# Patient Record
Sex: Female | Born: 1980
Health system: Southern US, Community
[De-identification: ages and names within clinical notes are randomized; demographics above are authoritative.]

## PROBLEM LIST (undated history)

## (undated) ENCOUNTER — Inpatient Hospital Stay (HOSPITAL_COMMUNITY): Payer: Self-pay

## (undated) DIAGNOSIS — O24419 Gestational diabetes mellitus in pregnancy, unspecified control: Secondary | ICD-10-CM

## (undated) DIAGNOSIS — F329 Major depressive disorder, single episode, unspecified: Secondary | ICD-10-CM

## (undated) DIAGNOSIS — K219 Gastro-esophageal reflux disease without esophagitis: Secondary | ICD-10-CM

## (undated) DIAGNOSIS — R87619 Unspecified abnormal cytological findings in specimens from cervix uteri: Secondary | ICD-10-CM

## (undated) DIAGNOSIS — I10 Essential (primary) hypertension: Secondary | ICD-10-CM

## (undated) DIAGNOSIS — F32A Depression, unspecified: Secondary | ICD-10-CM

## (undated) DIAGNOSIS — R000 Tachycardia, unspecified: Secondary | ICD-10-CM

## (undated) DIAGNOSIS — F419 Anxiety disorder, unspecified: Secondary | ICD-10-CM

## (undated) DIAGNOSIS — IMO0002 Reserved for concepts with insufficient information to code with codable children: Secondary | ICD-10-CM

## (undated) DIAGNOSIS — Z8489 Family history of other specified conditions: Secondary | ICD-10-CM

## (undated) DIAGNOSIS — G971 Other reaction to spinal and lumbar puncture: Secondary | ICD-10-CM

## (undated) HISTORY — DX: Major depressive disorder, single episode, unspecified: F32.9

## (undated) HISTORY — DX: Essential (primary) hypertension: I10

## (undated) HISTORY — PX: PERIURETHRAL ABSCESS DRAINAGE: SUR423

## (undated) HISTORY — PX: LEEP: SHX91

## (undated) HISTORY — DX: Gestational diabetes mellitus in pregnancy, unspecified control: O24.419

## (undated) HISTORY — DX: Depression, unspecified: F32.A

## (undated) HISTORY — DX: Anxiety disorder, unspecified: F41.9

## (undated) HISTORY — PX: WISDOM TOOTH EXTRACTION: SHX21

---
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
## 2002-03-04 ENCOUNTER — Other Ambulatory Visit: Admission: RE | Admit: 2002-03-04 | Discharge: 2002-03-04 | Payer: Self-pay | Admitting: Obstetrics and Gynecology

## 2003-04-18 ENCOUNTER — Emergency Department (HOSPITAL_COMMUNITY): Admission: EM | Admit: 2003-04-18 | Discharge: 2003-04-18 | Payer: Self-pay | Admitting: Emergency Medicine

## 2004-01-02 ENCOUNTER — Emergency Department (HOSPITAL_COMMUNITY): Admission: EM | Admit: 2004-01-02 | Discharge: 2004-01-02 | Payer: Self-pay | Admitting: Emergency Medicine

## 2004-02-28 ENCOUNTER — Emergency Department (HOSPITAL_COMMUNITY): Admission: EM | Admit: 2004-02-28 | Discharge: 2004-02-29 | Payer: Self-pay | Admitting: Emergency Medicine

## 2004-03-02 ENCOUNTER — Emergency Department (HOSPITAL_COMMUNITY): Admission: EM | Admit: 2004-03-02 | Discharge: 2004-03-02 | Payer: Self-pay | Admitting: Emergency Medicine

## 2004-03-10 ENCOUNTER — Emergency Department (HOSPITAL_COMMUNITY): Admission: EM | Admit: 2004-03-10 | Discharge: 2004-03-10 | Payer: Self-pay | Admitting: Emergency Medicine

## 2007-03-15 ENCOUNTER — Emergency Department: Payer: Self-pay | Admitting: Emergency Medicine

## 2007-03-17 ENCOUNTER — Emergency Department (HOSPITAL_COMMUNITY): Admission: EM | Admit: 2007-03-17 | Discharge: 2007-03-17 | Payer: Self-pay | Admitting: Emergency Medicine

## 2007-03-20 ENCOUNTER — Emergency Department (HOSPITAL_COMMUNITY): Admission: EM | Admit: 2007-03-20 | Discharge: 2007-03-20 | Payer: Self-pay | Admitting: Emergency Medicine

## 2010-09-24 NOTE — Discharge Summary (Signed)
[facility]  Patient:    Tami Foster, Tami Foster Visit Number: [id number] MRN: [id number]          Service Type: PSY Location: 10 0100 01 Attending Physician:  Veneta Penton. Admit Date:  01/12/2001 Discharge Date: 01/14/2001                             Discharge Summary  IDENTIFICATION:  Tami Foster was a 30 year old female.  INITIAL ASSESSMENT AND DIAGNOSIS:  [name] was admitted to the hospital after taking an overdose of Xanax.  She said she took about three tablets.  She was at a friends house and had to be picked up because she was acting erratic, appearing to be drunk, and slurring her speech.  She was taken to the hospital and subsequently admitted.  She said she was not suicidal at the time, but had been upset.  She and her boyfriend that she had been off and on boyfriend/girlfriend with for the last year or so, had had a conversation with earlier in the week and were planning to get together.  She said she was tired and could not go out with him.  That night, he got killed in an accident.  She said it had been very hard dealing with that.  That was about a week prior to admission.  She found herself getting irritable, angry, and snapping at people.  She had an argument at home and overreacted to that, and left home to be with her friend.  By the time of talking with her in the hospital, she said in spite of her unhappiness, she had a lot to live for and has had no interest in dying.  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  She was looking forward to college.  She got along with her parents and was close to her mother.  MENTAL STATUS EXAMINATION:  At the time of the initial evaluation, revealed and alert, oriented young woman who came to the interview willingly and was cooperative.  She was appropriately dressed and groomed.  She denied any suicidal thoughts.  She admitted to taking the extra Xanax, wanting just to escape her feelings,  she said.  There was no evidence of any psychosis.  Short and long-term memory were intact.  Judgment currently seemed adequate. Insight was minimal, intellectual functioning seemed at least average. Concentration was adequate.  OTHER PERTINENT HISTORY:  Can be obtained from the psychosocial service summary.  PHYSICAL EXAMINATION:  Within normal limits.  ADMISSION DIAGNOSES: Axis I:    1. Depressive disorder, not otherwise specified.            2. Grief reaction. Axis II:   Deferred. Axis III:  Healthy. Axis IV:   Severe. Axis V:    50/75.  FINDINGS:  All indicate her laboratory examinations were within normal limits or were noncontributory.  HOSPITAL COURSE:  Kynedi was in the hospital briefly.  She consistently denied any suicidal thoughts.  Initially, she focused mostly on just getting out of the hospital.  Her mother indicated she wanted her to at least take some responsibility for her behavior and talked about what she needed to do to change.  Her mother was understanding of what she was going through, but at the same time said this way of handling it was not necessary and wanted Sarin to do some thinking.  Rebekka did talk with her mother, did talk with staff, and at  least seemed to be taking some responsibility.  I was confident she was not suicidal as was her mother, and consequently, she was discharged to home.  POST HOSPITAL CARE PLAN:  She will follow up with her mothers EAP.  She was not given any medications in the hospital, nor prescribed any at the time of discharge.  There were no restrictions placed on her activity or her diet.  FINAL DIAGNOSES: Axis I:    Adjustment disorder with mixed emotional disturbance. Axis II:   Deferred. Axis III:  Healthy. Axis IV:   Severe. Axis V:    55. Attending Physician:  Veneta Penton DD:  01/23/01 TD:  01/23/01 Job: [id number]

## 2010-09-24 NOTE — H&P (Signed)
[facility]  Patient:    Tami Foster, LANGILL Visit Number: [id number] MRN: [id number]          Service Type: Attending:  Carolanne Grumbling, M.D. Dictated by:   Carolanne Grumbling, M.D. Adm. Date:  01/12/01                     Psychiatric Admission Assessment  IDENTIFICATION:  Tami Foster is a 30 year old female.  CHIEF COMPLAINT:  Tami Foster was admitted to the hospital after apparently taking Xanax.  She says she thinks she took about three tablets.  She was at a friends house and had to be picked up by her parents because she was appearing to be drunk and slurring her speech.  They brought her to the hospital and she was subsequently admitted.  HISTORY OF PRESENT ILLNESS:  Tami Foster says she was not suicidal but she says she has been upset.  Her boyfriend, that she has been off and on with for the last year or so, was killed in an accident.  She was supposed to go out with him that night.  They had been apart for awhile and he asked her to go out and she said it was too late but to call her the next day.  That very night, apparently, he got killed and she said it has been very hard dealing with that.  She has missed school for most of last week because of it.  She has found herself being irritable and angry and snapping at people.  She says she did have an argument and then she overreacted and left the home apparently to be with the friend.  She said she did not intend to take an overdose and she has no interest in dying.  Even though she is upset, she has plenty to live for.  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  She wants to go to college.  She says she gets along with her mother and father. She is very close to her mother.  She does not particularly like her father. She says he is lazy and does not work and she resents that every day.  She has an older sister, age 11, an older brother, age 60, and two older half-sisters on her  fathers side.  She is close to the sister particularly.  Not that close to the brother and has a reasonable relationship with her half-sisters and their children.  She denied any history of abuse, physically or sexually.  PREVIOUS PSYCHIATRIC TREATMENT:  None was reported.  MEDICAL PROBLEMS/ALLERGIES/MEDICATIONS:  She reported no medical problems.  No known allergies.  She takes birth control pills.  LEGAL/SUBSTANCE ABUSE ISSUES:  She denied any legal issues.  She has tried alcohol and marijuana but does not use either regularly.  She is a regular smoker of cigarettes.  MENTAL STATUS:  At the time of the initial evaluation revealed and alert, oriented, young woman, who came to the interview willingly and was cooperative.  Her main focus was on getting out of the hospital.  She sees no need to be here and cannot see why she even has to stay one more day.  She denied any suicidal thoughts prior to admission or since.  She did not display any evidence of any psychotic thinking or behavior.  Short and long-term memory were intact as measured by her ability to recall recent and remote events in her own life.  Her judgment currently seemed adequate.  Insight was minimal.  Intellectual functioning seemed at least average.  She admitted to feeling depressed, she said, even before the boyfriend got killed in an accident but she has been even more depressed since that time but she understands that is the way grief goes.  She was tearful throughout the session and says she does not know why she feels so bad.  ASSETS:  Tami Foster is intelligent and talkative.  ADMITTING DIAGNOSES: Axis I:    1. Depressive disorder not otherwise specified.            2. Grief. Axis II:   Deferred. Axis III:  Healthy. Axis IV:   Severe. Axis V:    50/75.  ESTIMATED LENGTH OF STAY:  Three days.  PLAN:  To stabilize to the point where she has a plan for dealing with her stress more effectively and a plan that is  acceptable to her parents. Dictated by:   Carolanne Grumbling, M.D. Attending:  Carolanne Grumbling, M.D. DD:  01/13/01 TD:  01/13/01 Job: [id number]

## 2010-11-18 ENCOUNTER — Emergency Department (HOSPITAL_COMMUNITY): Payer: Self-pay

## 2010-11-18 ENCOUNTER — Emergency Department (HOSPITAL_COMMUNITY)
Admission: EM | Admit: 2010-11-18 | Discharge: 2010-11-18 | Disposition: A | Discharge status: Home / Self Care | Payer: Self-pay | Attending: Emergency Medicine | Admitting: Emergency Medicine

## 2010-11-18 DIAGNOSIS — M25529 Pain in unspecified elbow: Secondary | ICD-10-CM | POA: Insufficient documentation

## 2010-11-18 DIAGNOSIS — L255 Unspecified contact dermatitis due to plants, except food: Secondary | ICD-10-CM | POA: Insufficient documentation

## 2010-11-18 DIAGNOSIS — S5000XA Contusion of unspecified elbow, initial encounter: Secondary | ICD-10-CM | POA: Insufficient documentation

## 2010-11-18 DIAGNOSIS — IMO0002 Reserved for concepts with insufficient information to code with codable children: Secondary | ICD-10-CM | POA: Insufficient documentation

## 2011-02-15 LAB — RAPID STREP SCREEN (MED CTR MEBANE ONLY): Streptococcus, Group A Screen (Direct): NEGATIVE

## 2012-04-14 ENCOUNTER — Inpatient Hospital Stay (HOSPITAL_COMMUNITY)
Admission: AD | Admit: 2012-04-14 | Discharge: 2012-04-15 | Disposition: A | Discharge status: Home / Self Care | Payer: Medicaid Other | Source: Ambulatory Visit | Attending: Obstetrics & Gynecology | Admitting: Obstetrics & Gynecology

## 2012-04-14 ENCOUNTER — Inpatient Hospital Stay (HOSPITAL_COMMUNITY): Payer: Medicaid Other

## 2012-04-14 ENCOUNTER — Encounter (HOSPITAL_COMMUNITY): Payer: Self-pay | Admitting: *Deleted

## 2012-04-14 DIAGNOSIS — O200 Threatened abortion: Secondary | ICD-10-CM | POA: Insufficient documentation

## 2012-04-14 LAB — HCG, QUANTITATIVE, PREGNANCY: hCG, Beta Chain, Quant, S: 510 m[IU]/mL — ABNORMAL HIGH (ref <5)

## 2012-04-14 LAB — CBC
HCT: 38.3 % (ref 36.0–46.0)
MCH: 31.5 pg (ref 26.0–34.0)
Platelets: 224 10*3/uL (ref 150–400)
RBC: 4.06 MIL/uL (ref 3.87–5.11)
WBC: 9.6 10*3/uL (ref 4.0–10.5)

## 2012-04-14 LAB — URINALYSIS, ROUTINE W REFLEX MICROSCOPIC
Bilirubin Urine: NEGATIVE
Glucose, UA: NEGATIVE mg/dL
Protein, ur: NEGATIVE mg/dL
Specific Gravity, Urine: 1.02 (ref 1.005–1.030)
Urobilinogen, UA: 0.2 mg/dL (ref 0.0–1.0)

## 2012-04-14 LAB — URINE MICROSCOPIC-ADD ON

## 2012-04-14 LAB — POCT PREGNANCY, URINE: Preg Test, Ur: POSITIVE — AB

## 2012-04-14 NOTE — MAU Note (Signed)
Today at 1300 had brown d/c. About 2000 saw alittle bright blood but now there is more blood

## 2012-04-14 NOTE — MAU Provider Note (Signed)
History     CSN: [id number]  Arrival date and time: 04/14/12 2049   First Provider Initiated Contact with Patient 04/14/12 2212      Chief Complaint  Patient presents with  . Vaginal Bleeding  . Abdominal Cramping   HPI  Tami Foster is a 31 y.o. G1P0 who presents today with vaginal bleeding. She has some brown spotting after intercourse yesterday. Today the bleeding has increased. She is having some mild lower back pain 5/10. She has not taken anything for it. She saw the RN at [facility] yesterday. She had routine prenatal labs done at that visit.   History reviewed. No pertinent past medical history.  Past Surgical History  Procedure Date  . Periurethral abscess drainage     abscess removed from tonsils. Tonsils not removed  . Leep     Family History  Problem Relation Age of Onset  . Other Neg Hx     History  Substance Use Topics  . Smoking status: Current Some Day Smoker  . Smokeless tobacco: Not on file  . Alcohol Use: No    Allergies: No Known Allergies  Prescriptions prior to admission  Medication Sig Dispense Refill  . calcium carbonate (TUMS - DOSED IN MG ELEMENTAL CALCIUM) 500 MG chewable tablet Chew 2 tablets by mouth daily as needed. For heartburn      . Prenatal Vit-Fe Fumarate-FA (PRENATAL MULTIVITAMIN) TABS Take 1 tablet by mouth daily.        Review of Systems  Constitutional: Negative for fever.  Gastrointestinal: Positive for nausea. Negative for heartburn, vomiting, abdominal pain, diarrhea and constipation.  Genitourinary: Negative for dysuria, urgency, frequency, hematuria and flank pain.  Musculoskeletal: Negative for myalgias.  Neurological: Negative for headaches.   Physical Exam   Blood pressure 119/80, pulse 101, temperature 98.1 F (36.7 C), temperature source Oral, resp. rate 20, height 5\' 2"  (1.575 m), weight 70.489 kg (155 lb 6.4 oz), last menstrual period 03/01/2012.  Physical Exam  Nursing note and vitals  reviewed. Constitutional: She is oriented to person, place, and time. She appears well-developed and well-nourished.  HENT:  Head: Normocephalic.  Cardiovascular: Normal rate.   Respiratory: Effort normal.  GI: Soft.  Neurological: She is alert and oriented to person, place, and time.  Skin: Skin is warm and dry.  Psychiatric: She has a normal mood and affect.    MAU Course  Procedures  *RADIOLOGY REPORT*  Clinical Data: Vaginal bleeding. 6 weeks and 2 days pregnant by  last menstrual period. Quantitative beta HCG 510.  OBSTETRIC <14 WK [place] AND TRANSVAGINAL OB US  Technique: Both transabdominal and transvaginal ultrasound  examinations were performed for complete evaluation of the  gestation as well as the maternal uterus, adnexal regions, and  pelvic cul-de-sac. Transvaginal technique was performed to assess  early pregnancy.  Comparison: None.  Intrauterine gestational sac: Not visualized.  Yolk sac: Not visualized.  Embryo: Not visualized.  Maternal uterus/adnexae:  Small linear, hypoechoic area within the endometrial canal. No  adnexal masses or free peritoneal fluid. Normal appearing ovaries.  IMPRESSION:  Minimal blood within the endometrial canal with no intrauterine or  ectopic pregnancy visualized. Correlation with serial quantitative  beta HCG determinations is recommended. If the beta HCG rises, a  followup ultrasound would be recommended in 2 weeks.  Original Report Authenticated By: Beckie Salts, M.D.  2300: Spoke with Dr. Tamela Oddi. OK for DC home and FU with the office next week.   Assessment and Plan   1. Threatened abortion in  first trimester    Discussed SAB precautions/bleeding precautions FU with Dr. Tamela Oddi next week.   Tawnya Crook 04/14/2012, 10:17 PM

## 2012-04-15 LAB — ABO/RH: ABO/RH(D): B POS

## 2012-04-15 NOTE — MAU Note (Signed)
Zorita Pang CNM in to discuss u/s results and d/c plan.

## 2012-04-15 NOTE — Progress Notes (Signed)
Written and verbal d/c instructions given and understanding voiced. 

## 2012-05-09 NOTE — L&D Delivery Note (Signed)
Delivery Note At 5:01 AM a viable female was delivered via Vaginal, Spontaneous Delivery (Presentation: LOA; compound presentation with posterior arm ).  APGAR: 9, 9 .   Placenta status: Intact, Spontaneous Pathology.  Cord: 3 vessels Anesthesia: Epidural  Episiotomy: None Lacerations: 1st degree, labial  Suture Repair: 2.0 vicryl rapide Est. Blood Loss (mL): 200 ml  Mom to postpartum.  Baby to NICU.  Foster,Tami Calvo A 02/28/2013, 5:38 AM

## 2012-08-03 ENCOUNTER — Telehealth: Payer: Self-pay | Admitting: *Deleted

## 2012-08-03 NOTE — Telephone Encounter (Signed)
Pt called requesting an appointment- she has h/o miscarriage.  Call to patient - left massage to call back.

## 2012-08-08 ENCOUNTER — Inpatient Hospital Stay (HOSPITAL_COMMUNITY)
Admission: AD | Admit: 2012-08-08 | Discharge: 2012-08-08 | Disposition: A | Discharge status: Home / Self Care | Payer: Medicaid Other | Source: Ambulatory Visit | Attending: Obstetrics & Gynecology | Admitting: Obstetrics & Gynecology

## 2012-08-08 ENCOUNTER — Inpatient Hospital Stay (HOSPITAL_COMMUNITY): Payer: Medicaid Other

## 2012-08-08 ENCOUNTER — Encounter (HOSPITAL_COMMUNITY): Payer: Self-pay | Admitting: *Deleted

## 2012-08-08 DIAGNOSIS — O200 Threatened abortion: Secondary | ICD-10-CM

## 2012-08-08 DIAGNOSIS — R109 Unspecified abdominal pain: Secondary | ICD-10-CM | POA: Insufficient documentation

## 2012-08-08 HISTORY — DX: Unspecified abnormal cytological findings in specimens from cervix uteri: R87.619

## 2012-08-08 HISTORY — DX: Reserved for concepts with insufficient information to code with codable children: IMO0002

## 2012-08-08 LAB — URINALYSIS, ROUTINE W REFLEX MICROSCOPIC
Hgb urine dipstick: NEGATIVE
Leukocytes, UA: NEGATIVE
pH: 6.5 (ref 5.0–8.0)

## 2012-08-08 LAB — CBC
HCT: 39.1 % (ref 36.0–46.0)
MCH: 31 pg (ref 26.0–34.0)
MCHC: 33.8 g/dL (ref 30.0–36.0)
MCV: 91.8 fL (ref 78.0–100.0)
RBC: 4.26 MIL/uL (ref 3.87–5.11)
RDW: 12.9 % (ref 11.5–15.5)

## 2012-08-08 LAB — WET PREP, GENITAL
Trich, Wet Prep: NONE SEEN
Yeast Wet Prep HPF POC: NONE SEEN

## 2012-08-08 LAB — HCG, QUANTITATIVE, PREGNANCY: hCG, Beta Chain, Quant, S: 6044 m[IU]/mL — ABNORMAL HIGH (ref <5)

## 2012-08-08 NOTE — MAU Note (Signed)
Pt reports lower abd cramping and brown discharge this pm. States also having lower back pain.

## 2012-08-08 NOTE — MAU Provider Note (Signed)
History     CSN: [id number]  Arrival date and time: 08/08/12 7829   First Provider Initiated Contact with Patient 08/08/12 2053      Chief Complaint  Patient presents with  . Abdominal Pain   HPI  NIALAH SARAVIA is a 32 y.o. G2P0010 at [redacted]w[redacted]d who presents today with some brown spotting since earlier today. She had a +UPT on 2/24, and had tested earlier in that month with a negative. So, she feels that she is not as far along as the dates listed from her LMP. She denies pain. She has had some occasional cramps that she thinks has been gas.   Past Medical History  Diagnosis Date  . Abnormal Pap smear     Past Surgical History  Procedure Laterality Date  . Periurethral abscess drainage      abscess removed from tonsils. Tonsils not removed  . Leep      Family History  Problem Relation Age of Onset  . Other Neg Hx     History  Substance Use Topics  . Smoking status: Current Some Day Smoker  . Smokeless tobacco: Not on file  . Alcohol Use: No    Allergies: No Known Allergies  Prescriptions prior to admission  Medication Sig Dispense Refill  . calcium carbonate (TUMS - DOSED IN MG ELEMENTAL CALCIUM) 500 MG chewable tablet Chew 2 tablets by mouth daily as needed. For heartburn      . Prenatal Vit-Fe Fumarate-FA (PRENATAL MULTIVITAMIN) TABS Take 1 tablet by mouth daily.        Review of Systems  Constitutional: Negative for fever and chills.  Eyes: Negative for blurred vision.  Gastrointestinal: Positive for nausea, constipation and blood in stool (from straining). Negative for vomiting, abdominal pain and diarrhea.  Genitourinary: Negative for dysuria, urgency and frequency.  Neurological: Negative for dizziness and headaches.   Physical Exam   Blood pressure 128/82, pulse 103, temperature 98.8 F (37.1 C), temperature source Oral, resp. rate 18, height 5\' 2"  (1.575 m), weight 68.947 kg (152 lb), last menstrual period 05/27/2012, SpO2 100.00%, unknown if  currently breastfeeding.  Physical Exam  Nursing note and vitals reviewed. Constitutional: She is oriented to person, place, and time. She appears well-developed and well-nourished. No distress.  Cardiovascular: Normal rate.   Respiratory: Effort normal.  GI: Soft. She exhibits no distension. There is no tenderness.  Neurological: She is alert and oriented to person, place, and time.  Skin: Skin is warm and dry.  Psychiatric: She has a normal mood and affect.    MAU Course  Procedures Results for orders placed during the hospital encounter of 08/08/12 (from the past 24 hour(s))  URINALYSIS, ROUTINE W REFLEX MICROSCOPIC     Status: None   Collection Time    08/08/12  8:01 PM      Result Value Range   Color, Urine YELLOW  YELLOW   APPearance CLEAR  CLEAR   Specific Gravity, Urine 1.020  1.005 - 1.030   pH 6.5  5.0 - 8.0   Glucose, UA NEGATIVE  NEGATIVE mg/dL   Hgb urine dipstick NEGATIVE  NEGATIVE   Bilirubin Urine NEGATIVE  NEGATIVE   Ketones, ur NEGATIVE  NEGATIVE mg/dL   Protein, ur NEGATIVE  NEGATIVE mg/dL   Urobilinogen, UA 0.2  0.0 - 1.0 mg/dL   Nitrite NEGATIVE  NEGATIVE   Leukocytes, UA NEGATIVE  NEGATIVE  POCT PREGNANCY, URINE     Status: Abnormal   Collection Time    08/08/12  8:19 PM      Result Value Range   Preg Test, Ur POSITIVE (*) NEGATIVE  CBC     Status: Abnormal   Collection Time    08/08/12  8:45 PM      Result Value Range   WBC 11.5 (*) 4.0 - 10.5 K/uL   RBC 4.26  3.87 - 5.11 MIL/uL   Hemoglobin 13.2  12.0 - 15.0 g/dL   HCT 16.1  09.6 - 04.5 %   MCV 91.8  78.0 - 100.0 fL   MCH 31.0  26.0 - 34.0 pg   MCHC 33.8  30.0 - 36.0 g/dL   RDW 40.9  81.1 - 91.4 %   Platelets 231  150 - 400 K/uL  HCG, QUANTITATIVE, PREGNANCY     Status: Abnormal   Collection Time    08/08/12  8:45 PM      Result Value Range   hCG, Beta Chain, Quant, S 6044 (*) <5 mIU/mL  WET PREP, GENITAL     Status: Abnormal   Collection Time    08/08/12  9:02 PM      Result Value  Range   Yeast Wet Prep HPF POC NONE SEEN  NONE SEEN   Trich, Wet Prep NONE SEEN  NONE SEEN   Clue Cells Wet Prep HPF POC FEW (*) NONE SEEN   WBC, Wet Prep HPF POC FEW (*) NONE SEEN    RADIOLOGY REPORT*  Clinical Data: Spotting and cramping. Estimated gestational age by  LMP is 10 weeks 3 days but unsure of dates. Quantitative beta HCG  is pending.  OBSTETRIC <14 WK [place] AND TRANSVAGINAL OB US  Technique: Both transabdominal and transvaginal ultrasound  examinations were performed for complete evaluation of the  gestation as well as the maternal uterus, adnexal regions, and  pelvic cul-de-sac. Transvaginal technique was performed to assess  early pregnancy.  Comparison: None.  Intrauterine gestational sac: A single intrauterine gestational  sac is visualized. Contours are intact.  Yolk sac: The yolk sac is visualized.  Embryo: No fetal pole is visualized.  Cardiac Activity: No fetal cardiac activity visualized.  MSD: 8.6 mm 5 w 4 d [place] EDC: 04/06/2013  Maternal uterus/adnexae:  The uterus is anteverted. No focal myometrial mass lesions. No  subchorionic hemorrhage. Small Nabothian cyst in the cervix.  The left ovary measures 3.8 x 2.1 x 1.6 cm. Normal follicular  changes are demonstrated.  The right ovary measures 2.1 x 4.2 x 3.0 cm. Normal follicular  changes are demonstrated. No abnormal adnexal masses. No free  pelvic fluid collections.  IMPRESSION:  Probable early intrauterine gestational sac with visualized yolk  sac. Estimated gestational age by mean sac diameter is 5 weeks 4  days. No fetal pole or cardiac activity yet visualized. Recommend  follow-up quantitative B-HCG levels and follow-up US in 14 days to  confirm and assess viability. This recommendation follows SRU  consensus guidelines: Diagnostic Criteria for Nonviable Pregnancy  Early in the First Trimester. Malva Limes Med 2013; 782:9562-13.  Original Report Authenticated By: Burman Nieves, M.D.  2149: Spoke with  Dr. Tamela Oddi. Ok for DC home have patient call for an appointment for 1 week to repeat the ultrasound  Assessment and Plan   1. Threatened abortion in first trimester    FU with Dr. Tina Griffiths in 1 week 1st trimester danger signs reviewed.   Tawnya Crook 08/08/2012, 8:57 PM

## 2012-08-09 LAB — GC/CHLAMYDIA PROBE AMP: CT Probe RNA: NEGATIVE

## 2012-08-10 ENCOUNTER — Encounter: Payer: Self-pay | Admitting: *Deleted

## 2012-08-13 ENCOUNTER — Ambulatory Visit (INDEPENDENT_AMBULATORY_CARE_PROVIDER_SITE_OTHER): Payer: Medicaid Other | Admitting: Obstetrics & Gynecology

## 2012-08-13 ENCOUNTER — Encounter: Payer: Self-pay | Admitting: Obstetrics & Gynecology

## 2012-08-13 VITALS — BP 126/83 | Temp 98.9°F | Wt 152.0 lb

## 2012-08-13 DIAGNOSIS — Z3401 Encounter for supervision of normal first pregnancy, first trimester: Secondary | ICD-10-CM

## 2012-08-13 DIAGNOSIS — Z3400 Encounter for supervision of normal first pregnancy, unspecified trimester: Secondary | ICD-10-CM | POA: Insufficient documentation

## 2012-08-13 DIAGNOSIS — Z3201 Encounter for pregnancy test, result positive: Secondary | ICD-10-CM

## 2012-08-13 MED ORDER — BUPROPION HCL ER (SMOKING DET) 150 MG PO TB12: 150.0000 mg | ORAL_TABLET | Freq: Two times a day (BID) | ORAL | Status: DC

## 2012-08-13 NOTE — Progress Notes (Signed)
P 105 Pt reports she has had some cramping and pink discharge at times. Pt was seen at [facility]- [place], pelvic exam, labs- Hcg 6044 (08/08/2012), GC/Ch- neg, wet prep- negative Subjective:    Tami Foster is being seen today for her first obstetrical visit.  This is not a planned pregnancy. She is at [redacted]w[redacted]d gestation. Her obstetrical history is significant for smoker. Relationship with FOB: significant other, not living together Patient does intend to breast feed. Pregnancy history fully reviewed.  Menstrual History: OB History   Grav Para Term Preterm Abortions TAB SAB Ect Mult Living   2    1  1          Menarche age:32 Patient's last menstrual period was 05/27/2012.    The following portions of the patient's history were reviewed and updated as appropriate: allergies, current medications, past family history, past medical history, past social history, past surgical history and problem list.  Review of Systems Pertinent items are noted in HPI.    Objective:    General appearance: alert Abdomen: soft, non-tender; bowel sounds normal; no masses,  no organomegaly Pelvic: cervix normal in appearance, external genitalia normal, rectovaginal septum normal, uterus normal size, shape, and consistency and vagina normal without discharge    Assessment:    Early pregnancy at [redacted]w[redacted]d  H/O recent SAB   Plan:    Initial labs drawn. Prenatal vitamins. Problem list reviewed and updated. Follow up in 1 weeks. 50% of 20 min visit spent on counseling and coordination of care.

## 2012-08-13 NOTE — Progress Notes (Signed)
U/S informal CRL 6w

## 2012-08-13 NOTE — Patient Instructions (Signed)

## 2012-08-14 LAB — OBSTETRIC PANEL
Basophils Absolute: 0 10*3/uL (ref 0.0–0.1)
Hepatitis B Surface Ag: NEGATIVE
Lymphocytes Relative: 26 % (ref 12–46)
Lymphs Abs: 2.4 10*3/uL (ref 0.7–4.0)
MCV: 89.2 fL (ref 78.0–100.0)
Neutro Abs: 6.3 10*3/uL (ref 1.7–7.7)
Neutrophils Relative %: 67 % (ref 43–77)
Platelets: 245 10*3/uL (ref 150–400)
RBC: 4.65 MIL/uL (ref 3.87–5.11)
RDW: 13.3 % (ref 11.5–15.5)
Rubella: 7.42 Index — ABNORMAL HIGH (ref <0.90)
WBC: 9.3 10*3/uL (ref 4.0–10.5)

## 2012-08-14 LAB — PAP IG, CT-NG, RFX HPV ASCU
Chlamydia Probe Amp: NEGATIVE
GC Probe Amp: NEGATIVE

## 2012-08-14 LAB — VITAMIN D 25 HYDROXY (VIT D DEFICIENCY, FRACTURES): Vit D, 25-Hydroxy: 30 ng/mL (ref 30–89)

## 2012-08-15 ENCOUNTER — Encounter: Payer: Self-pay | Admitting: Obstetrics & Gynecology

## 2012-08-15 ENCOUNTER — Encounter (HOSPITAL_COMMUNITY): Payer: Self-pay | Admitting: *Deleted

## 2012-08-15 ENCOUNTER — Inpatient Hospital Stay (HOSPITAL_COMMUNITY): Payer: Medicaid Other

## 2012-08-15 ENCOUNTER — Inpatient Hospital Stay (HOSPITAL_COMMUNITY)
Admission: AD | Admit: 2012-08-15 | Discharge: 2012-08-15 | Disposition: A | Discharge status: Home / Self Care | Payer: Medicaid Other | Source: Ambulatory Visit | Attending: Obstetrics & Gynecology | Admitting: Obstetrics & Gynecology

## 2012-08-15 DIAGNOSIS — O4590 Premature separation of placenta, unspecified, unspecified trimester: Secondary | ICD-10-CM

## 2012-08-15 DIAGNOSIS — O4591 Premature separation of placenta, unspecified, first trimester: Secondary | ICD-10-CM

## 2012-08-15 DIAGNOSIS — O209 Hemorrhage in early pregnancy, unspecified: Secondary | ICD-10-CM | POA: Insufficient documentation

## 2012-08-15 NOTE — MAU Note (Addendum)
States was seen in MAU last week with some brown spotting. States was seen in MD office 2 days ago for F/U. States all was fine at that time. Had U/S and was normal. Today around 1330 had sudden onset of vaginal bleeding. Bled through [employer].  No cramping. Small amount of blood on pad now.

## 2012-08-15 NOTE — MAU Provider Note (Signed)
History     CSN: [id number]  Arrival date and time: 08/15/12 1358   None     Chief Complaint  Patient presents with  . Vaginal Bleeding   HPI [name] is a 32 y.o. G2P0010 at [redacted]w[redacted]d who presents to MAU today with complaint of bleeding. The patient states that she had "a lot" of bleeding that started suddenly about an hour ago. The patient soaked through her clothing. She denies pain, N/V or fever. She states that she has not passed any clots. She had [place] confirming viability on 08/08/12 and was seen in the office today with no problems at the time.   OB History   Grav Para Term Preterm Abortions TAB SAB Ect Mult Living   2    1  1    0      Past Medical History  Diagnosis Date  . Abnormal Pap smear     Past Surgical History  Procedure Laterality Date  . Periurethral abscess drainage      abscess removed from tonsils. Tonsils not removed  . Leep      Family History  Problem Relation Age of Onset  . Other Neg Hx   . Bladder Cancer    . Heart disease    . Cancer Mother   . Hypertension Mother   . Emphysema Father   . Diabetes Paternal Grandmother     History  Substance Use Topics  . Smoking status: Current Some Day Smoker -- 0.50 packs/day for 14 years    Types: Cigarettes  . Smokeless tobacco: Never Used  . Alcohol Use: No    Allergies: No Known Allergies  Prescriptions prior to admission  Medication Sig Dispense Refill  . Prenatal Vit-Fe Fumarate-FA (PRENATAL MULTIVITAMIN) TABS Take 1 tablet by mouth daily.      Marland Kitchen buPROPion (ZYBAN) 150 MG 12 hr tablet Take 1 tablet (150 mg total) by mouth 2 (two) times daily.  60 tablet  1    Review of Systems  Constitutional: Negative for fever and malaise/fatigue.  Gastrointestinal: Positive for nausea. Negative for vomiting and abdominal pain.  Genitourinary: Negative for dysuria, urgency and frequency.       + vaginal bleeding  Neurological: Negative for dizziness and loss of consciousness.   Physical Exam    Blood pressure 111/70, pulse 116, temperature 97.6 F (36.4 C), temperature source Oral, resp. rate 18, height 5\' 2"  (1.575 m), weight 152 lb (68.947 kg), last menstrual period 05/27/2012, SpO2 99.00%, unknown if currently breastfeeding.  Physical Exam  Constitutional: She is oriented to person, place, and time. She appears well-developed and well-nourished. No distress.  HENT:  Head: Normocephalic and atraumatic.  Cardiovascular: Normal rate, regular rhythm and normal heart sounds.   Respiratory: Effort normal and breath sounds normal. No respiratory distress.  GI: Soft. Bowel sounds are normal. She exhibits no distension and no mass. There is no tenderness. There is no rebound and no guarding.  Genitourinary: Uterus is not enlarged and not tender. Cervix exhibits no motion tenderness, no discharge and no friability. Right adnexum displays no mass and no tenderness. Left adnexum displays no mass and no tenderness. There is bleeding (small amount of bleeding with one small clot noted at the cervical os) around the vagina. No erythema or tenderness around the vagina. No vaginal discharge found.  Neurological: She is alert and oriented to person, place, and time.  Skin: Skin is warm and dry. No erythema.  Psychiatric: She has a normal mood and affect.  MAU Course  Procedures None  MDM [place] today shows small subchorionic hemorrhage, +IUP with cardiac activity and appropriate size.   Assessment and Plan  A: Subchorionic hemorrhage  P: Discharge home Bleeding precautions discussed Patient advised to keep follow-up with Dr. Tamela Oddi as scheduled Patient may return to MAU as needed or if her condition were to change or worsen  Freddi Starr, PA-C   08/15/2012, 2:27 PM

## 2012-08-20 ENCOUNTER — Telehealth: Payer: Self-pay | Admitting: *Deleted

## 2012-08-21 ENCOUNTER — Encounter: Payer: Self-pay | Admitting: Obstetrics

## 2012-08-21 ENCOUNTER — Other Ambulatory Visit: Payer: Self-pay | Admitting: Obstetrics & Gynecology

## 2012-08-21 ENCOUNTER — Ambulatory Visit (INDEPENDENT_AMBULATORY_CARE_PROVIDER_SITE_OTHER): Payer: Medicaid Other | Admitting: Obstetrics

## 2012-08-21 ENCOUNTER — Ambulatory Visit (INDEPENDENT_AMBULATORY_CARE_PROVIDER_SITE_OTHER): Payer: Medicaid Other

## 2012-08-21 VITALS — BP 130/80 | Temp 98.6°F | Wt 153.0 lb

## 2012-08-21 DIAGNOSIS — Z3480 Encounter for supervision of other normal pregnancy, unspecified trimester: Secondary | ICD-10-CM

## 2012-08-21 DIAGNOSIS — Z3401 Encounter for supervision of normal first pregnancy, first trimester: Secondary | ICD-10-CM

## 2012-08-21 DIAGNOSIS — O200 Threatened abortion: Secondary | ICD-10-CM

## 2012-08-21 DIAGNOSIS — Z8759 Personal history of other complications of pregnancy, childbirth and the puerperium: Secondary | ICD-10-CM

## 2012-08-21 DIAGNOSIS — O3680X0 Pregnancy with inconclusive fetal viability, not applicable or unspecified: Secondary | ICD-10-CM

## 2012-08-21 LAB — US OB DETAIL + 14 WK

## 2012-08-21 LAB — POCT URINALYSIS DIPSTICK
Blood, UA: NEGATIVE
Glucose, UA: NEGATIVE
Leukocytes, UA: NEGATIVE
Nitrite, UA: NEGATIVE

## 2012-08-21 LAB — US OB COMP LESS 14 WKS

## 2012-08-21 NOTE — Progress Notes (Signed)
Pulse-100 Pt c/o brown vaginal discharge.

## 2012-08-21 NOTE — Telephone Encounter (Signed)
Called patient regarding message from [place].  Patient states that she went to [facility] and was told that she had a small subchorionic hemorrhage and wanted to know what she soudl be doing in the meantime.  Patient states that she is now only spotting brown blood.  Denies any pain or cramping.  She has an appointment on 08/21/12 to see Dr. Clearance Coots.  Advised patient to rest as much as she can and to drink extra fluids.

## 2012-08-22 ENCOUNTER — Encounter: Payer: Self-pay | Admitting: Obstetrics

## 2012-09-19 ENCOUNTER — Encounter (HOSPITAL_COMMUNITY): Payer: Self-pay | Admitting: Obstetrics & Gynecology

## 2012-09-19 ENCOUNTER — Ambulatory Visit (INDEPENDENT_AMBULATORY_CARE_PROVIDER_SITE_OTHER): Payer: Medicaid Other | Admitting: Obstetrics & Gynecology

## 2012-09-19 ENCOUNTER — Encounter: Payer: Self-pay | Admitting: Obstetrics & Gynecology

## 2012-09-19 ENCOUNTER — Other Ambulatory Visit: Payer: Self-pay | Admitting: Obstetrics & Gynecology

## 2012-09-19 VITALS — BP 123/73 | Temp 98.7°F | Wt 159.0 lb

## 2012-09-19 DIAGNOSIS — Z3400 Encounter for supervision of normal first pregnancy, unspecified trimester: Secondary | ICD-10-CM

## 2012-09-19 DIAGNOSIS — Z3682 Encounter for antenatal screening for nuchal translucency: Secondary | ICD-10-CM

## 2012-09-19 DIAGNOSIS — Z3401 Encounter for supervision of normal first pregnancy, first trimester: Secondary | ICD-10-CM

## 2012-09-19 LAB — POCT URINALYSIS DIPSTICK
Bilirubin, UA: NEGATIVE
Blood, UA: NEGATIVE
Glucose, UA: NEGATIVE
Ketones, UA: NEGATIVE
Nitrite, UA: NEGATIVE

## 2012-09-19 MED ORDER — PRENATAL MULTIVITAMIN CH: 1.0000 | ORAL_TABLET | Freq: Every day | ORAL | Status: DC

## 2012-09-19 NOTE — Patient Instructions (Signed)
CF Gene Mutation Testing This is a test used to detect cystic fibrosis (CF) genetic mutations to establish CF carrier status or to establish the diagnosis of CF in an individual. The CF gene mutation test identifies mutations in the CFTR gene on chromosome 7. Each cell in the human body (except sperm and eggs) has 46 chromosomes (23 inherited from the mother and 23 from the father). Genes on these chromosomes form the body's blueprint for producing proteins that control body functions. Cystic fibrosis is caused by a mutation in a pair of genes located on chromosomes 7. Both copies of this gene must be abnormal to cause CF. If only one copy of the gene pair is mutated, the patient will be a carrier. Carriers are not ill, they do not have any symptoms, but they can pass their abnormal CF gene copy on to their children.  When a newborn infant has meconium ileus (no stools in the first 24 to 48 hours of life) or when a person has symptoms of CF (salty sweat, persistent respiratory infections, wheezing, persistent diarrhea, foul-smelling greasy stools, malnutrition, and vitamin deficiency); if a person has a positive sweat chloride or IRT test or a close relative who has been diagnosed with CF; when a patient is undergoing genetic counseling and wants to find out if they are a CF carrier; or for prenatal diagnosis. PREPARATION FOR TEST A blood sample drawn from an infant's heel; a spot of blood that is put onto filter paper; or a blood sample is drawn from a vein in the arm. NORMAL FINDINGS No genetic mutation. Ranges for normal findings may vary among different laboratories and hospitals. You should always check with your doctor after having lab work or other tests done to discuss the meaning of your test results and whether your values are considered within normal limits. MEANING OF TEST Your caregiver will go over the test results with you and discuss the importance and meaning of your results, as well as  treatment options and the need for additional tests if necessary. OBTAINING THE TEST RESULTS It is your responsibility to obtain your test results. Ask the lab or department performing the test when and how you will get your results. Document Released: 05/19/2004 Document Revised: 07/18/2011 Document Reviewed: 04/02/2008 [employer] Patient Information 2013 [place], [place].

## 2012-09-19 NOTE — Progress Notes (Signed)
Cardiac activity on U/S. 

## 2012-09-19 NOTE — Progress Notes (Signed)
Pulse-105 

## 2012-09-20 ENCOUNTER — Encounter: Payer: Self-pay | Admitting: Obstetrics

## 2012-09-21 LAB — CYSTIC FIBROSIS DIAGNOSTIC STUDY

## 2012-09-24 ENCOUNTER — Inpatient Hospital Stay (HOSPITAL_COMMUNITY)
Admission: AD | Admit: 2012-09-24 | Discharge: 2012-09-24 | Disposition: A | Discharge status: Home / Self Care | Payer: Medicaid Other | Source: Ambulatory Visit | Attending: Obstetrics | Admitting: Obstetrics

## 2012-09-24 ENCOUNTER — Encounter (HOSPITAL_COMMUNITY): Payer: Self-pay | Admitting: *Deleted

## 2012-09-24 DIAGNOSIS — O43899 Other placental disorders, unspecified trimester: Secondary | ICD-10-CM | POA: Insufficient documentation

## 2012-09-24 DIAGNOSIS — O209 Hemorrhage in early pregnancy, unspecified: Secondary | ICD-10-CM | POA: Insufficient documentation

## 2012-09-24 DIAGNOSIS — O418X11 Other specified disorders of amniotic fluid and membranes, first trimester, fetus 1: Secondary | ICD-10-CM

## 2012-09-24 DIAGNOSIS — O368990 Maternal care for other specified fetal problems, unspecified trimester, not applicable or unspecified: Secondary | ICD-10-CM | POA: Insufficient documentation

## 2012-09-24 HISTORY — DX: Gastro-esophageal reflux disease without esophagitis: K21.9

## 2012-09-24 LAB — OB RESULTS CONSOLE GC/CHLAMYDIA
Chlamydia: NEGATIVE
Gonorrhea: NEGATIVE

## 2012-09-24 LAB — CBC
MCH: 31.4 pg (ref 26.0–34.0)
MCHC: 34.9 g/dL (ref 30.0–36.0)
Platelets: 199 10*3/uL (ref 150–400)
RBC: 4.08 MIL/uL (ref 3.87–5.11)

## 2012-09-24 LAB — WET PREP, GENITAL: Yeast Wet Prep HPF POC: NONE SEEN

## 2012-09-24 NOTE — MAU Note (Signed)
Pt states around 1730 used restroom and saw large amount of blood in toilet. Unsure if there were clots. Denies pain at present. Does have headache at present.

## 2012-09-24 NOTE — MAU Provider Note (Signed)
  History     CSN: [id number]  Arrival date and time: 09/24/12 4098   First Provider Initiated Contact with Patient 09/24/12 1932      Chief Complaint  Patient presents with  . Vaginal Bleeding   HPI  Tami Foster is a 32 y.o. G2P0010 at [redacted]w[redacted]d who presents today with vaginal bleeding. She states it started as a gush that soaked through her clothes. She states that she last had intercourse about a week ago. She denies any pain at this time. She states that occasional when she moves she feels sharp "twinges" of pain on her left side.   Past Medical History  Diagnosis Date  . Abnormal Pap smear   . GERD (gastroesophageal reflux disease)     Past Surgical History  Procedure Laterality Date  . Periurethral abscess drainage      abscess removed from tonsils. Tonsils not removed  . Leep    . Wisdom tooth extraction      Family History  Problem Relation Age of Onset  . Other Neg Hx   . Bladder Cancer    . Heart disease    . Cancer Mother   . Hypertension Mother   . Emphysema Father   . Diabetes Paternal Grandmother     History  Substance Use Topics  . Smoking status: Current Some Day Smoker -- 0.50 packs/day for 14 years    Types: Cigarettes  . Smokeless tobacco: Never Used  . Alcohol Use: No    Allergies: No Known Allergies  Prescriptions prior to admission  Medication Sig Dispense Refill  . Prenatal Vit-Fe Fumarate-FA (PRENATAL MULTIVITAMIN) TABS Take 1 tablet by mouth daily.  60 tablet  5  . buPROPion (ZYBAN) 150 MG 12 hr tablet Take 1 tablet (150 mg total) by mouth 2 (two) times daily.  60 tablet  1    Review of Systems  Constitutional: Negative for fever and chills.  Gastrointestinal: Positive for nausea, vomiting (every other day she vomits) and constipation. Negative for abdominal pain and diarrhea.  Genitourinary: Negative for dysuria, urgency and frequency.  Musculoskeletal: Negative for myalgias.  Neurological: Positive for headaches (x 2days. took  tylenol. She has a hx of headaches). Negative for dizziness.   Physical Exam   Blood pressure 135/81, pulse 100, temperature 98.9 F (37.2 C), temperature source Oral, resp. rate 16, height 5\' 2"  (1.575 m), weight 72.292 kg (159 lb 6 oz), last menstrual period 05/27/2012, SpO2 99.00%.  Physical Exam  Nursing note and vitals reviewed. Constitutional: She is oriented to person, place, and time. She appears well-developed and well-nourished. No distress.  Cardiovascular: Normal rate.   Respiratory: Effort normal.  GI: Soft. There is no tenderness.  Genitourinary:  External: no lesion Vagina: small amount of blood Cervix: pink, smooth, closed Uterus: NSSC Adnexa: NT  Neurological: She is alert and oriented to person, place, and time.  Skin: Skin is warm and dry.  Psychiatric: She has a normal mood and affect.   FHT: 164 via doppler MAU Course  Procedures   1952: Spoke with Dr. Clearance Coots, ok for dc home and FU in the office scheduled.  Assessment and Plan   1. Subchorionic hematoma, antepartum, first trimester, fetus 1    Bleeding precautions reviewed FU with Dr. Clearance Coots as scheduled   Tawnya Crook 09/24/2012, 7:35 PM

## 2012-09-28 ENCOUNTER — Other Ambulatory Visit: Payer: Self-pay

## 2012-09-28 ENCOUNTER — Encounter (HOSPITAL_COMMUNITY): Payer: Self-pay

## 2012-09-28 ENCOUNTER — Ambulatory Visit (HOSPITAL_COMMUNITY)
Admission: RE | Admit: 2012-09-28 | Discharge: 2012-09-28 | Disposition: A | Discharge status: Home / Self Care | Payer: Medicaid Other | Source: Ambulatory Visit | Attending: Obstetrics & Gynecology | Admitting: Obstetrics & Gynecology

## 2012-09-28 DIAGNOSIS — O3510X0 Maternal care for (suspected) chromosomal abnormality in fetus, unspecified, not applicable or unspecified: Secondary | ICD-10-CM | POA: Insufficient documentation

## 2012-09-28 DIAGNOSIS — Z3689 Encounter for other specified antenatal screening: Secondary | ICD-10-CM | POA: Insufficient documentation

## 2012-09-28 DIAGNOSIS — O209 Hemorrhage in early pregnancy, unspecified: Secondary | ICD-10-CM | POA: Insufficient documentation

## 2012-09-28 DIAGNOSIS — O351XX0 Maternal care for (suspected) chromosomal abnormality in fetus, not applicable or unspecified: Secondary | ICD-10-CM | POA: Insufficient documentation

## 2012-09-28 DIAGNOSIS — Z3682 Encounter for antenatal screening for nuchal translucency: Secondary | ICD-10-CM

## 2012-09-28 NOTE — Progress Notes (Signed)
[facility] ultrasound  Indication: 32 yr old G2P0010 at [redacted]w[redacted]d for first trimester screen.  Findings: 1. Single intrauterine pregnancy. 2. Fetal crown rump length is consistent with dating. 3. Normal uterus; no adnexal masses seen. 4. Evaluation of fetal anatomy is limited by early gestational age. 5. Normal nuchal translucency measuring 1.51mm. 6. The nasal bone is indeterminate.  Recommendations: 1. First trimester screen done today. Discussed limitations in detecting fetal aneuploidy. 2. Recommend maternal serum AFP at 15-[redacted] weeks gestation. 3. Recommend fetal anatomic survey at 18-[redacted] weeks gestation. 4. Patient has had several episodes of vaginal bleeding: - recommend bleeding precautions  Eulis Foster, MD

## 2012-10-04 ENCOUNTER — Encounter: Payer: Self-pay | Admitting: Obstetrics & Gynecology

## 2012-10-05 ENCOUNTER — Encounter: Payer: Self-pay | Admitting: Obstetrics

## 2012-10-06 ENCOUNTER — Encounter: Payer: Self-pay | Admitting: Obstetrics & Gynecology

## 2012-10-07 ENCOUNTER — Encounter (HOSPITAL_COMMUNITY): Payer: Self-pay | Admitting: *Deleted

## 2012-10-07 ENCOUNTER — Inpatient Hospital Stay (HOSPITAL_COMMUNITY)
Admission: AD | Admit: 2012-10-07 | Discharge: 2012-10-07 | Disposition: A | Discharge status: Home / Self Care | Payer: Medicaid Other | Source: Ambulatory Visit | Attending: Obstetrics & Gynecology | Admitting: Obstetrics & Gynecology

## 2012-10-07 DIAGNOSIS — O4692 Antepartum hemorrhage, unspecified, second trimester: Secondary | ICD-10-CM

## 2012-10-07 DIAGNOSIS — O4690 Antepartum hemorrhage, unspecified, unspecified trimester: Secondary | ICD-10-CM

## 2012-10-07 DIAGNOSIS — R109 Unspecified abdominal pain: Secondary | ICD-10-CM | POA: Insufficient documentation

## 2012-10-07 DIAGNOSIS — O209 Hemorrhage in early pregnancy, unspecified: Secondary | ICD-10-CM | POA: Insufficient documentation

## 2012-10-07 LAB — URINALYSIS, ROUTINE W REFLEX MICROSCOPIC
Leukocytes, UA: NEGATIVE
Nitrite: NEGATIVE
Specific Gravity, Urine: 1.02 (ref 1.005–1.030)
pH: 6 (ref 5.0–8.0)

## 2012-10-07 LAB — URINE MICROSCOPIC-ADD ON

## 2012-10-07 MED ORDER — ONDANSETRON 4 MG PO TBDP: 4.0000 mg | ORAL_TABLET | Freq: Four times a day (QID) | ORAL | Status: DC | PRN

## 2012-10-07 NOTE — Progress Notes (Signed)
Spec exam and bimanual done.

## 2012-10-07 NOTE — MAU Provider Note (Signed)
History     CSN: [id number]  Arrival date and time: 10/07/12 0208   First Provider Initiated Contact with Patient 10/07/12 0243      Chief Complaint  Patient presents with  . Vaginal Bleeding  . Abdominal Cramping   HPI  Pt is a G2P0010 at 13.5 wks IUP here with report of vaginal bleeding that started at 0030.  Reports bleeding as more than a period.  No report of clots.  +nausea.    Past Medical History  Diagnosis Date  . Abnormal Pap smear   . GERD (gastroesophageal reflux disease)     Past Surgical History  Procedure Laterality Date  . Periurethral abscess drainage      abscess removed from tonsils. Tonsils not removed  . Leep    . Wisdom tooth extraction      Family History  Problem Relation Age of Onset  . Other Neg Hx   . Bladder Cancer    . Heart disease    . Cancer Mother   . Hypertension Mother   . Emphysema Father   . Diabetes Paternal Grandmother     History  Substance Use Topics  . Smoking status: Current Some Day Smoker -- 0.50 packs/day for 14 years    Types: Cigarettes  . Smokeless tobacco: Never Used  . Alcohol Use: No    Allergies: No Known Allergies  Prescriptions prior to admission  Medication Sig Dispense Refill  . acetaminophen (TYLENOL) 325 MG tablet Take 650 mg by mouth every 6 (six) hours as needed for pain (For headache.).      Marland Kitchen calcium carbonate (TUMS - DOSED IN MG ELEMENTAL CALCIUM) 500 MG chewable tablet Chew 1 tablet by mouth daily as needed for heartburn.      . Prenatal Vit-Fe Fumarate-FA (PRENATAL MULTIVITAMIN) TABS Take 1 tablet by mouth daily.  60 tablet  5  . buPROPion (ZYBAN) 150 MG 12 hr tablet Take 1 tablet (150 mg total) by mouth 2 (two) times daily.  60 tablet  1    Review of Systems  Gastrointestinal: Positive for nausea and vomiting.  Genitourinary: Positive for dysuria. Negative for hematuria.       Vaginal bleeding  Musculoskeletal: Positive for back pain (midback).  All other systems reviewed and are  negative.   Physical Exam   Blood pressure 136/85, temperature 98.7 F (37.1 C), resp. rate 18, height 5\' 2"  (1.575 m), weight 73.392 kg (161 lb 12.8 oz), last menstrual period 05/27/2012, SpO2 99.00%.  Physical Exam  Constitutional: She is oriented to person, place, and time. She appears well-developed and well-nourished. No distress.  HENT:  Head: Normocephalic.  Neck: Normal range of motion. Neck supple.  Cardiovascular: Normal rate, regular rhythm and normal heart sounds.   Respiratory: Effort normal and breath sounds normal. No respiratory distress.  GI: Soft. She exhibits no mass. There is no tenderness. There is no rebound and no guarding.  Genitourinary: There is bleeding (moderate; neg clots) around the vagina.  Neurological: She is alert and oriented to person, place, and time.  Skin: Skin is warm and dry.   FHR 155  MAU Course  Procedures  Results for orders placed during the hospital encounter of 10/07/12 (from the past 24 hour(s))  URINALYSIS, ROUTINE W REFLEX MICROSCOPIC     Status: Abnormal   Collection Time    10/07/12  2:15 AM      Result Value Range   Color, Urine YELLOW  YELLOW   APPearance CLEAR  CLEAR  Specific Gravity, Urine 1.020  1.005 - 1.030   pH 6.0  5.0 - 8.0   Glucose, UA NEGATIVE  NEGATIVE mg/dL   Hgb urine dipstick LARGE (*) NEGATIVE   Bilirubin Urine NEGATIVE  NEGATIVE   Ketones, ur NEGATIVE  NEGATIVE mg/dL   Protein, ur NEGATIVE  NEGATIVE mg/dL   Urobilinogen, UA 0.2  0.0 - 1.0 mg/dL   Nitrite NEGATIVE  NEGATIVE   Leukocytes, UA NEGATIVE  NEGATIVE  URINE MICROSCOPIC-ADD ON     Status: Abnormal   Collection Time    10/07/12  2:15 AM      Result Value Range   Squamous Epithelial / LPF FEW (*) RARE   WBC, UA 0-2  <3 WBC/hpf   RBC / HPF 7-10  <3 RBC/hpf   Bacteria, UA FEW (*) RARE     Assessment and Plan  Vaginal Bleeding in Pregnancy  Plan: Bleeding precautions Keep scheduled appointment  [facility] 10/07/2012, 2:45 AM

## 2012-10-07 NOTE — MAU Note (Signed)
I've had bleeding for few wks due to subchorionic hemorrhage. I've had n/v this pregnancy but worse last few days. Some back pain.

## 2012-10-07 NOTE — Progress Notes (Signed)
Threasa Heads CNM in to discuss lab results and d/c plan. Written and verbal d/c instructions given and understanding voiced.

## 2012-10-17 ENCOUNTER — Ambulatory Visit (INDEPENDENT_AMBULATORY_CARE_PROVIDER_SITE_OTHER): Payer: Medicaid Other | Admitting: Obstetrics & Gynecology

## 2012-10-17 ENCOUNTER — Encounter: Payer: Self-pay | Admitting: Obstetrics & Gynecology

## 2012-10-17 VITALS — BP 121/81 | Temp 98.4°F | Wt 160.6 lb

## 2012-10-17 DIAGNOSIS — Z3400 Encounter for supervision of normal first pregnancy, unspecified trimester: Secondary | ICD-10-CM

## 2012-10-17 DIAGNOSIS — Z3402 Encounter for supervision of normal first pregnancy, second trimester: Secondary | ICD-10-CM

## 2012-10-17 LAB — POCT URINALYSIS DIPSTICK
Blood, UA: NEGATIVE
Protein, UA: NEGATIVE
Spec Grav, UA: 1.015
Urobilinogen, UA: NEGATIVE

## 2012-10-17 NOTE — Progress Notes (Signed)
Pulse = 112 

## 2012-10-17 NOTE — Patient Instructions (Signed)
Pregnancy - Second Trimester The second trimester of pregnancy (3 to 6 months) is a period of rapid growth for you and your baby. At the end of the sixth month, your baby is about 9 inches long and weighs 1 1/2 pounds. You will begin to feel the baby move between 18 and 20 weeks of the pregnancy. This is called quickening. Weight gain is faster. A clear fluid (colostrum) may leak out of your breasts. You may feel small contractions of the womb (uterus). This is known as false labor or Braxton-Hicks contractions. This is like a practice for labor when the baby is ready to be born. Usually, the problems with morning sickness have usually passed by the end of your first trimester. Some women develop small dark blotches (called cholasma, mask of pregnancy) on their face that usually goes away after the baby is born. Exposure to the sun makes the blotches worse. Acne may also develop in some pregnant women and pregnant women who have acne, may find that it goes away. PRENATAL EXAMS  Blood work may continue to be done during prenatal exams. These tests are done to check on your health and the probable health of your baby. Blood work is used to follow your blood levels (hemoglobin). Anemia (low hemoglobin) is common during pregnancy. Iron and vitamins are given to help prevent this. You will also be checked for diabetes between 24 and 28 weeks of the pregnancy. Some of the previous blood tests may be repeated.  The size of the uterus is measured during each visit. This is to make sure that the baby is continuing to grow properly according to the dates of the pregnancy.  Your blood pressure is checked every prenatal visit. This is to make sure you are not getting toxemia.  Your urine is checked to make sure you do not have an infection, diabetes or protein in the urine.  Your weight is checked often to make sure gains are happening at the suggested rate. This is to ensure that both you and your baby are  growing normally.  Sometimes, an ultrasound is performed to confirm the proper growth and development of the baby. This is a test which bounces harmless sound waves off the baby so your caregiver can more accurately determine due dates. Sometimes, a test is done on the amniotic fluid surrounding the baby. This test is called an amniocentesis. The amniotic fluid is obtained by sticking a needle into the belly (abdomen). This is done to check the chromosomes in instances where there is a concern about possible genetic problems with the baby. It is also sometimes done near the end of pregnancy if an early delivery is required. In this case, it is done to help make sure the baby's lungs are mature enough for the baby to live outside of the womb. CHANGES OCCURING IN THE SECOND TRIMESTER OF PREGNANCY Your body goes through many changes during pregnancy. They vary from person to person. Talk to your caregiver about changes you notice that you are concerned about.  During the second trimester, you will likely have an increase in your appetite. It is normal to have cravings for certain foods. This varies from person to person and pregnancy to pregnancy.  Your lower abdomen will begin to bulge.  You may have to urinate more often because the uterus and baby are pressing on your bladder. It is also common to get more bladder infections during pregnancy. You can help this by drinking lots of fluids   and emptying your bladder before and after intercourse.  You may begin to get stretch marks on your hips, abdomen, and breasts. These are normal changes in the body during pregnancy. There are no exercises or medicines to take that prevent this change.  You may begin to develop swollen and bulging veins (varicose veins) in your legs. Wearing support hose, elevating your feet for 15 minutes, 3 to 4 times a day and limiting salt in your diet helps lessen the problem.  Heartburn may develop as the uterus grows and  pushes up against the stomach. Antacids recommended by your caregiver helps with this problem. Also, eating smaller meals 4 to 5 times a day helps.  Constipation can be treated with a stool softener or adding bulk to your diet. Drinking lots of fluids, and eating vegetables, fruits, and whole grains are helpful.  Exercising is also helpful. If you have been very active up until your pregnancy, most of these activities can be continued during your pregnancy. If you have been less active, it is helpful to start an exercise program such as walking.  Hemorrhoids may develop at the end of the second trimester. Warm sitz baths and hemorrhoid cream recommended by your caregiver helps hemorrhoid problems.  Backaches may develop during this time of your pregnancy. Avoid heavy lifting, wear low heal shoes, and practice good posture to help with backache problems.  Some pregnant women develop tingling and numbness of their hand and fingers because of swelling and tightening of ligaments in the wrist (carpel tunnel syndrome). This goes away after the baby is born.  As your breasts enlarge, you may have to get a bigger bra. Get a comfortable, cotton, support bra. Do not get a nursing bra until the last month of the pregnancy if you will be nursing the baby.  You may get a dark line from your belly button to the pubic area called the linea nigra.  You may develop rosy cheeks because of increase blood flow to the face.  You may develop spider looking lines of the face, neck, arms, and chest. These go away after the baby is born. HOME CARE INSTRUCTIONS   It is extremely important to avoid all smoking, herbs, alcohol, and unprescribed drugs during your pregnancy. These chemicals affect the formation and growth of the baby. Avoid these chemicals throughout the pregnancy to ensure the delivery of a healthy infant.  Most of your home care instructions are the same as suggested for the first trimester of your  pregnancy. Keep your caregiver's appointments. Follow your caregiver's instructions regarding medicine use, exercise, and diet.  During pregnancy, you are providing food for you and your baby. Continue to eat regular, well-balanced meals. Choose foods such as meat, fish, milk and other low fat dairy products, vegetables, fruits, and whole-grain breads and cereals. Your caregiver will tell you of the ideal weight gain.  A physical sexual relationship may be continued up until near the end of pregnancy if there are no other problems. Problems could include early (premature) leaking of amniotic fluid from the membranes, vaginal bleeding, abdominal pain, or other medical or pregnancy problems.  Exercise regularly if there are no restrictions. Check with your caregiver if you are unsure of the safety of some of your exercises. The greatest weight gain will occur in the last 2 trimesters of pregnancy. Exercise will help you:  Control your weight.  Get you in shape for labor and delivery.  Lose weight after you have the baby.  Wear   a good support or jogging bra for breast tenderness during pregnancy. This may help if worn during sleep. Pads or tissues may be used in the bra if you are leaking colostrum.  Do not use hot tubs, steam rooms or saunas throughout the pregnancy.  Wear your seat belt at all times when driving. This protects you and your baby if you are in an accident.  Avoid raw meat, uncooked cheese, cat litter boxes, and soil used by cats. These carry germs that can cause birth defects in the baby.  The second trimester is also a good time to visit your dentist for your dental health if this has not been done yet. Getting your teeth cleaned is okay. Use a soft toothbrush. Brush gently during pregnancy.  It is easier to leak urine during pregnancy. Tightening up and strengthening the pelvic muscles will help with this problem. Practice stopping your urination while you are going to the  bathroom. These are the same muscles you need to strengthen. It is also the muscles you would use as if you were trying to stop from passing gas. You can practice tightening these muscles up 10 times a set and repeating this about 3 times per day. Once you know what muscles to tighten up, do not perform these exercises during urination. It is more likely to contribute to an infection by backing up the urine.  Ask for help if you have financial, counseling, or nutritional needs during pregnancy. Your caregiver will be able to offer counseling for these needs as well as refer you for other special needs.  Your skin may become oily. If so, wash your face with mild soap, use non-greasy moisturizer and oil or cream based makeup. MEDICINES AND DRUG USE IN PREGNANCY  Take prenatal vitamins as directed. The vitamin should contain 1 milligram of folic acid. Keep all vitamins out of reach of children. Only a couple vitamins or tablets containing iron may be fatal to a baby or young child when ingested.  Avoid use of all medicines, including herbs, over-the-counter medicines, not prescribed or suggested by your caregiver. Only take over-the-counter or prescription medicines for pain, discomfort, or fever as directed by your caregiver. Do not use aspirin.  Let your caregiver also know about herbs you may be using.  Alcohol is related to a number of birth defects. This includes fetal alcohol syndrome. All alcohol, in any form, should be avoided completely. Smoking will cause low birth rate and premature babies.  Street or illegal drugs are very harmful to the baby. They are absolutely forbidden. A baby born to an addicted mother will be addicted at birth. The baby will go through the same withdrawal an adult does. SEEK MEDICAL CARE IF:  You have any concerns or worries during your pregnancy. It is better to call with your questions if you feel they cannot wait, rather than worry about them. SEEK IMMEDIATE  MEDICAL CARE IF:   An unexplained oral temperature above 102 F (38.9 C) develops, or as your caregiver suggests.  You have leaking of fluid from the vagina (birth canal). If leaking membranes are suspected, take your temperature and tell your caregiver of this when you call.  There is vaginal spotting, bleeding, or passing clots. Tell your caregiver of the amount and how many pads are used. Light spotting in pregnancy is common, especially following intercourse.  You develop a bad smelling vaginal discharge with a change in the color from clear to white.  You continue to feel   sick to your stomach (nauseated) and have no relief from remedies suggested. You vomit blood or coffee ground-like materials.  You lose more than 2 pounds of weight or gain more than 2 pounds of weight over 1 week, or as suggested by your caregiver.  You notice swelling of your face, hands, feet, or legs.  You get exposed to German measles and have never had them.  You are exposed to fifth disease or chickenpox.  You develop belly (abdominal) pain. Round ligament discomfort is a common non-cancerous (benign) cause of abdominal pain in pregnancy. Your caregiver still must evaluate you.  You develop a bad headache that does not go away.  You develop fever, diarrhea, pain with urination, or shortness of breath.  You develop visual problems, blurry, or double vision.  You fall or are in a car accident or any kind of trauma.  There is mental or physical violence at home. Document Released: 04/19/2001 Document Revised: 01/18/2012 Document Reviewed: 10/22/2008 [employer] Patient Information 2014 [employer].  

## 2012-10-18 NOTE — Progress Notes (Signed)
Doing well 

## 2012-10-19 LAB — ALPHA FETOPROTEIN, MATERNAL
MoM for AFP: 1.06
Osb Risk: 1:10700 {titer}

## 2012-10-24 ENCOUNTER — Encounter: Payer: Self-pay | Admitting: Obstetrics & Gynecology

## 2012-11-01 ENCOUNTER — Other Ambulatory Visit: Payer: Self-pay | Admitting: *Deleted

## 2012-11-01 DIAGNOSIS — Z3402 Encounter for supervision of normal first pregnancy, second trimester: Secondary | ICD-10-CM

## 2012-11-07 ENCOUNTER — Encounter: Payer: Self-pay | Admitting: Obstetrics & Gynecology

## 2012-11-07 ENCOUNTER — Ambulatory Visit (INDEPENDENT_AMBULATORY_CARE_PROVIDER_SITE_OTHER): Payer: Medicaid Other

## 2012-11-07 ENCOUNTER — Ambulatory Visit (INDEPENDENT_AMBULATORY_CARE_PROVIDER_SITE_OTHER): Payer: Medicaid Other | Admitting: Obstetrics & Gynecology

## 2012-11-07 VITALS — BP 130/83 | Temp 99.0°F | Wt 162.0 lb

## 2012-11-07 DIAGNOSIS — R51 Headache: Secondary | ICD-10-CM

## 2012-11-07 DIAGNOSIS — Z3481 Encounter for supervision of other normal pregnancy, first trimester: Secondary | ICD-10-CM

## 2012-11-07 DIAGNOSIS — Z3480 Encounter for supervision of other normal pregnancy, unspecified trimester: Secondary | ICD-10-CM

## 2012-11-07 DIAGNOSIS — Z3400 Encounter for supervision of normal first pregnancy, unspecified trimester: Secondary | ICD-10-CM

## 2012-11-07 DIAGNOSIS — R519 Headache, unspecified: Secondary | ICD-10-CM

## 2012-11-07 DIAGNOSIS — Z3402 Encounter for supervision of normal first pregnancy, second trimester: Secondary | ICD-10-CM

## 2012-11-07 LAB — POCT URINALYSIS DIPSTICK
Bilirubin, UA: NEGATIVE
Glucose, UA: NEGATIVE
Nitrite, UA: NEGATIVE
pH, UA: >=9

## 2012-11-07 MED ORDER — BUTALBITAL-APAP-CAFFEINE 50-325-40 MG PO TABS: 1.0000 | ORAL_TABLET | Freq: Four times a day (QID) | ORAL | Status: DC | PRN

## 2012-11-07 NOTE — Progress Notes (Signed)
P 114 Patient c/o pain LLQ usually when walking or stepping down. Patient does feel a lot lower pressure.  Patient states she has constant numbness on R thigh. Patient also has daily headaches.

## 2012-11-12 ENCOUNTER — Encounter: Payer: Self-pay | Admitting: Obstetrics

## 2012-11-12 LAB — US OB DETAIL + 14 WK

## 2012-11-28 ENCOUNTER — Encounter: Payer: Self-pay | Admitting: Obstetrics & Gynecology

## 2012-11-28 ENCOUNTER — Ambulatory Visit (INDEPENDENT_AMBULATORY_CARE_PROVIDER_SITE_OTHER): Payer: Medicaid Other | Admitting: Obstetrics & Gynecology

## 2012-11-28 VITALS — BP 135/89 | Temp 98.5°F | Wt 162.0 lb

## 2012-11-28 DIAGNOSIS — F17200 Nicotine dependence, unspecified, uncomplicated: Secondary | ICD-10-CM

## 2012-11-28 DIAGNOSIS — Z720 Tobacco use: Secondary | ICD-10-CM | POA: Insufficient documentation

## 2012-11-28 DIAGNOSIS — Z3482 Encounter for supervision of other normal pregnancy, second trimester: Secondary | ICD-10-CM

## 2012-11-28 DIAGNOSIS — Z113 Encounter for screening for infections with a predominantly sexual mode of transmission: Secondary | ICD-10-CM

## 2012-11-28 DIAGNOSIS — Z3480 Encounter for supervision of other normal pregnancy, unspecified trimester: Secondary | ICD-10-CM

## 2012-11-28 LAB — POCT URINALYSIS DIPSTICK
Bilirubin, UA: NEGATIVE
Glucose, UA: NEGATIVE
Ketones, UA: NEGATIVE
Leukocytes, UA: NEGATIVE
pH, UA: 8

## 2012-11-28 LAB — POCT FERNING: Ferning, POC: NEGATIVE

## 2012-11-28 MED ORDER — ZOLPIDEM TARTRATE 10 MG PO TABS
10.0000 mg | ORAL_TABLET | Freq: Every evening | ORAL | Status: DC | PRN
Start: 2012-11-28 — End: 2013-03-01

## 2012-11-28 NOTE — Patient Instructions (Signed)
Smoking Cessation Quitting smoking is important to your health and has many advantages. However, it is not always easy to quit since nicotine is a very addictive drug. Often times, people try 3 times or more before being able to quit. This document explains the best ways for you to prepare to quit smoking. Quitting takes hard work and a lot of effort, but you can do it. ADVANTAGES OF QUITTING SMOKING  You will live longer, feel better, and live better.  Your body will feel the impact of quitting smoking almost immediately.  Within 20 minutes, blood pressure decreases. Your pulse returns to its normal level.  After 8 hours, carbon monoxide levels in the blood return to normal. Your oxygen level increases.  After 24 hours, the chance of having a heart attack starts to decrease. Your breath, hair, and body stop smelling like smoke.  After 48 hours, damaged nerve endings begin to recover. Your sense of taste and smell improve.  After 72 hours, the body is virtually free of nicotine. Your bronchial tubes relax and breathing becomes easier.  After 2 to 12 weeks, lungs can hold more air. Exercise becomes easier and circulation improves.  The risk of having a heart attack, stroke, cancer, or lung disease is greatly reduced.  After 1 year, the risk of coronary heart disease is cut in half.  After 5 years, the risk of stroke falls to the same as a nonsmoker.  After 10 years, the risk of lung cancer is cut in half and the risk of other cancers decreases significantly.  After 15 years, the risk of coronary heart disease drops, usually to the level of a nonsmoker.  If you are pregnant, quitting smoking will improve your chances of having a healthy baby.  The people you live with, especially any children, will be healthier.  You will have extra money to spend on things other than cigarettes. QUESTIONS TO THINK ABOUT BEFORE ATTEMPTING TO QUIT You may want to talk about your answers with your  caregiver.  Why do you want to quit?  If you tried to quit in the past, what helped and what did not?  What will be the most difficult situations for you after you quit? How will you plan to handle them?  Who can help you through the tough times? Your family? Friends? A caregiver?  What pleasures do you get from smoking? What ways can you still get pleasure if you quit? Here are some questions to ask your caregiver:  How can you help me to be successful at quitting?  What medicine do you think would be best for me and how should I take it?  What should I do if I need more help?  What is smoking withdrawal like? How can I get information on withdrawal? GET READY  Set a quit date.  Change your environment by getting rid of all cigarettes, ashtrays, matches, and lighters in your home, car, or work. Do not let people smoke in your home.  Review your past attempts to quit. Think about what worked and what did not. GET SUPPORT AND ENCOURAGEMENT You have a better chance of being successful if you have help. You can get support in many ways.  Tell your family, friends, and co-workers that you are going to quit and need their support. Ask them not to smoke around you.  Get individual, group, or telephone counseling and support. Programs are available at local hospitals and health centers. Call your local health department for   information about programs in your area.  Spiritual beliefs and practices may help some smokers quit.  Download a "quit meter" on your computer to keep track of quit statistics, such as how long you have gone without smoking, cigarettes not smoked, and money saved.  Get a self-help book about quitting smoking and staying off of tobacco. LEARN NEW SKILLS AND BEHAVIORS  Distract yourself from urges to smoke. Talk to someone, go for a walk, or occupy your time with a task.  Change your normal routine. Take a different route to work. Drink tea instead of coffee.  Eat breakfast in a different place.  Reduce your stress. Take a hot bath, exercise, or read a book.  Plan something enjoyable to do every day. Reward yourself for not smoking.  Explore interactive web-based programs that specialize in helping you quit. GET MEDICINE AND USE IT CORRECTLY Medicines can help you stop smoking and decrease the urge to smoke. Combining medicine with the above behavioral methods and support can greatly increase your chances of successfully quitting smoking.  Nicotine replacement therapy helps deliver nicotine to your body without the negative effects and risks of smoking. Nicotine replacement therapy includes nicotine gum, lozenges, inhalers, nasal sprays, and skin patches. Some may be available over-the-counter and others require a prescription.  Antidepressant medicine helps people abstain from smoking, but how this works is unknown. This medicine is available by prescription.  Nicotinic receptor partial agonist medicine simulates the effect of nicotine in your brain. This medicine is available by prescription. Ask your caregiver for advice about which medicines to use and how to use them based on your health history. Your caregiver will tell you what side effects to look out for if you choose to be on a medicine or therapy. Carefully read the information on the package. Do not use any other product containing nicotine while using a nicotine replacement product.  RELAPSE OR DIFFICULT SITUATIONS Most relapses occur within the first 3 months after quitting. Do not be discouraged if you start smoking again. Remember, most people try several times before finally quitting. You may have symptoms of withdrawal because your body is used to nicotine. You may crave cigarettes, be irritable, feel very hungry, cough often, get headaches, or have difficulty concentrating. The withdrawal symptoms are only temporary. They are strongest when you first quit, but they will go away within  10 14 days. To reduce the chances of relapse, try to:  Avoid drinking alcohol. Drinking lowers your chances of successfully quitting.  Reduce the amount of caffeine you consume. Once you quit smoking, the amount of caffeine in your body increases and can give you symptoms, such as a rapid heartbeat, sweating, and anxiety.  Avoid smokers because they can make you want to smoke.  Do not let weight gain distract you. Many smokers will gain weight when they quit, usually less than 10 pounds. Eat a healthy diet and stay active. You can always lose the weight gained after you quit.  Find ways to improve your mood other than smoking. FOR MORE INFORMATION  www.smokefree.gov  Document Released: 04/19/2001 Document Revised: 10/25/2011 Document Reviewed: 08/04/2011 [employer] Patient Information 2014 [employer].  

## 2012-11-28 NOTE — Progress Notes (Signed)
Pulse-125 Pt c/o right leg, right arm, and left great toe numbing. Pt c/o feeling "really wet down there" only yesterday. Pt reports bladder and vaginal pressure.  Pt c/o bilateral leg cramping at night x 1 week. Pt is emotional due to mother passed away this morning from bladder cancer.

## 2012-11-28 NOTE — Addendum Note (Signed)
Addended by: Antionette Char on: 11/28/2012 10:29 PM   Modules accepted: Orders

## 2012-11-28 NOTE — Progress Notes (Signed)
SPEC: no pooling; white discharge fern/nitrazine negative.

## 2012-12-04 ENCOUNTER — Encounter: Payer: Self-pay | Admitting: Obstetrics & Gynecology

## 2012-12-12 ENCOUNTER — Encounter: Payer: Medicaid Other | Admitting: Obstetrics & Gynecology

## 2012-12-12 ENCOUNTER — Other Ambulatory Visit: Payer: Medicaid Other

## 2012-12-13 ENCOUNTER — Ambulatory Visit (INDEPENDENT_AMBULATORY_CARE_PROVIDER_SITE_OTHER): Payer: Medicaid Other | Admitting: Obstetrics & Gynecology

## 2012-12-13 ENCOUNTER — Other Ambulatory Visit: Payer: Medicaid Other

## 2012-12-13 VITALS — BP 135/81 | Temp 98.6°F | Wt 162.0 lb

## 2012-12-13 DIAGNOSIS — Z3400 Encounter for supervision of normal first pregnancy, unspecified trimester: Secondary | ICD-10-CM

## 2012-12-13 DIAGNOSIS — Z3402 Encounter for supervision of normal first pregnancy, second trimester: Secondary | ICD-10-CM

## 2012-12-13 LAB — POCT URINALYSIS DIPSTICK
Blood, UA: NEGATIVE
Nitrite, UA: NEGATIVE
Urobilinogen, UA: NEGATIVE
pH, UA: 8

## 2012-12-13 LAB — CBC
HCT: 38.1 % (ref 36.0–46.0)
Hemoglobin: 12.5 g/dL (ref 12.0–15.0)
MCH: 30.6 pg (ref 26.0–34.0)
MCHC: 32.8 g/dL (ref 30.0–36.0)

## 2012-12-13 NOTE — Progress Notes (Signed)
Pulse-105 

## 2012-12-14 ENCOUNTER — Encounter: Payer: Self-pay | Admitting: Obstetrics & Gynecology

## 2012-12-14 LAB — RPR

## 2012-12-14 NOTE — Patient Instructions (Signed)
Pregnancy - Second Trimester The second trimester is the period between 13 to 27 weeks of your pregnancy. It is important to follow your doctor's instructions. HOME CARE   Do not smoke.  Do not drink alcohol or use drugs.  Only take medicine as told by your doctor.  Take prenatal vitamins as told. The vitamin should contain 1 milligram of folic acid.  Exercise.  Eat healthy foods. Eat regular, well-balanced meals.  You can have sex (intercourse) if there are no other problems with the pregnancy.  Do not use hot tubs, steam rooms, or saunas.  Wear a seat belt while driving.  Avoid raw meat, uncooked cheese, and litter boxes and soil used by cats.  Visit your dentist. Cleanings are okay. GET HELP RIGHT AWAY IF:   You have a temperature by mouth above 102 F (38.9 C), not controlled by medicine.  Fluid is coming from your vagina.  Blood is coming from your vagina. Light spotting is common, especially after sex (intercourse).  You have a bad smelling fluid (discharge) coming from the vagina. The fluid changes from clear to white.  You still feel sick to your stomach (nauseous).  You throw up (vomit) blood.  You lose or gain more than 2 pounds (0.9 kilograms) of weight in a week, or as suggested by your doctor.  Your face, hands, feet, or legs get puffy (swell).  You get exposed to German measles and have never had them.  You get exposed to fifth disease or chickenpox.  You have belly (abdominal) pain.  You have a bad headache that will not go away.  You have watery poop (diarrhea), pain when you pee (urinate), or have shortness of breath.  You start to have problems seeing (blurry or double vision).  You fall, are in a car accident, or have any kind of trauma.  There is mental or physical violence at home.  You have any concerns or worries during your pregnancy. MAKE SURE YOU:   Understand these instructions.  Will watch your condition.  Will get help  right away if you are not doing well or get worse. Document Released: 07/20/2009 Document Revised: 07/18/2011 Document Reviewed: 07/20/2009 [employer] Patient Information 2014 [employer].  

## 2012-12-14 NOTE — Progress Notes (Signed)
Doing well 

## 2012-12-18 ENCOUNTER — Encounter: Payer: Self-pay | Admitting: Obstetrics

## 2012-12-21 ENCOUNTER — Encounter: Payer: Self-pay | Admitting: Obstetrics & Gynecology

## 2013-01-09 ENCOUNTER — Other Ambulatory Visit: Payer: Medicaid Other

## 2013-01-09 ENCOUNTER — Encounter: Payer: Medicaid Other | Admitting: Obstetrics & Gynecology

## 2013-01-10 ENCOUNTER — Ambulatory Visit (INDEPENDENT_AMBULATORY_CARE_PROVIDER_SITE_OTHER): Payer: Medicaid Other | Admitting: Obstetrics & Gynecology

## 2013-01-10 VITALS — BP 120/78 | Temp 99.0°F | Wt 164.0 lb

## 2013-01-10 DIAGNOSIS — Z3402 Encounter for supervision of normal first pregnancy, second trimester: Secondary | ICD-10-CM

## 2013-01-10 DIAGNOSIS — Z3400 Encounter for supervision of normal first pregnancy, unspecified trimester: Secondary | ICD-10-CM

## 2013-01-10 LAB — POCT URINALYSIS DIPSTICK
Bilirubin, UA: NEGATIVE
Blood, UA: NEGATIVE
Nitrite, UA: NEGATIVE

## 2013-01-10 NOTE — Progress Notes (Signed)
P 106 Patient reports some lower pressure and tightness. Patient is having trouble sleeping. Patient reports R flank pain for 1 week.

## 2013-01-11 ENCOUNTER — Encounter: Payer: Self-pay | Admitting: Obstetrics & Gynecology

## 2013-01-14 ENCOUNTER — Encounter: Payer: Self-pay | Admitting: Obstetrics & Gynecology

## 2013-01-22 ENCOUNTER — Encounter: Payer: Self-pay | Admitting: Obstetrics

## 2013-01-22 ENCOUNTER — Encounter: Payer: Self-pay | Admitting: Obstetrics & Gynecology

## 2013-01-24 ENCOUNTER — Encounter: Payer: Self-pay | Admitting: Obstetrics & Gynecology

## 2013-01-24 ENCOUNTER — Ambulatory Visit (INDEPENDENT_AMBULATORY_CARE_PROVIDER_SITE_OTHER): Payer: Medicaid Other | Admitting: Obstetrics & Gynecology

## 2013-01-24 ENCOUNTER — Other Ambulatory Visit: Payer: Self-pay | Admitting: Obstetrics & Gynecology

## 2013-01-24 VITALS — BP 124/81 | Temp 98.2°F | Wt 164.0 lb

## 2013-01-24 DIAGNOSIS — Z3400 Encounter for supervision of normal first pregnancy, unspecified trimester: Secondary | ICD-10-CM

## 2013-01-24 DIAGNOSIS — Z3402 Encounter for supervision of normal first pregnancy, second trimester: Secondary | ICD-10-CM

## 2013-01-24 DIAGNOSIS — R1013 Epigastric pain: Secondary | ICD-10-CM

## 2013-01-24 LAB — POCT URINALYSIS DIPSTICK
Leukocytes, UA: NEGATIVE
Nitrite, UA: NEGATIVE
Protein, UA: NEGATIVE
Urobilinogen, UA: NEGATIVE
pH, UA: 7

## 2013-01-24 LAB — CBC
MCHC: 34.3 g/dL (ref 30.0–36.0)
Platelets: 201 10*3/uL (ref 150–400)
RDW: 13.8 % (ref 11.5–15.5)
WBC: 13.2 10*3/uL — ABNORMAL HIGH (ref 4.0–10.5)

## 2013-01-24 LAB — COMPREHENSIVE METABOLIC PANEL
ALT: 10 U/L (ref 0–35)
AST: 12 U/L (ref 0–37)
Alkaline Phosphatase: 94 U/L (ref 39–117)
Glucose, Bld: 114 mg/dL — ABNORMAL HIGH (ref 70–99)
Sodium: 138 mEq/L (ref 135–145)
Total Bilirubin: 0.3 mg/dL (ref 0.3–1.2)
Total Protein: 6 g/dL (ref 6.0–8.3)

## 2013-01-24 NOTE — Progress Notes (Signed)
Pulse- 110; c/o dyspepsia, URI symptoms.  Counseled re: antacids.  Testing for H pylori.

## 2013-01-25 ENCOUNTER — Encounter: Payer: Self-pay | Admitting: Obstetrics & Gynecology

## 2013-01-25 LAB — H. PYLORI ANTIBODY, IGG: H Pylori IgG: 0.5 {ISR}

## 2013-01-25 NOTE — Patient Instructions (Signed)
Indigestion  Indigestion is discomfort in the upper abdomen that is caused by underlying problems such as gastroesophageal reflux disease (GERD), ulcers, or gallbladder problems.   CAUSES   Indigestion can be caused by many things. Possible causes include:  · Stomach acid in the esophagus.  · Stomach infections, usually caused by the bacteria H. pylori.  · Being overweight.  · Hiatal hernia. This means part of the stomach pushes up through the diaphragm.  · Overeating.  · Emotional problems, such as stress, anxiety, or depression.  · Poor nutrition.  · Consuming too much alcohol, tobacco, or caffeine.  · Consuming spicy foods, fats, peppermint, chocolate, tomato products, citrus, or fruit juices.  · Medicines such as aspirin and other anti-inflammatory drugs, hormones, steroids, and thyroid medicines.  · Gastroparesis. This is a condition in which the stomach does not empty properly.  · Stomach cancer.  · Pregnancy, due to an increase in hormone levels, a relaxation of muscles in the digestive tract, and pressure on the stomach from the growing fetus.  SYMPTOMS   · Uncomfortable feeling of fullness after eating.  · Pain or burning sensation in the upper abdomen.  · Bloating.  · Belching and gas.  · Nausea and vomiting.  · Acidic taste in the mouth.  · Burning sensation in the chest (heartburn).  DIAGNOSIS   Your caregiver will review your medical history and perform a physical exam. Other tests, such as blood tests, stool tests, X-rays, and other imaging scans, may be done to check for more serious problems.  TREATMENT   Liquid antacids and other drugs may be given to block stomach acid secretion. Medicines that increase esophageal muscle tone may also be given to help reduce symptoms. If an infection is found, antibiotic medicine may be given.  HOME CARE INSTRUCTIONS  · Avoid foods and drinks that make your symptoms worse, such as:  · Caffeine or alcoholic drinks.  · Chocolate.  · Peppermint or mint  flavorings.  · Garlic and onions.  · Spicy foods.  · Citrus fruits, such as oranges, lemons, or limes.  · Tomato-based foods such as sauce, chili, salsa, and pizza.  · Fried and fatty foods.  · Avoid eating for the 3 hours prior to your bedtime.  · Eat small, frequent meals instead of large meals.  · Stop smoking if you smoke.  · Maintain a healthy weight.  · Wear loose-fitting clothing. Do not wear anything tight around your waist that causes pressure on your stomach.  · Raise the head of your bed 4 to 8 inches with wood blocks to help you sleep. Extra pillows will not help.  · Only take over-the-counter or prescription medicines as directed by your caregiver.  · Do not take aspirin, ibuprofen, or other nonsteroidal anti-inflammatory drugs (NSAIDs).  SEEK IMMEDIATE MEDICAL CARE IF:   · You are not better after 2 days.  · You have chest pressure or pain that radiates up into your neck, arms, back, jaw, or upper abdomen.  · You have difficulty swallowing.  · You keep vomiting.  · You have black or bloody stools.  · You have a fever.  · You have dizziness, fainting, difficulty breathing, or heavy sweating.  · You have severe abdominal pain.  · You lose weight without trying.  MAKE SURE YOU:  · Understand these instructions.  · Will watch your condition.  · Will get help right away if you are not doing well or get worse.  Document Released: 06/02/2004   Document Revised: 07/18/2011 Document Reviewed: 12/08/2010  [employer]® Patient Information ©2014 [employer].

## 2013-01-27 ENCOUNTER — Other Ambulatory Visit: Payer: Self-pay | Admitting: *Deleted

## 2013-01-27 DIAGNOSIS — R1013 Epigastric pain: Secondary | ICD-10-CM

## 2013-01-28 ENCOUNTER — Other Ambulatory Visit: Payer: Self-pay | Admitting: Family

## 2013-01-29 ENCOUNTER — Ambulatory Visit (HOSPITAL_COMMUNITY)
Admission: RE | Admit: 2013-01-29 | Discharge: 2013-01-29 | Disposition: A | Discharge status: Home / Self Care | Payer: Medicaid Other | Source: Ambulatory Visit | Attending: Obstetrics & Gynecology | Admitting: Obstetrics & Gynecology

## 2013-01-29 DIAGNOSIS — R112 Nausea with vomiting, unspecified: Secondary | ICD-10-CM | POA: Insufficient documentation

## 2013-01-29 DIAGNOSIS — R1013 Epigastric pain: Secondary | ICD-10-CM | POA: Insufficient documentation

## 2013-02-01 ENCOUNTER — Encounter: Payer: Self-pay | Admitting: Obstetrics & Gynecology

## 2013-02-07 ENCOUNTER — Inpatient Hospital Stay (HOSPITAL_COMMUNITY)
Admission: AD | Admit: 2013-02-07 | Discharge: 2013-02-07 | Disposition: A | Discharge status: Home / Self Care | Payer: Medicaid Other | Source: Ambulatory Visit | Attending: Obstetrics | Admitting: Obstetrics

## 2013-02-07 ENCOUNTER — Encounter (HOSPITAL_COMMUNITY): Payer: Self-pay | Admitting: *Deleted

## 2013-02-07 ENCOUNTER — Encounter: Payer: Self-pay | Admitting: Obstetrics & Gynecology

## 2013-02-07 ENCOUNTER — Inpatient Hospital Stay (HOSPITAL_COMMUNITY): Payer: Medicaid Other

## 2013-02-07 ENCOUNTER — Ambulatory Visit (INDEPENDENT_AMBULATORY_CARE_PROVIDER_SITE_OTHER): Payer: Medicaid Other | Admitting: Obstetrics & Gynecology

## 2013-02-07 VITALS — BP 120/78 | Temp 98.6°F | Wt 162.4 lb

## 2013-02-07 DIAGNOSIS — Z3403 Encounter for supervision of normal first pregnancy, third trimester: Secondary | ICD-10-CM

## 2013-02-07 DIAGNOSIS — Z3402 Encounter for supervision of normal first pregnancy, second trimester: Secondary | ICD-10-CM

## 2013-02-07 DIAGNOSIS — O4700 False labor before 37 completed weeks of gestation, unspecified trimester: Secondary | ICD-10-CM | POA: Insufficient documentation

## 2013-02-07 DIAGNOSIS — O365931 Maternal care for other known or suspected poor fetal growth, third trimester, fetus 1: Secondary | ICD-10-CM

## 2013-02-07 DIAGNOSIS — O479 False labor, unspecified: Secondary | ICD-10-CM

## 2013-02-07 DIAGNOSIS — Z720 Tobacco use: Secondary | ICD-10-CM

## 2013-02-07 DIAGNOSIS — F17200 Nicotine dependence, unspecified, uncomplicated: Secondary | ICD-10-CM

## 2013-02-07 DIAGNOSIS — Z3400 Encounter for supervision of normal first pregnancy, unspecified trimester: Secondary | ICD-10-CM

## 2013-02-07 LAB — POCT URINALYSIS DIPSTICK
Glucose, UA: NEGATIVE
Ketones, UA: NEGATIVE
Leukocytes, UA: NEGATIVE
Spec Grav, UA: 1.015
Urobilinogen, UA: NEGATIVE

## 2013-02-07 MED ORDER — BETAMETHASONE SOD PHOS & ACET 6 (3-3) MG/ML IJ SUSP
12.0000 mg | Freq: Once | INTRAMUSCULAR | Status: AC
  Administered 2013-02-07: 12 mg via INTRAMUSCULAR
  Filled 2013-02-07: qty 2

## 2013-02-07 MED ORDER — BETAMETHASONE 0.6 MG/5ML PO SOLN: 1.2000 mg | Freq: Two times a day (BID) | ORAL | Status: DC

## 2013-02-07 MED ORDER — NIFEDIPINE 10 MG PO CAPS
20.0000 mg | ORAL_CAPSULE | Freq: Three times a day (TID) | ORAL | Status: DC
  Administered 2013-02-07: 20 mg via ORAL
  Filled 2013-02-07: qty 2

## 2013-02-07 NOTE — MAU Note (Signed)
Patient sent from the office to monitor for contractions. Had a short cervix in the office.

## 2013-02-07 NOTE — Progress Notes (Signed)
Pulse - 110.  Abnormal discharge.  Pt states that she lost her mucous plug.  Informal U/S: cervical length 1.4 cm.  To [facility]-->Procardia/steroids.

## 2013-02-07 NOTE — MAU Provider Note (Signed)
History     CSN: [id number]  Arrival date and time: 02/07/13 1629   None     Chief Complaint  Patient presents with  . Labor Eval   HPI  Pt is G2P0010 [redacted]w[redacted]d pregnant seen in the office today with dilated and cervix 1 cm and contractions. Pt thinks she lost her mucous plug 2 days ago.  Pt was feeling pressure but didn't think she was having ctx. Orders were sent for pt to have BMZ, procardia and ultrasound  Past Medical History  Diagnosis Date  . Abnormal Pap smear   . GERD (gastroesophageal reflux disease)     Past Surgical History  Procedure Laterality Date  . Periurethral abscess drainage      abscess removed from tonsils. Tonsils not removed  . Leep    . Wisdom tooth extraction      Family History  Problem Relation Age of Onset  . Other Neg Hx   . Bladder Cancer    . Heart disease    . Cancer Mother   . Hypertension Mother   . Emphysema Father   . Diabetes Paternal Grandmother     History  Substance Use Topics  . Smoking status: Current Some Day Smoker -- 0.50 packs/day for 14 years    Types: Cigarettes  . Smokeless tobacco: Never Used  . Alcohol Use: No    Allergies: No Known Allergies  Prescriptions prior to admission  Medication Sig Dispense Refill  . butalbital-acetaminophen-caffeine (FIORICET) 50-325-40 MG per tablet Take 1-2 tablets by mouth every 6 (six) hours as needed for headache.  40 tablet  2  . calcium carbonate (TUMS - DOSED IN MG ELEMENTAL CALCIUM) 500 MG chewable tablet Chew 1 tablet by mouth daily as needed for heartburn.      . ondansetron (ZOFRAN ODT) 4 MG disintegrating tablet Take 1 tablet (4 mg total) by mouth every 6 (six) hours as needed for nausea.  20 tablet  0  . Prenatal Vit-Fe Fumarate-FA (PRENATAL MULTIVITAMIN) TABS Take 1 tablet by mouth daily.  60 tablet  5  . zolpidem (AMBIEN CR) 6.25 MG CR tablet Take 6.25 mg by mouth at bedtime as needed for sleep.      Marland Kitchen zolpidem (AMBIEN) 10 MG tablet Take 1 tablet (10 mg total) by  mouth at bedtime as needed for sleep.  14 tablet  0    Review of Systems  Constitutional: Negative for fever and chills.  Gastrointestinal: Negative for heartburn, nausea, vomiting, abdominal pain and diarrhea.  Genitourinary: Negative for dysuria and urgency.   Physical Exam   Blood pressure 126/83, pulse 107, resp. rate 18, height 5\' 2"  (1.575 m), weight 73.936 kg (163 lb), last menstrual period 05/27/2012.  Physical Exam  Nursing note and vitals reviewed. Constitutional: She is oriented to person, place, and time. She appears well-developed and well-nourished. No distress.  HENT:  Head: Normocephalic.  Eyes: Pupils are equal, round, and reactive to light.  Neck: Normal range of motion.  Cardiovascular: Normal rate.   Respiratory: Effort normal.  GI: Soft. She exhibits no distension. There is no tenderness. There is no rebound.  Genitourinary:  deferred  Musculoskeletal: Normal range of motion.  Neurological: She is alert and oriented to person, place, and time.  Skin: Skin is warm and dry.  Psychiatric: She has a normal mood and affect.    MAU Course  Procedures Results for orders placed in visit on 02/07/13 (from the past 48 hour(s))  POCT URINALYSIS DIPSTICK  Status: None   Collection Time    02/07/13  2:28 PM      Result Value Range   Color, UA YELLOW     Clarity, UA CLEAR     Glucose, UA NEGATIVE     Bilirubin, UA NEGATIVE     Ketones, UA NEGATIVE     Spec Grav, UA 1.015     Blood, UA NEGATIVE     pH, UA 6.0     Protein, UA TRACE     Urobilinogen, UA negative     Nitrite, UA NEGATIVE     Leukocytes, UA Negative       Procardia given and BMZ Pt not having any contractions; FHR 140 bpm  With 6-25bpm variability and 10x10 accelertions Reported to Dr. Tamela Oddi Assessment and Plan  Threatened preterm labor BMZ given in MAU- pt to return in 24 hours for 2nd dose Pelvic rest  And restricted activity Tami Foster 02/07/2013, 5:18 PM

## 2013-02-07 NOTE — Patient Instructions (Signed)

## 2013-02-08 ENCOUNTER — Encounter (HOSPITAL_COMMUNITY): Payer: Self-pay | Admitting: *Deleted

## 2013-02-08 ENCOUNTER — Encounter: Payer: Self-pay | Admitting: Obstetrics & Gynecology

## 2013-02-08 ENCOUNTER — Inpatient Hospital Stay (HOSPITAL_COMMUNITY)
Admission: AD | Admit: 2013-02-08 | Discharge: 2013-02-08 | Disposition: A | Discharge status: Home / Self Care | Payer: Medicaid Other | Source: Ambulatory Visit | Attending: Obstetrics | Admitting: Obstetrics

## 2013-02-08 DIAGNOSIS — O4702 False labor before 37 completed weeks of gestation, second trimester: Secondary | ICD-10-CM

## 2013-02-08 DIAGNOSIS — M549 Dorsalgia, unspecified: Secondary | ICD-10-CM | POA: Insufficient documentation

## 2013-02-08 DIAGNOSIS — O4700 False labor before 37 completed weeks of gestation, unspecified trimester: Secondary | ICD-10-CM | POA: Insufficient documentation

## 2013-02-08 DIAGNOSIS — O479 False labor, unspecified: Secondary | ICD-10-CM

## 2013-02-08 DIAGNOSIS — Z0374 Encounter for suspected problem with fetal growth ruled out: Secondary | ICD-10-CM | POA: Insufficient documentation

## 2013-02-08 MED ORDER — BETAMETHASONE SOD PHOS & ACET 6 (3-3) MG/ML IJ SUSP
12.0000 mg | Freq: Once | INTRAMUSCULAR | Status: AC
  Administered 2013-02-08: 12 mg via INTRAMUSCULAR
  Filled 2013-02-08: qty 2

## 2013-02-08 MED ORDER — CYCLOBENZAPRINE HCL 10 MG PO TABS: 10.0000 mg | ORAL_TABLET | Freq: Two times a day (BID) | ORAL | Status: DC | PRN

## 2013-02-08 MED ORDER — CYCLOBENZAPRINE HCL 10 MG PO TABS
10.0000 mg | ORAL_TABLET | Freq: Once | ORAL | Status: AC
  Administered 2013-02-08: 10 mg via ORAL
  Filled 2013-02-08: qty 1

## 2013-02-08 NOTE — MAU Provider Note (Signed)
History     CSN: [id number]  Arrival date and time: 02/08/13 1730   First Provider Initiated Contact with Patient 02/08/13 1830      Chief Complaint  Patient presents with  . Labor Eval   HPI [name] is a 32 y.o. G2P0010 at [redacted]w[redacted]d who presents to MAU today for her second betamethasone injection. The patient was seen yesterday with preterm contractions. Today she states that she has continued to have contractions once per hour. She has had an increase in back pain, but admits that she has not been on bedrest as recommended yesterday. She denies vaginal bleeding, but has continued to have some thin, white discharge that has not changed. She denies LOF. She reports good fetal movement.   OB History   Grav Para Term Preterm Abortions TAB SAB Ect Mult Living   2    1  1    0      Past Medical History  Diagnosis Date  . Abnormal Pap smear   . GERD (gastroesophageal reflux disease)     Past Surgical History  Procedure Laterality Date  . Periurethral abscess drainage      abscess removed from tonsils. Tonsils not removed  . Leep    . Wisdom tooth extraction      Family History  Problem Relation Age of Onset  . Other Neg Hx   . Bladder Cancer    . Heart disease    . Cancer Mother   . Hypertension Mother   . Emphysema Father   . Diabetes Paternal Grandmother     History  Substance Use Topics  . Smoking status: Current Some Day Smoker -- 0.50 packs/day for 14 years    Types: Cigarettes  . Smokeless tobacco: Never Used  . Alcohol Use: No    Allergies: No Known Allergies  Prescriptions prior to admission  Medication Sig Dispense Refill  . acetaminophen (TYLENOL) 325 MG tablet Take 650 mg by mouth every 6 (six) hours as needed for pain (For headache.).      Marland Kitchen butalbital-acetaminophen-caffeine (FIORICET) 50-325-40 MG per tablet Take 1-2 tablets by mouth every 6 (six) hours as needed for headache.  40 tablet  2  . calcium carbonate (TUMS - DOSED IN MG  ELEMENTAL CALCIUM) 500 MG chewable tablet Chew 2 tablets by mouth 3 (three) times daily as needed for heartburn.       . ondansetron (ZOFRAN-ODT) 4 MG disintegrating tablet Take 4 mg by mouth every 6 (six) hours as needed for nausea.      . Prenatal Vit-Fe Fumarate-FA (PRENATAL MULTIVITAMIN) TABS Take 1 tablet by mouth daily.  60 tablet  5  . zolpidem (AMBIEN CR) 6.25 MG CR tablet Take 6.25 mg by mouth at bedtime as needed for sleep.      Marland Kitchen zolpidem (AMBIEN) 10 MG tablet Take 1 tablet (10 mg total) by mouth at bedtime as needed for sleep.  14 tablet  0  . [DISCONTINUED] ondansetron (ZOFRAN ODT) 4 MG disintegrating tablet Take 1 tablet (4 mg total) by mouth every 6 (six) hours as needed for nausea.  20 tablet  0    Review of Systems  Gastrointestinal: Positive for nausea, vomiting and abdominal pain.   Physical Exam   Blood pressure 118/78, pulse 108, temperature 98 F (36.7 C), temperature source Oral, resp. rate 18, last menstrual period 05/27/2012.  Physical Exam  Constitutional: She is oriented to person, place, and time. She appears well-developed and well-nourished. No distress.  HENT:  Head: Normocephalic and atraumatic.  Cardiovascular: Tachycardia present.   Respiratory: Effort normal.  Neurological: She is alert and oriented to person, place, and time.  Skin: Skin is warm and dry. No erythema.  Psychiatric: She has a normal mood and affect.  Dilation: 1 Effacement (%): 50 Station: -3 Presentation: Vertex Exam by:: Morrison Old RN  Fetal Monitoring: Baseline: 140 bpm, moderate variability, + 10x10 accelerations, no decelerations Contractions: None  MAU Course  Procedures None  MDM Cervix is unchanged since yesterday Reactive NST without contractions Patient complains of "sluggish" bowels - recommended Colace over-the-counter 10 mg Flexeril given in MAU for back pain Assessment and Plan  A: Preterm contractions Back pain in pregnancy  P: Discharge home 2nd  Betamethasone IM given in MAU Labor precautions and kick counts discussed Rx for Flexeril sent to patient's pharmacy Patient advised to reduce strenuous activity and increase rest time for back pain Patient advised to keep appointment with Dr. Clearance Coots as scheduled for routine OB follow-up Patient may return to MAU as needed or if her condition were to change or worsen  Freddi Starr, PA-C  02/08/2013, 7:13 PM

## 2013-02-08 NOTE — MAU Note (Signed)
C/o ucs and back pain since this AM around 0400; Also c/o headache since 16109;

## 2013-02-09 LAB — CULTURE, OB URINE: Colony Count: 40000

## 2013-02-14 ENCOUNTER — Ambulatory Visit (INDEPENDENT_AMBULATORY_CARE_PROVIDER_SITE_OTHER): Payer: Medicaid Other | Admitting: Obstetrics & Gynecology

## 2013-02-14 VITALS — BP 126/86 | Temp 98.2°F | Wt 165.4 lb

## 2013-02-14 DIAGNOSIS — Z3480 Encounter for supervision of other normal pregnancy, unspecified trimester: Secondary | ICD-10-CM

## 2013-02-14 DIAGNOSIS — Z3483 Encounter for supervision of other normal pregnancy, third trimester: Secondary | ICD-10-CM

## 2013-02-14 LAB — POCT URINALYSIS DIPSTICK
Bilirubin, UA: NEGATIVE
Ketones, UA: NEGATIVE
Leukocytes, UA: NEGATIVE
Protein, UA: NEGATIVE
Spec Grav, UA: 1.015

## 2013-02-14 NOTE — Progress Notes (Signed)
Pulse- 112.  Average one contraction per hour.  Back pain.

## 2013-02-15 ENCOUNTER — Encounter: Payer: Self-pay | Admitting: Obstetrics & Gynecology

## 2013-02-21 ENCOUNTER — Encounter: Payer: Self-pay | Admitting: Obstetrics & Gynecology

## 2013-02-21 ENCOUNTER — Ambulatory Visit (INDEPENDENT_AMBULATORY_CARE_PROVIDER_SITE_OTHER): Payer: Medicaid Other | Admitting: Obstetrics & Gynecology

## 2013-02-21 VITALS — BP 129/86 | Temp 99.0°F | Wt 164.0 lb

## 2013-02-21 DIAGNOSIS — Z3400 Encounter for supervision of normal first pregnancy, unspecified trimester: Secondary | ICD-10-CM

## 2013-02-21 DIAGNOSIS — Z3403 Encounter for supervision of normal first pregnancy, third trimester: Secondary | ICD-10-CM

## 2013-02-21 LAB — POCT URINALYSIS DIPSTICK
Bilirubin, UA: NEGATIVE
Glucose, UA: NEGATIVE
Ketones, UA: NEGATIVE
Nitrite, UA: NEGATIVE
Spec Grav, UA: 1.02

## 2013-02-21 MED ORDER — TRAMADOL HCL 50 MG PO TABS: 50.0000 mg | ORAL_TABLET | Freq: Four times a day (QID) | ORAL | Status: DC | PRN

## 2013-02-21 NOTE — Progress Notes (Signed)
P 108 Patient reports she is having back pain. Patient also states she has increased "wetness". Yesterday the pain was pretty bad- patient had n/v episode and her pain got worse.  SPEC: no pooling; fern and nitrazine.

## 2013-02-26 ENCOUNTER — Encounter: Payer: Self-pay | Admitting: Obstetrics & Gynecology

## 2013-02-26 ENCOUNTER — Other Ambulatory Visit: Payer: Self-pay | Admitting: Obstetrics & Gynecology

## 2013-02-26 ENCOUNTER — Ambulatory Visit (INDEPENDENT_AMBULATORY_CARE_PROVIDER_SITE_OTHER): Payer: Medicaid Other

## 2013-02-26 DIAGNOSIS — O0990 Supervision of high risk pregnancy, unspecified, unspecified trimester: Secondary | ICD-10-CM

## 2013-02-26 DIAGNOSIS — O365931 Maternal care for other known or suspected poor fetal growth, third trimester, fetus 1: Secondary | ICD-10-CM

## 2013-02-26 LAB — US OB DETAIL + 14 WK

## 2013-02-27 ENCOUNTER — Inpatient Hospital Stay (HOSPITAL_COMMUNITY)
Admission: AD | Admit: 2013-02-27 | Discharge: 2013-03-01 | DRG: 775 | Disposition: A | Discharge status: Home / Self Care | Payer: Medicaid Other | Source: Ambulatory Visit | Attending: Obstetrics & Gynecology | Admitting: Obstetrics & Gynecology

## 2013-02-27 ENCOUNTER — Encounter (HOSPITAL_COMMUNITY): Payer: Self-pay | Admitting: *Deleted

## 2013-02-27 DIAGNOSIS — Z3403 Encounter for supervision of normal first pregnancy, third trimester: Secondary | ICD-10-CM

## 2013-02-27 DIAGNOSIS — O328XX0 Maternal care for other malpresentation of fetus, not applicable or unspecified: Secondary | ICD-10-CM | POA: Diagnosis present

## 2013-02-27 DIAGNOSIS — Z0374 Encounter for suspected problem with fetal growth ruled out: Secondary | ICD-10-CM

## 2013-02-27 DIAGNOSIS — O4703 False labor before 37 completed weeks of gestation, third trimester: Secondary | ICD-10-CM

## 2013-02-27 DIAGNOSIS — Z720 Tobacco use: Secondary | ICD-10-CM

## 2013-02-27 DIAGNOSIS — O99334 Smoking (tobacco) complicating childbirth: Secondary | ICD-10-CM | POA: Diagnosis present

## 2013-02-27 DIAGNOSIS — O4290 Premature rupture of membranes, unspecified as to length of time between rupture and onset of labor, unspecified weeks of gestation: Principal | ICD-10-CM | POA: Diagnosis present

## 2013-02-27 LAB — CBC
HCT: 36.7 % (ref 36.0–46.0)
MCHC: 34.1 g/dL (ref 30.0–36.0)
Platelets: 183 10*3/uL (ref 150–400)
RBC: 4.05 MIL/uL (ref 3.87–5.11)
RDW: 14.1 % (ref 11.5–15.5)
WBC: 15 10*3/uL — ABNORMAL HIGH (ref 4.0–10.5)

## 2013-02-27 MED ORDER — OXYTOCIN 40 UNITS IN LACTATED RINGERS INFUSION - SIMPLE MED: 1.0000 m[IU]/min | INTRAVENOUS | Status: DC

## 2013-02-27 MED ORDER — OXYCODONE-ACETAMINOPHEN 5-325 MG PO TABS
1.0000 | ORAL_TABLET | ORAL | Status: DC | PRN
Start: 2013-02-27 — End: 2013-02-28

## 2013-02-27 MED ORDER — OXYTOCIN 40 UNITS IN LACTATED RINGERS INFUSION - SIMPLE MED
1.0000 m[IU]/min | INTRAVENOUS | Status: DC
  Administered 2013-02-27: 2 m[IU]/min via INTRAVENOUS
  Filled 2013-02-27: qty 1000

## 2013-02-27 MED ORDER — OXYTOCIN 40 UNITS IN LACTATED RINGERS INFUSION - SIMPLE MED: 62.5000 mL/h | INTRAVENOUS | Status: DC

## 2013-02-27 MED ORDER — OXYTOCIN BOLUS FROM INFUSION: 500.0000 mL | INTRAVENOUS | Status: DC

## 2013-02-27 MED ORDER — LACTATED RINGERS IV SOLN
INTRAVENOUS | Status: DC
  Administered 2013-02-27 – 2013-02-28 (×2): via INTRAVENOUS

## 2013-02-27 MED ORDER — CITRIC ACID-SODIUM CITRATE 334-500 MG/5ML PO SOLN: 30.0000 mL | ORAL | Status: DC | PRN

## 2013-02-27 MED ORDER — LIDOCAINE HCL (PF) 1 % IJ SOLN
30.0000 mL | INTRAMUSCULAR | Status: DC | PRN
  Administered 2013-02-28: 30 mL via SUBCUTANEOUS
  Filled 2013-02-27 (×2): qty 30

## 2013-02-27 MED ORDER — TERBUTALINE SULFATE 1 MG/ML IJ SOLN: 0.2500 mg | Freq: Once | INTRAMUSCULAR | Status: AC | PRN

## 2013-02-27 MED ORDER — IBUPROFEN 600 MG PO TABS: 600.0000 mg | ORAL_TABLET | Freq: Four times a day (QID) | ORAL | Status: DC | PRN

## 2013-02-27 MED ORDER — FLEET ENEMA 7-19 GM/118ML RE ENEM: 1.0000 | ENEMA | RECTAL | Status: DC | PRN

## 2013-02-27 MED ORDER — ONDANSETRON HCL 4 MG/2ML IJ SOLN: 4.0000 mg | Freq: Four times a day (QID) | INTRAMUSCULAR | Status: DC | PRN

## 2013-02-27 MED ORDER — PENICILLIN G POTASSIUM 5000000 UNITS IJ SOLR
5.0000 10*6.[IU] | Freq: Once | INTRAVENOUS | Status: AC
  Administered 2013-02-27: 5 10*6.[IU] via INTRAVENOUS
  Filled 2013-02-27: qty 5

## 2013-02-27 MED ORDER — LACTATED RINGERS IV SOLN
500.0000 mL | INTRAVENOUS | Status: DC | PRN
  Administered 2013-02-28: 500 mL via INTRAVENOUS

## 2013-02-27 MED ORDER — PENICILLIN G POTASSIUM 5000000 UNITS IJ SOLR
2.5000 10*6.[IU] | INTRAVENOUS | Status: DC
  Administered 2013-02-27 – 2013-02-28 (×2): 2.5 10*6.[IU] via INTRAVENOUS
  Filled 2013-02-27 (×5): qty 2.5

## 2013-02-27 MED ORDER — ACETAMINOPHEN 325 MG PO TABS: 650.0000 mg | ORAL_TABLET | ORAL | Status: DC | PRN

## 2013-02-27 NOTE — MAU Note (Signed)
Pt states her water broke about 1650

## 2013-02-27 NOTE — H&P (Addendum)
Tami Foster is a 32 y.o. female presenting for rupture of membranes. Maternal Medical History:  Reason for admission: Rupture of membranes.   Fetal activity: Perceived fetal activity is normal.    Prenatal complications: Lagging fetal growth; AC @ 9%, normal U/A doppler  Prenatal Complications - Diabetes: none.    OB History   Grav Para Term Preterm Abortions TAB SAB Ect Mult Living   2    1  1    0     Past Medical History  Diagnosis Date  . Abnormal Pap smear   . GERD (gastroesophageal reflux disease)    Past Surgical History  Procedure Laterality Date  . Periurethral abscess drainage      abscess removed from tonsils. Tonsils not removed  . Leep    . Wisdom tooth extraction     Family History: family history includes Bladder Cancer in an other family member; Cancer in her mother; Diabetes in her paternal grandmother; Emphysema in her father; Heart disease in an other family member; Hypertension in her mother. There is no history of Other. Social History:  reports that she has been smoking Cigarettes.  She has a 7 pack-year smoking history. She has never used smokeless tobacco. She reports that she does not drink alcohol or use illicit drugs.     Review of Systems  Constitutional: Negative for fever.  Eyes: Negative for blurred vision.  Respiratory: Negative for shortness of breath.   Gastrointestinal: Negative for vomiting.  Skin: Negative for rash.  Neurological: Negative for headaches.    Dilation: 1.5 Effacement (%): 70 Exam by:: AL rinehart RN Blood pressure 132/90, pulse 103, temperature 98.8 F (37.1 C), temperature source Oral, resp. rate 18, height 5\' 2"  (1.575 m), weight 74.39 kg (164 lb), last menstrual period 05/27/2012. Maternal Exam:  Uterine Assessment: Contraction frequency is rare.   Abdomen: Patient reports no abdominal tenderness. Fetal presentation: vertex  Introitus: not evaluated.   Cervix: Cervix evaluated by digital exam.      Fetal Exam Fetal Monitor Review: Variability: moderate (6-25 bpm).   Pattern: accelerations present and no decelerations.    Fetal State Assessment: Category I - tracings are normal.     Physical Exam  Constitutional: She appears well-developed.  HENT:  Head: Normocephalic.  Neck: Neck supple. No thyromegaly present.  Cardiovascular: Normal rate and regular rhythm.   Respiratory: Breath sounds normal.  GI: Soft. Bowel sounds are normal.  Skin: No rash noted.    Prenatal labs: ABO, Rh: B/POS/-- (04/07 1212) Antibody: NEG (04/07 1212) Rubella: 7.42 (04/07 1212) RPR: NON REAC (08/07 1106)  HBsAg: NEGATIVE (04/07 1212)  HIV: NON REACTIVE (08/07 1106)    Assessment/Plan: IUP @ [redacted]w[redacted]d.  PPROM.  No overt signs of infection.  Category I FHT.  Admit PCN GBS prophylaxis Augmentation of labor--low dose Pitocin per protocol   JACKSON-MOORE,Latish Toutant A 02/27/2013, 8:20 PM

## 2013-02-28 ENCOUNTER — Encounter (HOSPITAL_COMMUNITY): Payer: Medicaid Other | Admitting: Anesthesiology

## 2013-02-28 ENCOUNTER — Encounter (HOSPITAL_COMMUNITY): Payer: Self-pay | Admitting: Anesthesiology

## 2013-02-28 ENCOUNTER — Inpatient Hospital Stay (HOSPITAL_COMMUNITY): Payer: Medicaid Other | Admitting: Anesthesiology

## 2013-02-28 LAB — RPR: RPR Ser Ql: NONREACTIVE

## 2013-02-28 MED ORDER — FERROUS SULFATE 325 (65 FE) MG PO TABS
325.0000 mg | ORAL_TABLET | Freq: Two times a day (BID) | ORAL | Status: DC
Start: 2013-02-28 — End: 2013-03-01
  Administered 2013-02-28 (×2): 325 mg via ORAL
  Filled 2013-02-28 (×3): qty 1

## 2013-02-28 MED ORDER — DIPHENHYDRAMINE HCL 50 MG/ML IJ SOLN: 12.5000 mg | INTRAMUSCULAR | Status: DC | PRN

## 2013-02-28 MED ORDER — DIPHENHYDRAMINE HCL 25 MG PO CAPS: 25.0000 mg | ORAL_CAPSULE | Freq: Four times a day (QID) | ORAL | Status: DC | PRN

## 2013-02-28 MED ORDER — ONDANSETRON HCL 4 MG/2ML IJ SOLN: 4.0000 mg | INTRAMUSCULAR | Status: DC | PRN

## 2013-02-28 MED ORDER — LIDOCAINE HCL (PF) 1 % IJ SOLN
INTRAMUSCULAR | Status: DC | PRN
  Administered 2013-02-28 (×2): 4 mL

## 2013-02-28 MED ORDER — OXYCODONE-ACETAMINOPHEN 5-325 MG PO TABS: 1.0000 | ORAL_TABLET | ORAL | Status: DC | PRN

## 2013-02-28 MED ORDER — LANOLIN HYDROUS EX OINT: TOPICAL_OINTMENT | CUTANEOUS | Status: DC | PRN

## 2013-02-28 MED ORDER — MEASLES, MUMPS & RUBELLA VAC ~~LOC~~ INJ
0.5000 mL | INJECTION | Freq: Once | SUBCUTANEOUS | Status: DC
  Filled 2013-02-28: qty 0.5

## 2013-02-28 MED ORDER — SENNOSIDES-DOCUSATE SODIUM 8.6-50 MG PO TABS
2.0000 | ORAL_TABLET | ORAL | Status: DC
  Administered 2013-03-01: 2 via ORAL
  Filled 2013-02-28: qty 2

## 2013-02-28 MED ORDER — WITCH HAZEL-GLYCERIN EX PADS: 1.0000 "application " | MEDICATED_PAD | CUTANEOUS | Status: DC | PRN

## 2013-02-28 MED ORDER — FENTANYL 2.5 MCG/ML BUPIVACAINE 1/10 % EPIDURAL INFUSION (WH - ANES)
14.0000 mL/h | INTRAMUSCULAR | Status: DC | PRN
  Administered 2013-02-28: 14 mL/h via EPIDURAL
  Filled 2013-02-28 (×2): qty 125

## 2013-02-28 MED ORDER — EPHEDRINE 5 MG/ML INJ
10.0000 mg | INTRAVENOUS | Status: DC | PRN
  Filled 2013-02-28: qty 4
  Filled 2013-02-28: qty 2

## 2013-02-28 MED ORDER — DIBUCAINE 1 % RE OINT: 1.0000 "application " | TOPICAL_OINTMENT | RECTAL | Status: DC | PRN

## 2013-02-28 MED ORDER — BENZOCAINE-MENTHOL 20-0.5 % EX AERO
1.0000 "application " | INHALATION_SPRAY | CUTANEOUS | Status: DC | PRN
  Administered 2013-02-28: 1 via TOPICAL
  Filled 2013-02-28: qty 56

## 2013-02-28 MED ORDER — TETANUS-DIPHTH-ACELL PERTUSSIS 5-2.5-18.5 LF-MCG/0.5 IM SUSP: 0.5000 mL | Freq: Once | INTRAMUSCULAR | Status: DC

## 2013-02-28 MED ORDER — PHENYLEPHRINE 40 MCG/ML (10ML) SYRINGE FOR IV PUSH (FOR BLOOD PRESSURE SUPPORT)
80.0000 ug | PREFILLED_SYRINGE | INTRAVENOUS | Status: DC | PRN
  Filled 2013-02-28: qty 2

## 2013-02-28 MED ORDER — EPHEDRINE 5 MG/ML INJ
10.0000 mg | INTRAVENOUS | Status: DC | PRN
  Filled 2013-02-28: qty 2

## 2013-02-28 MED ORDER — PHENYLEPHRINE 40 MCG/ML (10ML) SYRINGE FOR IV PUSH (FOR BLOOD PRESSURE SUPPORT)
80.0000 ug | PREFILLED_SYRINGE | INTRAVENOUS | Status: DC | PRN
  Filled 2013-02-28: qty 5
  Filled 2013-02-28: qty 2

## 2013-02-28 MED ORDER — MAGNESIUM HYDROXIDE 400 MG/5ML PO SUSP: 30.0000 mL | ORAL | Status: DC | PRN

## 2013-02-28 MED ORDER — ONDANSETRON HCL 4 MG PO TABS: 4.0000 mg | ORAL_TABLET | ORAL | Status: DC | PRN

## 2013-02-28 MED ORDER — IBUPROFEN 600 MG PO TABS
600.0000 mg | ORAL_TABLET | Freq: Four times a day (QID) | ORAL | Status: DC
  Administered 2013-02-28 – 2013-03-01 (×3): 600 mg via ORAL
  Filled 2013-02-28 (×3): qty 1

## 2013-02-28 MED ORDER — PRENATAL MULTIVITAMIN CH
1.0000 | ORAL_TABLET | Freq: Every day | ORAL | Status: DC
  Administered 2013-02-28: 1 via ORAL
  Filled 2013-02-28: qty 1

## 2013-02-28 MED ORDER — ZOLPIDEM TARTRATE 5 MG PO TABS: 5.0000 mg | ORAL_TABLET | Freq: Every evening | ORAL | Status: DC | PRN

## 2013-02-28 MED ORDER — FENTANYL 2.5 MCG/ML BUPIVACAINE 1/10 % EPIDURAL INFUSION (WH - ANES)
INTRAMUSCULAR | Status: DC | PRN
  Administered 2013-02-28: 14 mL/h via EPIDURAL

## 2013-02-28 MED ORDER — LACTATED RINGERS IV SOLN: 500.0000 mL | Freq: Once | INTRAVENOUS | Status: DC

## 2013-02-28 NOTE — Progress Notes (Signed)
UR completed 

## 2013-02-28 NOTE — Lactation Note (Signed)
This note was copied from the chart of Boy Venita Seng. Lactation Consultation Note   Initial consult with this mom of a 34 2/[redacted] weeks gestation baby, now 5 hours post partum. Mom wants to breast feed, so I started her pumping with a DEP. Teaching done form NICU booklet. Hand expression taught to mom - she will need help with this. Mom pumped 1-2 mls of colostrum with first pumping. Mom has Medicaid and knows to call to apply for [facility], so she can get a loaner DEP prior to discharge.   Mom's best friend was in her room while I was teaching mom about pumping. Mom's  friend  has had 2 babies in the NICU, and is familiar with pumping, so she should be a good support person for mom. Mom encouraged to do skin to skin. She knows to call for questions/concerns.   Patient Name: Boy Willma Obando RUEAV'W Date: 02/28/2013 Reason for consult: Initial assessment;NICU baby   Maternal Data Formula Feeding for Exclusion: Yes (baby in NICU) Infant to breast within first hour of birth: No Breastfeeding delayed due to:: Infant status Has patient been taught Hand Expression?: Yes Does the patient have breastfeeding experience prior to this delivery?: No  Feeding Feeding Type: Formula Length of feed: 30 min  LATCH Score/Interventions                      Lactation Tools Discussed/Used Tools: Pump Breast pump type: Double-Electric Breast Pump WIC Program: Yes (mom has medicaid - will call to apply for wic) Pump Review: Milk Storage;Other (comment);Setup, frequency, and cleaning (hand expression, NICU booklet review on how to provide EBM for a NICU baby) Initiated by:: c Quamel Fitzmaurice RN at 5 hours post partum Date initiated:: 02/28/13 (at 1100)   Consult Status Consult Status: Follow-up Date: 03/01/13 Follow-up type: In-patient    Alfred Levins 02/28/2013, 11:35 AM

## 2013-02-28 NOTE — Anesthesia Postprocedure Evaluation (Signed)
  Anesthesia Post-op Note  Anesthesia Post Note  Patient: Tami Foster  Procedure(s) Performed: * No procedures listed *  Anesthesia type: Epidural  Patient location: Mother/Baby  Post pain: Pain level controlled  Post assessment: Post-op Vital signs reviewed  Last Vitals:  Filed Vitals:   02/28/13 0800  BP: 131/82  Pulse: 92  Temp: 36.6 C  Resp: 18    Post vital signs: Reviewed  Level of consciousness:alert  Complications: No apparent anesthesia complications

## 2013-02-28 NOTE — Anesthesia Preprocedure Evaluation (Signed)
Anesthesia Evaluation  Patient identified by MRN, date of birth, ID band Patient awake    Reviewed: Allergy & Precautions, H&P , Patient's Chart, lab work & pertinent test results  Airway Mallampati: III TM Distance: >3 FB Neck ROM: full    Dental no notable dental hx. (+) Teeth Intact   Pulmonary Current Smoker,  breath sounds clear to auscultation  Pulmonary exam normal       Cardiovascular negative cardio ROS  Rhythm:regular Rate:Normal     Neuro/Psych negative neurological ROS  negative psych ROS   GI/Hepatic Neg liver ROS, GERD-  Medicated and Controlled,  Endo/Other  negative endocrine ROS  Renal/GU negative Renal ROS  negative genitourinary   Musculoskeletal   Abdominal   Peds  Hematology negative hematology ROS (+)   Anesthesia Other Findings   Reproductive/Obstetrics (+) Pregnancy                           Anesthesia Physical Anesthesia Plan  ASA: II  Anesthesia Plan: Epidural   Post-op Pain Management:    Induction:   Airway Management Planned:   Additional Equipment:   Intra-op Plan:   Post-operative Plan:   Informed Consent: I have reviewed the patients History and Physical, chart, labs and discussed the procedure including the risks, benefits and alternatives for the proposed anesthesia with the patient or authorized representative who has indicated his/her understanding and acceptance.     Plan Discussed with: Anesthesiologist  Anesthesia Plan Comments:         Anesthesia Quick Evaluation

## 2013-02-28 NOTE — Anesthesia Procedure Notes (Signed)
Epidural Patient location during procedure: OB Start time: 02/28/2013 12:54 AM  Staffing Anesthesiologist: Danylle Ouk A. Performed by: anesthesiologist   Preanesthetic Checklist Completed: patient identified, site marked, surgical consent, pre-op evaluation, timeout performed, IV checked, risks and benefits discussed and monitors and equipment checked  Epidural Patient position: sitting Prep: site prepped and draped and DuraPrep Patient monitoring: continuous pulse ox and blood pressure Approach: midline Injection technique: LOR air  Needle:  Needle type: Tuohy  Needle gauge: 17 G Needle length: 9 cm and 9 Needle insertion depth: 5 cm cm Catheter type: closed end flexible Catheter size: 19 Gauge Catheter at skin depth: 10 cm Test dose: negative and Other  Assessment Events: blood not aspirated, injection not painful, no injection resistance, negative IV test and no paresthesia  Additional Notes Patient identified. Risks and benefits discussed including failed block, incomplete  Pain control, post dural puncture headache, nerve damage, paralysis, blood pressure Changes, nausea, vomiting, reactions to medications-both toxic and allergic and post Partum back pain. All questions were answered. Patient expressed understanding and wished to proceed. Sterile technique was used throughout procedure. Epidural site was Dressed with sterile barrier dressing. No paresthesias, signs of intravascular injection Or signs of intrathecal spread were encountered.  Patient was more comfortable after the epidural was dosed. Please see RN's note for documentation of vital signs and FHR which are stable.

## 2013-03-01 MED ORDER — NORETHINDRONE 0.35 MG PO TABS: 1.0000 | ORAL_TABLET | Freq: Every day | ORAL | Status: DC

## 2013-03-01 MED ORDER — BUTALBITAL-APAP-CAFFEINE 50-325-40 MG PO TABS: 1.0000 | ORAL_TABLET | Freq: Four times a day (QID) | ORAL | Status: DC | PRN

## 2013-03-01 NOTE — Discharge Summary (Signed)
  Obstetric Discharge Summary Reason for Admission: onset of labor Prenatal Procedures: none Intrapartum Procedures: spontaneous vaginal delivery Postpartum Procedures: none Complications-Operative and Postpartum: none  Hemoglobin  Date Value Range Status  02/27/2013 12.5  12.0 - 15.0 g/dL Final     HCT  Date Value Range Status  02/27/2013 36.7  36.0 - 46.0 % Final    Physical Exam:  General: alert Lochia: appropriate Uterine: firm Incision: n/a DVT Evaluation: No evidence of DVT seen on physical exam.  Discharge Diagnoses: Active Problems:   Normal delivery   Discharge Information: Date: 03/01/2013 Activity: pelvic rest Diet: routine Medications:  Prior to Admission medications   Medication Sig Start Date End Date Taking? Authorizing Provider  calcium carbonate (TUMS - DOSED IN MG ELEMENTAL CALCIUM) 500 MG chewable tablet Chew 2 tablets by mouth 3 (three) times daily as needed for heartburn.    Yes Historical Provider, MD  Prenatal Vit-Fe Fumarate-FA (PRENATAL MULTIVITAMIN) TABS Take 1 tablet by mouth daily. 09/19/12  Yes Antionette Char, MD  butalbital-acetaminophen-caffeine (FIORICET) [phone] MG per tablet Take 1-2 tablets by mouth every 6 (six) hours as needed for headache. 03/01/13 03/01/14  Antionette Char, MD  norethindrone (ORTHO MICRONOR) 0.35 MG tablet Take 1 tablet (0.35 mg total) by mouth daily. 03/01/13   Antionette Char, MD    Condition: stable Instructions: refer to routine discharge instructions Discharge to: home   Newborn Data:  Live born female  Birth Weight: 4 lb 10.8 oz (2121 g) APGAR: 9, 9   Home with mother.  JACKSON-MOORE,Akim Watkinson A 03/01/2013, 10:23 AM

## 2013-03-06 ENCOUNTER — Ambulatory Visit: Payer: Self-pay

## 2013-03-06 NOTE — Lactation Note (Signed)
This note was copied from the chart of Tami Elvis Boot. Lactation Consultation Note FOB present in rooming-in room with baby. States that mother has just gone outside. FOB called mom on the phone to ask if she wanted to see [facility], mom stated no but would speak on the phone.  Per phone conversation, mom states she is pumping every 4 hours instead of every 3 because she feels she gets more milk that way. Mom states she gets about 100 mL when she pumps. Enc mom to continue pumping and hand expression, every 4 hours if that is yielding enough milk for baby, to increase frequency of pumping if she has any concerns about her milk supply not being enough.  Discussed latching baby directly to the breast, mom states she is not ready, states she wants to wait until baby is a little older and bigger. Offered to set up o/p appt for latch assistance, mom states she will call the lactation office when she is ready for latch assistance. Enc mom to attend the [facility]. Lactation office number given to FOB. All questions answered.   Patient Name: Tami Foster Tami Foster'U Date: 03/06/2013     Maternal Data    Feeding    LATCH Score/Interventions                      Lactation Tools Discussed/Used     Consult Status      Lenard Forth 03/06/2013, 10:43 AM

## 2013-03-07 ENCOUNTER — Encounter: Payer: Self-pay | Admitting: Obstetrics & Gynecology

## 2013-03-07 ENCOUNTER — Encounter: Payer: Medicaid Other | Admitting: Obstetrics & Gynecology

## 2013-03-10 ENCOUNTER — Encounter (HOSPITAL_COMMUNITY): Payer: Self-pay | Admitting: *Deleted

## 2013-03-10 ENCOUNTER — Other Ambulatory Visit: Payer: Self-pay | Admitting: Obstetrics & Gynecology

## 2013-03-10 ENCOUNTER — Inpatient Hospital Stay (HOSPITAL_COMMUNITY)
Admission: AD | Admit: 2013-03-10 | Discharge: 2013-03-10 | Disposition: A | Discharge status: Home / Self Care | Payer: Medicaid Other | Source: Ambulatory Visit | Attending: Obstetrics & Gynecology | Admitting: Obstetrics & Gynecology

## 2013-03-10 DIAGNOSIS — O99893 Other specified diseases and conditions complicating puerperium: Secondary | ICD-10-CM | POA: Insufficient documentation

## 2013-03-10 DIAGNOSIS — R102 Pelvic and perineal pain: Secondary | ICD-10-CM

## 2013-03-10 DIAGNOSIS — N949 Unspecified condition associated with female genital organs and menstrual cycle: Secondary | ICD-10-CM | POA: Insufficient documentation

## 2013-03-10 LAB — CBC WITH DIFFERENTIAL/PLATELET
Basophils Absolute: 0 10*3/uL (ref 0.0–0.1)
Basophils Relative: 0 % (ref 0–1)
Eosinophils Absolute: 0.1 10*3/uL (ref 0.0–0.7)
Hemoglobin: 14 g/dL (ref 12.0–15.0)
MCH: 31.5 pg (ref 26.0–34.0)
MCHC: 34.5 g/dL (ref 30.0–36.0)
MCV: 91.4 fL (ref 78.0–100.0)
Monocytes Absolute: 0.9 10*3/uL (ref 0.1–1.0)
Monocytes Relative: 7 % (ref 3–12)
Neutro Abs: 9.5 10*3/uL — ABNORMAL HIGH (ref 1.7–7.7)
Neutrophils Relative %: 70 % (ref 43–77)
Platelets: 261 10*3/uL (ref 150–400)
RDW: 13.8 % (ref 11.5–15.5)

## 2013-03-10 LAB — WET PREP, GENITAL: Trich, Wet Prep: NONE SEEN

## 2013-03-10 MED ORDER — OXYCODONE-ACETAMINOPHEN 5-325 MG PO TABS
2.0000 | ORAL_TABLET | Freq: Once | ORAL | Status: AC
  Administered 2013-03-10: 2 via ORAL
  Filled 2013-03-10: qty 2

## 2013-03-10 MED ORDER — AMOXICILLIN-POT CLAVULANATE 875-125 MG PO TABS
1.0000 | ORAL_TABLET | Freq: Once | ORAL | Status: AC
  Administered 2013-03-10: 1 via ORAL
  Filled 2013-03-10: qty 1

## 2013-03-10 MED ORDER — AMOXICILLIN-POT CLAVULANATE 875-125 MG PO TABS: 1.0000 | ORAL_TABLET | Freq: Two times a day (BID) | ORAL | Status: DC

## 2013-03-10 NOTE — MAU Note (Signed)
Pt reports she had a vaginal delivery on 10/23 and has vaginal pain since delivery but it has worsened. Vaginal discharge since yesterday with odor.

## 2013-03-10 NOTE — MAU Provider Note (Signed)
History     CSN: [id number]  Arrival date and time: 03/10/13 1954   First Provider Initiated Contact with Patient 03/10/13 2140      Chief Complaint  Patient presents with  . Vaginal Pain  . Vaginal Discharge   Vaginal Pain The patient's primary symptoms include a vaginal discharge.  Vaginal Discharge The patient's primary symptoms include a vaginal discharge.    Tami Foster is a 32 y.o. [phone] who is 2 weeks S/P NSVD. She states that she has had vaginal pain and pressure since the delivery, but on Friday it worsened. She also began to notice a foul smelling discharge. She denies any abdominal pain, fever, or dysuria. She states that her bleeding has become only spotting.   Past Medical History  Diagnosis Date  . Abnormal Pap smear   . GERD (gastroesophageal reflux disease)     Past Surgical History  Procedure Laterality Date  . Periurethral abscess drainage      abscess removed from tonsils. Tonsils not removed  . Leep    . Wisdom tooth extraction      Family History  Problem Relation Age of Onset  . Other Neg Hx   . Bladder Cancer    . Heart disease    . Cancer Mother   . Hypertension Mother   . Emphysema Father   . Diabetes Paternal Grandmother     History  Substance Use Topics  . Smoking status: Current Some Day Smoker -- 0.50 packs/day for 14 years    Types: Cigarettes  . Smokeless tobacco: Never Used  . Alcohol Use: No    Allergies: No Known Allergies  Prescriptions prior to admission  Medication Sig Dispense Refill  . acetaminophen (TYLENOL) 325 MG tablet Take 650 mg by mouth every 6 (six) hours as needed for pain.      . butalbital-acetaminophen-caffeine (FIORICET) 50-325-40 MG per tablet Take 1-2 tablets by mouth every 6 (six) hours as needed for headache.  30 tablet  0  . Prenatal Vit-Fe Fumarate-FA (PRENATAL MULTIVITAMIN) TABS Take 1 tablet by mouth daily.  60 tablet  5  . norethindrone (ORTHO MICRONOR) 0.35 MG tablet Take 1 tablet  (0.35 mg total) by mouth daily.  28 tablet  11    Review of Systems  Genitourinary: Positive for vaginal discharge and vaginal pain.   Physical Exam   Blood pressure 126/87, pulse 103, temperature 98.8 F (37.1 C), temperature source Oral, resp. rate 18, height 5\' 2"  (1.575 m), weight 71.215 kg (157 lb), last menstrual period 05/27/2012, SpO2 98.00%.  Physical Exam  Nursing note and vitals reviewed. Constitutional: She is oriented to person, place, and time. She appears well-developed and well-nourished. No distress.  Cardiovascular: Normal rate.   Respiratory: Effort normal.  GI: Soft. There is no tenderness.  Genitourinary:  Uterus: non-tender Moderate amount of tan discharge. Suture on the right labia intact: no erythema.   Neurological: She is alert and oriented to person, place, and time.  Skin: Skin is warm and dry.  Psychiatric: She has a normal mood and affect.    MAU Course  Procedures  Results for orders placed during the hospital encounter of 03/10/13 (from the past 24 hour(s))  CBC WITH DIFFERENTIAL     Status: Abnormal   Collection Time    03/10/13  8:50 PM      Result Value Range   WBC 13.6 (*) 4.0 - 10.5 K/uL   RBC 4.44  3.87 - 5.11 MIL/uL   Hemoglobin 14.0  12.0 - 15.0 g/dL   HCT 96.0  45.4 - 09.8 %   MCV 91.4  78.0 - 100.0 fL   MCH 31.5  26.0 - 34.0 pg   MCHC 34.5  30.0 - 36.0 g/dL   RDW 11.9  14.7 - 82.9 %   Platelets 261  150 - 400 K/uL   Neutrophils Relative % 70  43 - 77 %   Neutro Abs 9.5 (*) 1.7 - 7.7 K/uL   Lymphocytes Relative 22  12 - 46 %   Lymphs Abs 3.1  0.7 - 4.0 K/uL   Monocytes Relative 7  3 - 12 %   Monocytes Absolute 0.9  0.1 - 1.0 K/uL   Eosinophils Relative 1  0 - 5 %   Eosinophils Absolute 0.1  0.0 - 0.7 K/uL   Basophils Relative 0  0 - 1 %   Basophils Absolute 0.0  0.0 - 0.1 K/uL  WET PREP, GENITAL     Status: Abnormal   Collection Time    03/10/13  9:45 PM      Result Value Range   Yeast Wet Prep HPF POC NONE SEEN  NONE  SEEN   Trich, Wet Prep NONE SEEN  NONE SEEN   Clue Cells Wet Prep HPF POC NONE SEEN  NONE SEEN   WBC, Wet Prep HPF POC FEW (*) NONE SEEN    2218: D/W Dr. Clearance Coots will rx augmentin 875 BID X 1 week, FU with Dr. Tina Griffiths this week.   Assessment and Plan   1. Vaginal pain    RX: Augmentin BID  Call the office for an appointment for FU this week Return to MAU as needed    Tawnya Crook 03/10/2013, 9:47 PM

## 2013-03-11 NOTE — Telephone Encounter (Signed)
Please review

## 2013-03-14 ENCOUNTER — Other Ambulatory Visit: Payer: Self-pay

## 2013-03-14 ENCOUNTER — Encounter: Payer: Self-pay | Admitting: Obstetrics & Gynecology

## 2013-03-14 ENCOUNTER — Ambulatory Visit (INDEPENDENT_AMBULATORY_CARE_PROVIDER_SITE_OTHER): Payer: Medicaid Other | Admitting: Obstetrics & Gynecology

## 2013-03-14 DIAGNOSIS — F53 Postpartum depression: Secondary | ICD-10-CM

## 2013-03-14 NOTE — Patient Instructions (Signed)

## 2013-03-14 NOTE — Progress Notes (Signed)
Subjective:     Tami Foster is a 32 y.o. female who presents for a postpartum visit. She is 2 weeks postpartum following a spontaneous vaginal delivery. I have fully reviewed the prenatal and intrapartum course. The delivery was at 34 gestational weeks. Outcome: spontaneous vaginal delivery. Anesthesia: epidural. Postpartum course has been WNL. Baby's course has been WNL Baby is feeding by both breast and bottle - Similac Neosure. Bleeding thin lochia. Bowel function is normal. Bladder function is normal. Patient is not sexually active. Contraception method is abstinence. Postpartum depression screening: positive.  She presented to MAU with pain--?vulva.  Treated for an infection.  The following portions of the patient's history were reviewed and updated as appropriate: allergies, current medications, past family history, past medical history, past social history, past surgical history and problem list.  Review of Systems Pertinent items are noted in HPI.   Objective:    BP 137/92  Pulse 84  Temp(Src) 97.8 F (36.6 C)  Wt 154 lb (69.854 kg)  LMP 05/27/2012  Breastfeeding? Yes        Assessment:   Likely postpartum blues--limited support for care of the infant  Plan:     Contraception: oral progesterone-only contraceptive  Follow udp in: 2 weeks or as needed.

## 2013-03-28 ENCOUNTER — Encounter: Payer: Self-pay | Admitting: Advanced Practice Midwife

## 2013-03-28 ENCOUNTER — Ambulatory Visit (INDEPENDENT_AMBULATORY_CARE_PROVIDER_SITE_OTHER): Payer: Medicaid Other | Admitting: Advanced Practice Midwife

## 2013-03-28 VITALS — BP 135/87 | HR 105 | Temp 98.8°F | Ht 62.0 in | Wt 153.0 lb

## 2013-03-28 DIAGNOSIS — O99345 Other mental disorders complicating the puerperium: Secondary | ICD-10-CM

## 2013-03-28 DIAGNOSIS — F53 Postpartum depression: Secondary | ICD-10-CM

## 2013-03-28 DIAGNOSIS — F329 Major depressive disorder, single episode, unspecified: Secondary | ICD-10-CM

## 2013-03-28 DIAGNOSIS — Z0000 Encounter for general adult medical examination without abnormal findings: Secondary | ICD-10-CM

## 2013-03-28 LAB — POCT URINALYSIS DIPSTICK
Bilirubin, UA: NEGATIVE
Blood, UA: NEGATIVE
Glucose, UA: NEGATIVE
Protein, UA: NEGATIVE
Spec Grav, UA: <=1.005
Urobilinogen, UA: NEGATIVE

## 2013-03-28 MED ORDER — SERTRALINE HCL 25 MG PO TABS: 50.0000 mg | ORAL_TABLET | Freq: Every day | ORAL | Status: DC

## 2013-03-28 NOTE — Progress Notes (Signed)
Subjective:     Tami Foster is a 32 y.o. female who presents for a postpartum visit. She is 4 weeks postpartum following a spontaneous vaginal delivery. I have fully reviewed the prenatal and intrapartum course. The delivery was at 34 gestational weeks. Outcome: spontaneous vaginal delivery. Anesthesia: epidural. Postpartum course has been normal. Baby's course has been normal. Baby is feeding by bottle - Carnation Good Start DHA and ARA and enfamil gentle ease. Bleeding no bleeding, reports yellow discharge.  Bowel function is normal. Bladder function is normal. Patient is not sexually active. Contraception method is OCP (estrogen/progesterone). Postpartum depression screening: score of 5 / mild depression.  Patient's baby is fussy and must be held continuously. She reports he has not taken the other formulas that were to help his symptoms. She is needing to have him in the carseat or holding him constantly. She has not had much seperation from him since birth. He is being closely monitored by pediatricians. He is growing and thriving however appears colicky.   The following portions of the patient's history were reviewed and updated as appropriate: allergies, current medications, past family history, past medical history, past social history, past surgical history and problem list.  Review of Systems A comprehensive review of systems was negative.   Objective:    BP 135/87  Pulse 105  Temp(Src) 98.8 F (37.1 C)  Ht 5\' 2"  (1.575 m)  Wt 153 lb (69.4 kg)  BMI 27.98 kg/m2  General:  alert, cooperative, fatigued and mild distress       Assessment:     4 week postpartum exam. Pap smear not done at today's visit.  Postpartum Depression Bottle Feeding OCP for BCM  Plan:    1. Contraception: OCP (estrogen/progesterone) 2. Patient experiencing mild postpartum anxiety and depression. I recommend zoloft at this time, 50 mg PO daily. Consider increasing to 100 mg daily based on patient's  therapeutic results. Discussed risk and benefits of medication. Gave handout. Cont to reevaluate. Refer patient to counseling services PRN. 3. Follow up in: 1 month or as needed.  4. Reviewed measures to help cope w/ colicky newborn symptoms.   20 min spent with patient greater than 80% spent in counseling and coordination of care.   Maylee Bare Wilson Singer CNM

## 2013-04-18 ENCOUNTER — Ambulatory Visit: Payer: Medicaid Other | Admitting: Obstetrics & Gynecology

## 2013-05-06 ENCOUNTER — Ambulatory Visit (INDEPENDENT_AMBULATORY_CARE_PROVIDER_SITE_OTHER): Payer: Medicaid Other | Admitting: Obstetrics & Gynecology

## 2013-05-06 ENCOUNTER — Ambulatory Visit: Payer: Medicaid Other | Admitting: Obstetrics & Gynecology

## 2013-05-06 ENCOUNTER — Encounter: Payer: Self-pay | Admitting: Obstetrics & Gynecology

## 2013-05-06 DIAGNOSIS — F411 Generalized anxiety disorder: Secondary | ICD-10-CM

## 2013-05-06 DIAGNOSIS — O99345 Other mental disorders complicating the puerperium: Secondary | ICD-10-CM

## 2013-05-06 DIAGNOSIS — F419 Anxiety disorder, unspecified: Secondary | ICD-10-CM | POA: Insufficient documentation

## 2013-05-06 DIAGNOSIS — F329 Major depressive disorder, single episode, unspecified: Secondary | ICD-10-CM

## 2013-05-06 DIAGNOSIS — F53 Postpartum depression: Secondary | ICD-10-CM

## 2013-05-06 LAB — CBC
Hemoglobin: 15.1 g/dL — ABNORMAL HIGH (ref 12.0–15.0)
MCH: 31.1 pg (ref 26.0–34.0)
MCV: 91.2 fL (ref 78.0–100.0)
Platelets: 238 10*3/uL (ref 150–400)
RBC: 4.86 MIL/uL (ref 3.87–5.11)
RDW: 13.9 % (ref 11.5–15.5)
WBC: 10.7 10*3/uL — ABNORMAL HIGH (ref 4.0–10.5)

## 2013-05-06 LAB — COMPREHENSIVE METABOLIC PANEL
ALT: 23 U/L (ref 0–35)
CO2: 27 mEq/L (ref 19–32)
Calcium: 9.5 mg/dL (ref 8.4–10.5)
Chloride: 104 mEq/L (ref 96–112)
Creat: 0.62 mg/dL (ref 0.50–1.10)
Glucose, Bld: 75 mg/dL (ref 70–99)
Sodium: 138 mEq/L (ref 135–145)
Total Bilirubin: 0.5 mg/dL (ref 0.3–1.2)
Total Protein: 6.8 g/dL (ref 6.0–8.3)

## 2013-05-06 LAB — TSH: TSH: 0.32 u[IU]/mL — ABNORMAL LOW (ref 0.350–4.500)

## 2013-05-06 NOTE — Progress Notes (Signed)
Subjective:     Tami Foster is a 32 y.o. female who presents for a postpartum visit. She is 8 weeks postpartum following a spontaneous vaginal delivery. I have fully reviewed the prenatal and intrapartum course. The delivery was at 34 gestational weeks. Outcome: spontaneous vaginal delivery. Anesthesia: epidural. Postpartum course has been doing better. Pt states that things have gotten better since last visit. Baby's course has been doing well. Baby is feeding by bottle - Carnation Good Start-soothe. Bleeding no bleeding. Bowel function is normal. Bladder function is normal. Patient is not sexually active. Contraception method is OCP (estrogen/progesterone).  Pt states that she would like to possibly like to change her birth control to an IUD.   Postpartum depression screening: positive-10. Pt states that she is doing well at home. She is having lots of anxiety while driving or in crowds.  Pt states that she seems distracted a lot of times.  Pt is also having night sweats and is wondering if it's hormone related.  The following portions of the patient's history were reviewed and updated as appropriate: allergies, current medications, past family history, past medical history, past social history, past surgical history and problem list.  Review of Systems Pertinent items are noted in HPI.   Objective:    BP 138/92  Pulse 108  Temp(Src) 98.3 F (36.8 C)  Wt 157 lb (71.215 kg)  LMP 04/08/2013  Breastfeeding? No        General:  alert     Abdomen: soft, non-tender; bowel sounds normal; no masses,  no organomegaly   Vulva:  normal  Vagina: normal vagina  Cervix:  no lesions  Corpus: normal size, contour, position, consistency, mobility, non-tender  Adnexa:  normal adnexa   Assessment:     Normal postpartum exam.  Depressive symptoms with a good response to the SSRI Anxiety  Plan:    1. Contraception: plans a Mirena IUD--return for insertion 2. Recommend f/u w/therapist

## 2013-05-06 NOTE — Patient Instructions (Addendum)
Please schedule appointment for therapist Continue on current dose of Zoloft Follow up in one month to discuss medication and anxiety Levonorgestrel intrauterine device (IUD) What is this medicine? LEVONORGESTREL IUD (LEE voe nor jes trel) is a contraceptive (birth control) device. The device is placed inside the uterus by a healthcare professional. It is used to prevent pregnancy and can also be used to treat heavy bleeding that occurs during your period. Depending on the device, it can be used for 3 to 5 years. This medicine may be used for other purposes; ask your health care provider or pharmacist if you have questions. COMMON BRAND NAME(S): Gretta Cool What should I tell my health care provider before I take this medicine? They need to know if you have any of these conditions: -abnormal Pap smear -cancer of the breast, uterus, or cervix -diabetes -endometritis -genital or pelvic infection now or in the past -have more than one sexual partner or your partner has more than one partner -heart disease -history of an ectopic or tubal pregnancy -immune system problems -IUD in place -liver disease or tumor -problems with blood clots or take blood-thinners -use intravenous drugs -uterus of unusual shape -vaginal bleeding that has not been explained -an unusual or allergic reaction to levonorgestrel, other hormones, silicone, or polyethylene, medicines, foods, dyes, or preservatives -pregnant or trying to get pregnant -breast-feeding How should I use this medicine? This device is placed inside the uterus by a health care professional. Talk to your pediatrician regarding the use of this medicine in children. Special care may be needed. Overdosage: If you think you have taken too much of this medicine contact a poison control center or emergency room at once. NOTE: This medicine is only for you. Do not share this medicine with others. What if I miss a dose? This does not apply. What  may interact with this medicine? Do not take this medicine with any of the following medications: -amprenavir -bosentan -fosamprenavir This medicine may also interact with the following medications: -aprepitant -barbiturate medicines for inducing sleep or treating seizures -bexarotene -griseofulvin -medicines to treat seizures like carbamazepine, ethotoin, felbamate, oxcarbazepine, phenytoin, topiramate -modafinil -pioglitazone -rifabutin -rifampin -rifapentine -some medicines to treat HIV infection like atazanavir, indinavir, lopinavir, nelfinavir, tipranavir, ritonavir -St. John's wort -warfarin This list may not describe all possible interactions. Give your health care provider a list of all the medicines, herbs, non-prescription drugs, or dietary supplements you use. Also tell them if you smoke, drink alcohol, or use illegal drugs. Some items may interact with your medicine. What should I watch for while using this medicine? Visit your doctor or health care professional for regular check ups. See your doctor if you or your partner has sexual contact with others, becomes HIV positive, or gets a sexual transmitted disease. This product does not protect you against HIV infection (AIDS) or other sexually transmitted diseases. You can check the placement of the IUD yourself by reaching up to the top of your vagina with clean fingers to feel the threads. Do not pull on the threads. It is a good habit to check placement after each menstrual period. Call your doctor right away if you feel more of the IUD than just the threads or if you cannot feel the threads at all. The IUD may come out by itself. You may become pregnant if the device comes out. If you notice that the IUD has come out use a backup birth control method like condoms and call your health care provider. Using tampons  will not change the position of the IUD and are okay to use during your period. What side effects may I notice from  receiving this medicine? Side effects that you should report to your doctor or health care professional as soon as possible: -allergic reactions like skin rash, itching or hives, swelling of the face, lips, or tongue -fever, flu-like symptoms -genital sores -high blood pressure -no menstrual period for 6 weeks during use -pain, swelling, warmth in the leg -pelvic pain or tenderness -severe or sudden headache -signs of pregnancy -stomach cramping -sudden shortness of breath -trouble with balance, talking, or walking -unusual vaginal bleeding, discharge -yellowing of the eyes or skin Side effects that usually do not require medical attention (report to your doctor or health care professional if they continue or are bothersome): -acne -breast pain -change in sex drive or performance -changes in weight -cramping, dizziness, or faintness while the device is being inserted -headache -irregular menstrual bleeding within first 3 to 6 months of use -nausea This list may not describe all possible side effects. Call your doctor for medical advice about side effects. You may report side effects to FDA at 1-800-FDA-1088. Where should I keep my medicine? This does not apply. NOTE: This sheet is a summary. It may not cover all possible information. If you have questions about this medicine, talk to your doctor, pharmacist, or health care provider.  2014, [employer]vier/Gold Standard. (2011-05-26 13:54:04)

## 2013-05-07 ENCOUNTER — Encounter: Payer: Self-pay | Admitting: Advanced Practice Midwife

## 2013-05-07 ENCOUNTER — Ambulatory Visit (INDEPENDENT_AMBULATORY_CARE_PROVIDER_SITE_OTHER): Payer: Medicaid Other | Admitting: Advanced Practice Midwife

## 2013-05-07 VITALS — BP 129/82 | HR 90 | Temp 98.7°F | Ht 62.0 in | Wt 157.0 lb

## 2013-05-07 DIAGNOSIS — Z30430 Encounter for insertion of intrauterine contraceptive device: Secondary | ICD-10-CM

## 2013-05-07 DIAGNOSIS — E0590 Thyrotoxicosis, unspecified without thyrotoxic crisis or storm: Secondary | ICD-10-CM

## 2013-05-07 DIAGNOSIS — Z3202 Encounter for pregnancy test, result negative: Secondary | ICD-10-CM

## 2013-05-07 DIAGNOSIS — O99345 Other mental disorders complicating the puerperium: Secondary | ICD-10-CM

## 2013-05-07 DIAGNOSIS — F418 Other specified anxiety disorders: Secondary | ICD-10-CM

## 2013-05-07 DIAGNOSIS — IMO0001 Reserved for inherently not codable concepts without codable children: Secondary | ICD-10-CM

## 2013-05-07 LAB — GC/CHLAMYDIA PROBE AMP: CT Probe RNA: NEGATIVE

## 2013-05-07 NOTE — Progress Notes (Signed)
IUD Insertion Procedure Note  Pre-operative Diagnosis: Postpartum  Post-operative Diagnosis: normal  Indications: contraception  Procedure Details  Urine pregnancy test was done and result was negative.  The risks (including infection, bleeding, pain, and uterine perforation) and benefits of the procedure were explained to the patient and Written informed consent was obtained.    Cervix cleansed with Betadine. Uterus sounded to 7.5 cm. IUD inserted without difficulty. String visible and trimmed. Patient tolerated procedure well.  IUD Information: Mirena, Lot # [id number], Expiration date 08/2015.  Condition: Stable  Complications: None  Plan:  The patient was advised to call for any fever or for prolonged or severe pain or bleeding. She was advised to use OTC acetaminophen, OTC ibuprofen and heating pad as needed for mild to moderate pain.   Attending Physician Documentation: I was present for or participated in the entire procedure, including opening and closing.  Chaela Branscum Wilson Singer CNM

## 2013-05-15 ENCOUNTER — Encounter: Payer: Self-pay | Admitting: Obstetrics & Gynecology

## 2013-05-15 LAB — US OB DETAIL + 14 WK

## 2013-05-20 ENCOUNTER — Other Ambulatory Visit: Payer: Self-pay | Admitting: *Deleted

## 2013-05-20 DIAGNOSIS — B379 Candidiasis, unspecified: Secondary | ICD-10-CM

## 2013-05-20 MED ORDER — FLUCONAZOLE 150 MG PO TABS: 150.0000 mg | ORAL_TABLET | Freq: Once | ORAL | Status: DC

## 2013-05-28 ENCOUNTER — Encounter: Payer: Self-pay | Admitting: Obstetrics & Gynecology

## 2013-07-01 ENCOUNTER — Encounter: Payer: Self-pay | Admitting: Advanced Practice Midwife

## 2013-07-01 ENCOUNTER — Ambulatory Visit (INDEPENDENT_AMBULATORY_CARE_PROVIDER_SITE_OTHER): Payer: BC Managed Care – PPO | Admitting: Advanced Practice Midwife

## 2013-07-01 VITALS — BP 133/87 | HR 109 | Temp 98.9°F | Ht 62.0 in | Wt 160.0 lb

## 2013-07-01 DIAGNOSIS — R232 Flushing: Secondary | ICD-10-CM

## 2013-07-01 DIAGNOSIS — F411 Generalized anxiety disorder: Secondary | ICD-10-CM

## 2013-07-01 DIAGNOSIS — L680 Hirsutism: Secondary | ICD-10-CM

## 2013-07-01 DIAGNOSIS — N926 Irregular menstruation, unspecified: Secondary | ICD-10-CM

## 2013-07-01 DIAGNOSIS — N939 Abnormal uterine and vaginal bleeding, unspecified: Secondary | ICD-10-CM

## 2013-07-01 DIAGNOSIS — F419 Anxiety disorder, unspecified: Secondary | ICD-10-CM

## 2013-07-01 DIAGNOSIS — N951 Menopausal and female climacteric states: Secondary | ICD-10-CM

## 2013-07-01 LAB — HEMOGLOBIN A1C
Hgb A1c MFr Bld: 5.4 % (ref <5.7)
Mean Plasma Glucose: 108 mg/dL (ref <117)

## 2013-07-01 MED ORDER — LEVONORGESTREL-ETHINYL ESTRAD 0.1-20 MG-MCG PO TABS: 1.0000 | ORAL_TABLET | Freq: Every day | ORAL | Status: DC

## 2013-07-01 NOTE — Progress Notes (Signed)
Patient in office today for a follow up of Mirena Insertion. Patient states she thinks she has an hormonal imbalance. Patient states she may have anxiety/depression. Patient states she cant stay focused, that she is always scattered brained. Patient states she doesn't feel like herself that she feels like a zombie. Patient states she is always tired and desire to do anything. Patient states she urgent care last week and her heart rate was 150 and doesn't know why.

## 2013-07-02 LAB — T3: T3 TOTAL: 97.9 ng/dL (ref 80.0–204.0)

## 2013-07-02 LAB — THYROID ANTIBODIES

## 2013-07-02 LAB — PROLACTIN: Prolactin: 5 ng/mL

## 2013-07-02 LAB — TSH: TSH: 0.51 u[IU]/mL (ref 0.350–4.500)

## 2013-07-02 LAB — T4, FREE: Free T4: 1.21 ng/dL (ref 0.80–1.80)

## 2013-07-02 NOTE — Progress Notes (Signed)
Subjective:     Tami Foster is a 33 y.o. woman who presents for continuing concerns postpartum. Patient reports continued anxiety, she is short with her family members. Has not yet made an appointment for psychiatric services. Reports taking 75 of Zoloft a day and has not yet increased to 100mg . Reports mild depression, denies suicidal ideations. She reports her son is mostly sleeping through the night, she gets sleep but does not feel rested and she is not sleeping well. She has hot flashes with sweating all day and night, reports rapid heart rate. States she has excessive facial hair and acne. Continues to smoke although she would like to stop. States some days she doesn't smoke at all and some days she chain smokes. She is overall happy with the IUD and not ready to remove it today however she wonders if it is attributed to any of her concerns. She is having irregular spotting and bleeding w/ some abnormal discharge. Denies vaginal itching, burning or pain. Denies dysmenorrhea. Patient's last menstrual period was 06/12/2013. Menarche age: [age]. Periods are irregular, lasting 3 days. Dysmenorrhea:none. Cyclic symptoms include: none. Current contraception: IUD.History of infertility: yes - patient mentioned her first conception was her son although in years past she had unprotected intercourse and never got pregnant. Reports she has been told in the past she may have PCOS. Reports that she had problems w/ facial and chest hair before the pregnancy.   The following portions of the patient's history were reviewed and updated as appropriate: allergies, current medications, past family history, past medical history, past social history, past surgical history and problem list.  Review of Systems A comprehensive review of systems was negative except for: Constitutional: positive for fatigue, night sweats, sweats and unable to lose weight Respiratory: positive for negative Cardiovascular: positive for  tachycardia Neurological: positive for fatigue, anxiety, short tempered Behavioral/Psych: positive for mood swings     Objective:    BP 133/87  Pulse 109  Temp(Src) 98.9 F (37.2 C)  Ht 5\' 2"  (1.575 m)  Wt 160 lb (72.576 kg)  BMI 29.26 kg/m2  LMP 06/12/2013  Breastfeeding? No General appearance: alert, cooperative and moderate distress Abdomen: soft, non-tender; bowel sounds normal; no masses,  no organomegaly Pelvic: cervix normal in appearance, external genitalia normal, no adnexal masses or tenderness, no cervical motion tenderness, rectovaginal septum normal, uterus normal size, shape, and consistency, vagina normal without discharge and IUD strings present approx 3 cm Neurologic: Coordination: normal     Assessment:    The patient has abnormal uterine spotting secondary to IUD.   Postpartum Depression and Anxiety Hirsutism Fatigue Unable to rule out PCOS and thyroid abnormality Smoker, desires to quit, unmotivated to stop  Plan:    Diagnosis explained in detail. All questions answered. Agricultural engineerducational material distributed. Blood tests: Free T4 level, Prolactin hormone level, TSH and T3, HgA1C. Contraception: IUD. Follow up in 1 month.  Referral made again for psychiatric services. Patient interested in wellbutrin for smoking cessation however she is currently taking Zoloft. I recommend she increase zoloft to 100mg  daily. I recommend she have consult w/ psychiatric services and we will wait to decide on a change in medication until after their visit. I think wellbutrin is a good option for her pending her consultation visit. Encouraged smoking cessation. Rx for 1 month of Alesse for relief of hirsutism and abnormal uterine bleeding. Discussed risk assoc in taking OCP while smoking, patient aware and desires to try medication. If patient happy with treatment consider refill  and possibly d/c IUD if patient agrees to smoking cessation plan and remains normotensive on  medication. TSH evaluation again today due to symptoms. Consider referral to endocrine if abnormal. RTC in 1 month or PRN  40 min spent with patient greater than 80% spent in counseling and coordination of care.   Deshae Dickison Wilson Singer CNM

## 2013-07-30 ENCOUNTER — Ambulatory Visit (INDEPENDENT_AMBULATORY_CARE_PROVIDER_SITE_OTHER): Payer: BC Managed Care – PPO | Admitting: Advanced Practice Midwife

## 2013-07-30 ENCOUNTER — Encounter: Payer: Self-pay | Admitting: Advanced Practice Midwife

## 2013-07-30 VITALS — BP 129/79 | HR 97 | Temp 98.7°F | Ht 62.0 in | Wt 161.0 lb

## 2013-07-30 DIAGNOSIS — F3289 Other specified depressive episodes: Secondary | ICD-10-CM

## 2013-07-30 DIAGNOSIS — O99345 Other mental disorders complicating the puerperium: Secondary | ICD-10-CM

## 2013-07-30 DIAGNOSIS — G479 Sleep disorder, unspecified: Secondary | ICD-10-CM

## 2013-07-30 DIAGNOSIS — L659 Nonscarring hair loss, unspecified: Secondary | ICD-10-CM

## 2013-07-30 DIAGNOSIS — F53 Postpartum depression: Secondary | ICD-10-CM

## 2013-07-30 DIAGNOSIS — F411 Generalized anxiety disorder: Secondary | ICD-10-CM

## 2013-07-30 DIAGNOSIS — F419 Anxiety disorder, unspecified: Secondary | ICD-10-CM

## 2013-07-30 DIAGNOSIS — F329 Major depressive disorder, single episode, unspecified: Secondary | ICD-10-CM

## 2013-07-30 NOTE — Progress Notes (Signed)
Subjective:     Tami Foster is a 33 y.o. female who presents for a postpartum visit. She is 5  Months  postpartum following a spontaneous vaginal delivery. I have fully reviewed the prenatal and intrapartum course. The delivery was at 34 gestational weeks. Outcome: spontaneous vaginal delivery. Anesthesia: epidural. Postpartum course has been WNL. Baby's course has been WNL. Baby is feeding by bottle Rush Barer- Gerber Goodstart soothe . Bleeding no bleeding. Bowel function is normal. Bladder function is normal. Patient is sexually active. Contraception method is IUD. Postpartum depression screening: positive. ( Signs of Severe Depression)  Patient states she always has a stringy red discharge. Patient states symptoms have not changed since last visit. Patient states she likes the idea of having the IUD but that if the symptoms continue she would probably want to remove it. Patient states she is taking her Zoloft but not the way she is suppose to.   Patient reports anxiety, night and day sweats, hair loss. Reports difficulty falling and staying asleep. Her son is sleeping through the night. Reports her legs do always feel restless. Sleeping or awake.   The following portions of the patient's history were reviewed and updated as appropriate: allergies, current medications, past family history, past medical history, past social history, past surgical history and problem list.  Review of Systems Constitutional: positive for night sweats, sweats and weight gain Neurological: positive for fatigue, anxiety Behavioral/Psych: positive for anxiety, bad mood, depression, irritability, sleep disturbance and tobacco use Endocrine: negative   Objective:    BP 129/79  Pulse 97  Temp(Src) 98.7 F (37.1 C)  Ht 5\' 2"  (1.575 m)  Wt 161 lb (73.029 kg)  BMI 29.44 kg/m2  LMP 07/10/2013  Breastfeeding? No  General:  alert and cooperative       Assessment:   5 months PP Postpartum depression, not well controlled  on zoloft Bottle feeding Mirena IUD, w/ continued abnormal vaginal bleeding, scant Fatigue, poor sleep, potential RLS Night and Day Sweats, Anxiety and hair loss  Plan:    1. Contraception: IUD 2. Night and day sweats, fatigue and hair loss could all be normal in the PP time period. Reviewed + sleep hygiene. Gave patient an RLS sheet to review the symptoms. If patient would like we could try to treat RLS to improve sleep and hopefully symptoms. Encouraged smoking cessation. Patient unaware of snoring while sleeping, cont to evaluate. 3. Previous TSH panel WNL, Hgb A1C WNL, CBC WNL  4. Follow up in: PRN  5. Patient will call if she decides to have IUD removed if AUB does not stop.  6. Patient to cont care w/ Psych services. Consider change in treatment based on their recommendations.  7. Consider future treatment of RLS.  20 min spent with patient greater than 80% spent in counseling and coordination of care.   Efstathios Sawin Wilson SingerWren CNM

## 2013-08-02 ENCOUNTER — Telehealth: Payer: Self-pay | Admitting: Advanced Practice Midwife

## 2013-08-02 NOTE — Telephone Encounter (Signed)
Patient called. I have tried many times to return phone call and was unable. I spoke with her counselor. We prefer she see the nurse practitioner at their clinic on Tuesday to get their recommendation for changing antidepressant.   I will also speak in more detail w/ her about sleep, once I am able to talk on the phone with her.  Will Heinkel Wilson SingerWren CNM

## 2014-02-21 ENCOUNTER — Ambulatory Visit (INDEPENDENT_AMBULATORY_CARE_PROVIDER_SITE_OTHER): Payer: BC Managed Care – PPO | Admitting: Family Medicine

## 2014-02-21 VITALS — BP 128/74 | HR 100 | Temp 98.3°F | Resp 16 | Ht 63.5 in | Wt 166.0 lb

## 2014-02-21 DIAGNOSIS — M25571 Pain in right ankle and joints of right foot: Secondary | ICD-10-CM

## 2014-02-21 DIAGNOSIS — F32A Depression, unspecified: Secondary | ICD-10-CM

## 2014-02-21 DIAGNOSIS — M25572 Pain in left ankle and joints of left foot: Secondary | ICD-10-CM

## 2014-02-21 DIAGNOSIS — F329 Major depressive disorder, single episode, unspecified: Secondary | ICD-10-CM

## 2014-02-21 DIAGNOSIS — M546 Pain in thoracic spine: Secondary | ICD-10-CM

## 2014-02-21 MED ORDER — PAROXETINE HCL 10 MG PO TABS: 10.0000 mg | ORAL_TABLET | Freq: Every day | ORAL | Status: DC

## 2014-02-21 MED ORDER — MELOXICAM 15 MG PO TABS: 15.0000 mg | ORAL_TABLET | Freq: Every day | ORAL | Status: DC

## 2014-02-21 MED ORDER — CYCLOBENZAPRINE HCL 10 MG PO TABS: ORAL_TABLET | ORAL | Status: DC

## 2014-02-21 NOTE — Progress Notes (Signed)
Subjective:    Patient ID: Tami SartoriusNatalie Foster, female    DOB: Jan 16, 1981, 33 y.o.   MRN: [id number]  HPI This is a very pleasant 33yo female who is accompanied by her nearly 671 yo son. The patient has had a 6 month history of intermittent, mid back and bilateral ankle pain. She can not recall any injury to her back or particular strain although lifting her son exacerbates her pain. The pain is worse with sitting and standing and occasionally radiates down left buttock. She feels throbbing in legs when lying down. She has more headaches recently, usually starts behind one of her eyes. Sometimes has nausea. She is not sure if her headaches correlate with her back pain. She has had ankle/foot pain off and on for the last couple of months. Pain with walking, some swelling. Uses ice with some relief. Pain on lateral ankles after being up on feel a lot; pain across bottom in the mornings.   She has a history of depression and post partum depression. She was given zoloft which she discontinued. She reports that her depression is definitely better but she continues to feel depressed and anxious. She has difficulty getting to sleep and staying asleep, waking hourly. Her son typically sleeps through the night. She takes care of 2 additional children during the day and had 2 stepdaughters that live with her and her husband.   Has off and on constipation. Has used miralax without success. Uses epson salts and water with good results. Admits that her diet is not great. Eats fast food and drinks soda regularly. Would like to lose 20 pounds. TSH and HbA1C normal 5/15.   Review of Systems No urinary frequency/hematuria/dysuria.    Objective:   Physical Exam  Vitals reviewed. Constitutional: She is oriented to person, place, and time. She appears well-developed and well-nourished.  HENT:  Head: Normocephalic and atraumatic.  Right Ear: External ear normal.  Left Ear: External ear normal.  Nose: Nose normal.    Mouth/Throat: Oropharynx is clear and moist. No oropharyngeal exudate.  Eyes: Conjunctivae and EOM are normal. Pupils are equal, round, and reactive to light. Right eye exhibits no discharge. Left eye exhibits no discharge.  Neck: Normal range of motion. Neck supple. No thyromegaly present.  Cardiovascular: Normal rate, regular rhythm and normal heart sounds.   Pulmonary/Chest: Effort normal and breath sounds normal.  Abdominal: Soft. Bowel sounds are normal. She exhibits no distension and no mass. There is tenderness in the left upper quadrant. There is no rebound and no guarding.  Mild LUQ tenderness to deep palpation.  Musculoskeletal: Normal range of motion. She exhibits no edema.       Cervical back: Normal.       Thoracic back: She exhibits tenderness. She exhibits normal range of motion, no bony tenderness, no swelling and no edema.       Lumbar back: She exhibits tenderness. She exhibits normal range of motion, no bony tenderness, no swelling and no edema.  Some generalized paraspinal tenderness mid thoracic and lumbar regions.  Lymphadenopathy:    She has no cervical adenopathy.  Neurological: She is alert and oriented to person, place, and time.  Skin: Skin is warm and dry.  Psychiatric: She has a normal mood and affect. Her behavior is normal. Judgment and thought content normal.      Assessment & Plan:  1. Depression - PARoxetine (PAXIL) 10 MG tablet; Take 1 tablet (10 mg total) by mouth daily.  Dispense: 30 tablet; Refill: 5 -  Encouraged increased exercise, improved sleep hygiene.   2. Midline thoracic back pain -Provided written and verbal information regarding diagnosis and treatment including Back Manual -Encouraged weight loss through healthy food choices, cutting out soda consumption, increasing activity - meloxicam (MOBIC) 15 MG tablet; Take 1 tablet (15 mg total) by mouth daily.  Dispense: 30 tablet; Refill: 1 - cyclobenzaprine (FLEXERIL) 10 MG tablet; Take 1/2 to 1  tablet qhs prn  Dispense: 30 tablet; Refill: 0  3. Bilateral ankle pain -Provided instructions for stretching -This likely to improve with weight loss as well.  -Follow up in 6-8 weeks, sooner if worsening symptoms.   Emi Belfasteborah B. Lisette Mancebo, FNP-BC  [facility], [facility]  02/21/2014 4:05 PM

## 2014-02-21 NOTE — Patient Instructions (Signed)
Plantar Fasciitis  Plantar fasciitis is a common condition that causes foot pain. It is soreness (inflammation) of the band of tough fibrous tissue on the bottom of the foot that runs from the heel bone (calcaneus) to the ball of the foot. The cause of this soreness may be from excessive standing, poor fitting shoes, running on hard surfaces, being overweight, having an abnormal walk, or overuse (this is common in runners) of the painful foot or feet. It is also common in aerobic exercise dancers and ballet dancers.  SYMPTOMS   Most people with plantar fasciitis complain of:   Severe pain in the morning on the bottom of their foot especially when taking the first steps out of bed. This pain recedes after a few minutes of walking.   Severe pain is experienced also during walking following a long period of inactivity.   Pain is worse when walking barefoot or up stairs  DIAGNOSIS    Your caregiver will diagnose this condition by examining and feeling your foot.   Special tests such as X-rays of your foot, are usually not needed.  PREVENTION    Consult a sports medicine professional before beginning a new exercise program.   Walking programs offer a good workout. With walking there is a lower chance of overuse injuries common to runners. There is less impact and less jarring of the joints.   Begin all new exercise programs slowly. If problems or pain develop, decrease the amount of time or distance until you are at a comfortable level.   Wear good shoes and replace them regularly.   Stretch your foot and the heel cords at the back of the ankle (Achilles tendon) both before and after exercise.   Run or exercise on even surfaces that are not hard. For example, asphalt is better than pavement.   Do not run barefoot on hard surfaces.   If using a treadmill, vary the incline.   Do not continue to workout if you have foot or joint problems. Seek professional help if they do not improve.  HOME CARE INSTRUCTIONS     Avoid activities that cause you pain until you recover.   Use ice or cold packs on the problem or painful areas after working out.   Only take over-the-counter or prescription medicines for pain, discomfort, or fever as directed by your caregiver.   Soft shoe inserts or athletic shoes with air or gel sole cushions may be helpful.   If problems continue or become more severe, consult a sports medicine caregiver or your own health care provider. Cortisone is a potent anti-inflammatory medication that may be injected into the painful area. You can discuss this treatment with your caregiver.  MAKE SURE YOU:    Understand these instructions.   Will watch your condition.   Will get help right away if you are not doing well or get worse.  Document Released: 01/18/2001 Document Revised: 07/18/2011 Document Reviewed: 03/19/2008  ExitCare Patient Information 2015 [employer]. This information is not intended to replace advice given to you by your health care provider. Make sure you discuss any questions you have with your health care provider.

## 2014-03-24 ENCOUNTER — Ambulatory Visit (INDEPENDENT_AMBULATORY_CARE_PROVIDER_SITE_OTHER): Payer: BC Managed Care – PPO | Admitting: Family Medicine

## 2014-03-24 ENCOUNTER — Encounter: Payer: Self-pay | Admitting: Family Medicine

## 2014-03-24 VITALS — BP 134/90 | HR 130 | Temp 98.4°F | Resp 16 | Ht 62.5 in | Wt 163.2 lb

## 2014-03-24 DIAGNOSIS — F32A Depression, unspecified: Secondary | ICD-10-CM

## 2014-03-24 DIAGNOSIS — B9689 Other specified bacterial agents as the cause of diseases classified elsewhere: Secondary | ICD-10-CM

## 2014-03-24 DIAGNOSIS — M25572 Pain in left ankle and joints of left foot: Secondary | ICD-10-CM

## 2014-03-24 DIAGNOSIS — M546 Pain in thoracic spine: Secondary | ICD-10-CM

## 2014-03-24 DIAGNOSIS — M25571 Pain in right ankle and joints of right foot: Secondary | ICD-10-CM

## 2014-03-24 DIAGNOSIS — N898 Other specified noninflammatory disorders of vagina: Secondary | ICD-10-CM

## 2014-03-24 DIAGNOSIS — F329 Major depressive disorder, single episode, unspecified: Secondary | ICD-10-CM

## 2014-03-24 DIAGNOSIS — N760 Acute vaginitis: Secondary | ICD-10-CM

## 2014-03-24 DIAGNOSIS — A499 Bacterial infection, unspecified: Secondary | ICD-10-CM

## 2014-03-24 DIAGNOSIS — R5382 Chronic fatigue, unspecified: Secondary | ICD-10-CM

## 2014-03-24 LAB — POCT WET PREP WITH KOH
KOH PREP POC: NEGATIVE
Trichomonas, UA: NEGATIVE
YEAST WET PREP PER HPF POC: NEGATIVE

## 2014-03-24 LAB — TSH: TSH: 0.522 u[IU]/mL (ref 0.350–4.500)

## 2014-03-24 MED ORDER — NAPROXEN SODIUM 550 MG PO TABS: 550.0000 mg | ORAL_TABLET | Freq: Two times a day (BID) | ORAL | Status: DC

## 2014-03-24 MED ORDER — METRONIDAZOLE 500 MG PO TABS: 500.0000 mg | ORAL_TABLET | Freq: Two times a day (BID) | ORAL | Status: DC

## 2014-03-24 NOTE — Progress Notes (Signed)
Subjective:    Patient ID: Tami SartoriusNatalie Foster, female    DOB: 04/12/1981, 33 y.o.   MRN: [id number]  HPI Patient presents today for follow up of back pain, ankle pain and depression.   She reports that her back pain and ankle pain aren't much better. She had a back spasm last week while changing her son's diaper. She had radiation of pain down left leg. Has had 2 of these episodes in the last couple of months. Eventually, the pain went away with flexeril. She has pain with sitting and difficulty getting comfortable at night. She has not had weakness. Her ankles hurt on lateral aspect. Her floors at home are hardwood over concrete and she thinks this may be the source of her ankle pain. The pain is worse in the morning, better after walking. She has not been stretching prior to getting out of bed.   She has had chronic problems with sleep, having difficulty falling asleep and staying asleep. She goes to bed around 11 and gets up around 6. She does not feel rested. She avoids caffeine after lunch. She has tried melatonin without relief. She is not interested in anything for sleep at this point. The flexeril does help a little with sleep.   The patient was seen last month and given meloxicam/flexeril, back exercise and foot exercises. She reports that she has not been very compliant with exercises or body mechanic improvement. She takes care of 5 children, 3 of which are under 325 year old and she is lifting and bending frequently.   She has noticed an improvement in her mood with the Paxil. She has not had any side effects. She has been eating better and has lost 3 pounds in 1 month.   She thinks a lot of her problems may be related to her Mirena IUD. She noticed that her pain seemed to start after her IUD was placed 11 months ago. She has had a constant discharge since placement. Occasional odor, no itching.   Review of Systems No fever, no chills, no falls.     Objective:   Physical Exam    Constitutional: She is oriented to person, place, and time. She appears well-developed and well-nourished.  HENT:  Head: Normocephalic and atraumatic.  Eyes: Conjunctivae are normal.  Neck: Normal range of motion. Neck supple.  Cardiovascular: Normal rate.   Recheck of heart rate- 100 bpm.  Pulmonary/Chest: Effort normal.  Musculoskeletal: Normal range of motion.  Neurological: She is alert and oriented to person, place, and time.  Skin: Skin is warm and dry.  Psychiatric: She has a normal mood and affect. Her behavior is normal. Judgment and thought content normal.  Vitals reviewed.  BP 134/90 mmHg  Pulse 130  Temp(Src) 98.4 F (36.9 C) (Oral)  Resp 16  Ht 5' 2.5" (1.588 m)  Wt 163 lb 3.2 oz (74.027 kg)  BMI 29.36 kg/m2  SpO2 97%   POCT Wet Prep with KOH  Result Value Ref Range   Trichomonas, UA Negative    Clue Cells Wet Prep HPF POC tntc    Epithelial Wet Prep HPF POC tntc    Yeast Wet Prep HPF POC neg    Bacteria Wet Prep HPF POC 1+    RBC Wet Prep HPF POC 2-3    WBC Wet Prep HPF POC 6-8    KOH Prep POC Negative       Assessment & Plan:  1. Depression -She has seen improvement on Paxil. Will continue current  dose and increase at next visit if needed.   2. Midline thoracic back pain -She has seen little improvement with Meloxicam. Her exam is unremarkable. She has not been able to stretch/exercise/work on posture as advised previously. Offered her PT referral, but patient doesn't have time or financial resources. - Encouraged stretching, heat, good body mechanics - naproxen sodium (ANAPROX DS) 550 MG tablet; Take 1 tablet (550 mg total) by mouth 2 (two) times daily with a meal. Take instead of meloxicam.  Dispense: 60 tablet; Refill: 1 - Sedimentation Rate  3. Bilateral ankle pain -She thinks this may be related to her concrete sub floors- encouraged her to wear supportive shoes at all times while awake, to stretch prior to getting out of bed in the morning.  -  naproxen sodium (ANAPROX DS) 550 MG tablet; Take 1 tablet (550 mg total) by mouth 2 (two) times daily with a meal. Take instead of meloxicam.  Dispense: 60 tablet; Refill: 1 - Sedimentation Rate  4. Chronic fatigue - it is difficult to know how much her depression and poor sleep factor into this. Encouraged good sleep hygiene and discussed OTC/RX sleep aids. Patient does not want to take anything that might make her sleep so soundly that she doesn't hear her infant son cry.  - CBC - TSH - Sedimentation Rate  5. Vaginal discharge - POCT Wet Prep with KOH - Discussed problems she thinks might be associated with her IUD and alternate forms of contraception. She will think about this and make an appointment with her gyn if she decides to have her IUD removed.   6. BV (bacterial vaginosis) - metroNIDAZOLE (FLAGYL) 500 MG tablet; Take 1 tablet (500 mg total) by mouth 2 (two) times daily with a meal. DO NOT CONSUME ALCOHOL WHILE TAKING THIS MEDICATION.  Dispense: 14 tablet; Refill: 0  Follow up in 2 months, sooner if needed.  Emi Belfasteborah B. Iniko Robles, FNP-BC  [facility], [facility]  03/25/2014 8:07 PM

## 2014-03-24 NOTE — Patient Instructions (Signed)
For constipation- can try stool softener- colace daily, laxative- dulcolax  Insomnia Insomnia is frequent trouble falling and/or staying asleep. Insomnia can be a long term problem or a short term problem. Both are common. Insomnia can be a short term problem when the wakefulness is related to a certain stress or worry. Long term insomnia is often related to ongoing stress during waking hours and/or poor sleeping habits. Overtime, sleep deprivation itself can make the problem worse. Every little thing feels more severe because you are overtired and your ability to cope is decreased. CAUSES   Stress, anxiety, and depression.  Poor sleeping habits.  Distractions such as TV in the bedroom.  Naps close to bedtime.  Engaging in emotionally charged conversations before bed.  Technical reading before sleep.  Alcohol and other sedatives. They may make the problem worse. They can hurt normal sleep patterns and normal dream activity.  Stimulants such as caffeine for several hours prior to bedtime.  Pain syndromes and shortness of breath can cause insomnia.  Exercise late at night.  Changing time zones may cause sleeping problems (jet lag). It is sometimes helpful to have someone observe your sleeping patterns. They should look for periods of not breathing during the night (sleep apnea). They should also look to see how long those periods last. If you live alone or observers are uncertain, you can also be observed at a sleep clinic where your sleep patterns will be professionally monitored. Sleep apnea requires a checkup and treatment. Give your caregivers your medical history. Give your caregivers observations your family has made about your sleep.  SYMPTOMS   Not feeling rested in the morning.  Anxiety and restlessness at bedtime.  Difficulty falling and staying asleep. TREATMENT   Your caregiver may prescribe treatment for an underlying medical disorders. Your caregiver can give advice  or help if you are using alcohol or other drugs for self-medication. Treatment of underlying problems will usually eliminate insomnia problems.  Medications can be prescribed for short time use. They are generally not recommended for lengthy use.  Over-the-counter sleep medicines are not recommended for lengthy use. They can be habit forming.  You can promote easier sleeping by making lifestyle changes such as:  Using relaxation techniques that help with breathing and reduce muscle tension.  Exercising earlier in the day.  Changing your diet and the time of your last meal. No night time snacks.  Establish a regular time to go to bed.  Counseling can help with stressful problems and worry.  Soothing music and white noise may be helpful if there are background noises you cannot remove.  Stop tedious detailed work at least one hour before bedtime. HOME CARE INSTRUCTIONS   Keep a diary. Inform your caregiver about your progress. This includes any medication side effects. See your caregiver regularly. Take note of:  Times when you are asleep.  Times when you are awake during the night.  The quality of your sleep.  How you feel the next day. This information will help your caregiver care for you.  Get out of bed if you are still awake after 15 minutes. Read or do some quiet activity. Keep the lights down. Wait until you feel sleepy and go back to bed.  Keep regular sleeping and waking hours. Avoid naps.  Exercise regularly.  Avoid distractions at bedtime. Distractions include watching television or engaging in any intense or detailed activity like attempting to balance the household checkbook.  Develop a bedtime ritual. Keep a familiar routine  of bathing, brushing your teeth, climbing into bed at the same time each night, listening to soothing music. Routines increase the success of falling to sleep faster.  Use relaxation techniques. This can be using breathing and muscle  tension release routines. It can also include visualizing peaceful scenes. You can also help control troubling or intruding thoughts by keeping your mind occupied with boring or repetitive thoughts like the old concept of counting sheep. You can make it more creative like imagining planting one beautiful flower after another in your backyard garden.  During your day, work to eliminate stress. When this is not possible use some of the previous suggestions to help reduce the anxiety that accompanies stressful situations. MAKE SURE YOU:   Understand these instructions.  Will watch your condition.  Will get help right away if you are not doing well or get worse. Document Released: 04/22/2000 Document Revised: 07/18/2011 Document Reviewed: 05/23/2007 [facility] Patient Information 2015 [place], [place]. This information is not intended to replace advice given to you by your health care provider. Make sure you discuss any questions you have with your health care provider. Bacterial Vaginosis Bacterial vaginosis is a vaginal infection that occurs when the normal balance of bacteria in the vagina is disrupted. It results from an overgrowth of certain bacteria. This is the most common vaginal infection in women of childbearing age. Treatment is important to prevent complications, especially in pregnant women, as it can cause a premature delivery. CAUSES  Bacterial vaginosis is caused by an increase in harmful bacteria that are normally present in smaller amounts in the vagina. Several different kinds of bacteria can cause bacterial vaginosis. However, the reason that the condition develops is not fully understood. RISK FACTORS Certain activities or behaviors can put you at an increased risk of developing bacterial vaginosis, including:  Having a new sex partner or multiple sex partners.  Douching.  Using an intrauterine device (IUD) for contraception. Women do not get bacterial vaginosis from toilet  seats, bedding, swimming pools, or contact with objects around them. SIGNS AND SYMPTOMS  Some women with bacterial vaginosis have no signs or symptoms. Common symptoms include:  Grey vaginal discharge.  A fishlike odor with discharge, especially after sexual intercourse.  Itching or burning of the vagina and vulva.  Burning or pain with urination. DIAGNOSIS  Your health care provider will take a medical history and examine the vagina for signs of bacterial vaginosis. A sample of vaginal fluid may be taken. Your health care provider will look at this sample under a microscope to check for bacteria and abnormal cells. A vaginal pH test may also be done.  TREATMENT  Bacterial vaginosis may be treated with antibiotic medicines. These may be given in the form of a pill or a vaginal cream. A second round of antibiotics may be prescribed if the condition comes back after treatment.  HOME CARE INSTRUCTIONS   Only take over-the-counter or prescription medicines as directed by your health care provider.  If antibiotic medicine was prescribed, take it as directed. Make sure you finish it even if you start to feel better.  Do not have sex until treatment is completed.  Tell all sexual partners that you have a vaginal infection. They should see their health care provider and be treated if they have problems, such as a mild rash or itching.  Practice safe sex by using condoms and only having one sex partner. SEEK MEDICAL CARE IF:   Your symptoms are not improving after 3 days  of treatment.  You have increased discharge or pain.  You have a fever. MAKE SURE YOU:   Understand these instructions.  Will watch your condition.  Will get help right away if you are not doing well or get worse. FOR MORE INFORMATION  Centers for Disease Control and Prevention, Division of STD Prevention: SolutionApps.co.zawww.cdc.gov/std American Sexual Health Association (ASHA): www.ashastd.org  Document Released: 04/25/2005  Document Revised: 02/13/2013 Document Reviewed: 12/05/2012 [facility] Patient Information 2015 [place], [place]. This information is not intended to replace advice given to you by your health care provider. Make sure you discuss any questions you have with your health care provider.

## 2014-03-25 LAB — CBC
HCT: 44.6 % (ref 36.0–46.0)
Hemoglobin: 15.3 g/dL — ABNORMAL HIGH (ref 12.0–15.0)
MCH: 31.5 pg (ref 26.0–34.0)
MCHC: 34.3 g/dL (ref 30.0–36.0)
MCV: 92 fL (ref 78.0–100.0)
MPV: 10.7 fL (ref 9.4–12.4)
PLATELETS: 217 10*3/uL (ref 150–400)
RBC: 4.85 MIL/uL (ref 3.87–5.11)
RDW: 13.2 % (ref 11.5–15.5)
WBC: 8.9 10*3/uL (ref 4.0–10.5)

## 2014-03-25 LAB — SEDIMENTATION RATE: SED RATE: 1 mm/h (ref 0–22)

## 2014-05-05 ENCOUNTER — Encounter: Payer: Self-pay | Admitting: *Deleted

## 2014-05-30 IMAGING — US US OB TRANSVAGINAL
1 series · 13 of 27 positions shown · non-contrast
Comparison: none

[Series 1: us ob transvaginal · 27 acquisitions, 13 frames shown]
[im 2/27]
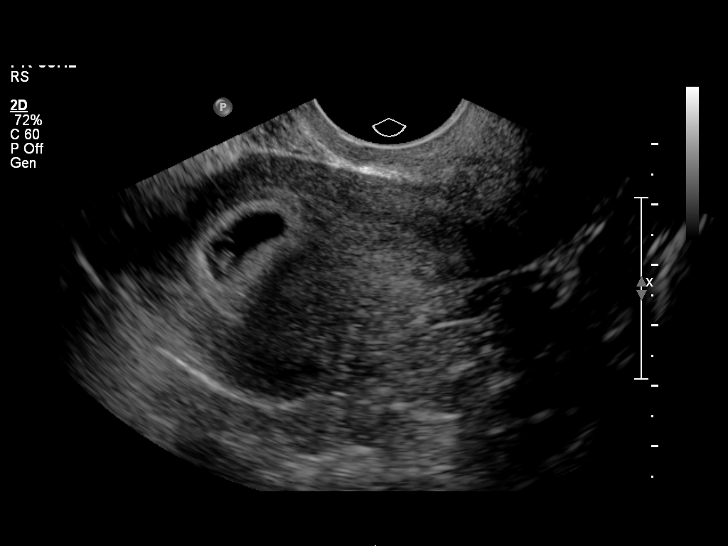
[im 4/27]
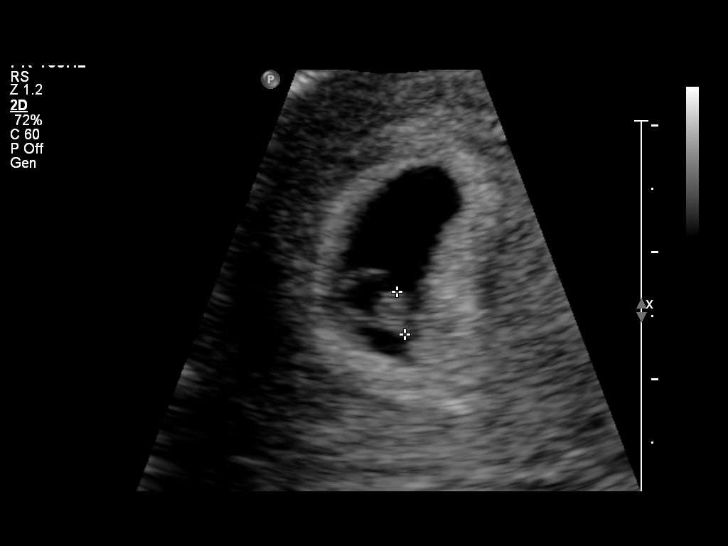
[im 6/27]
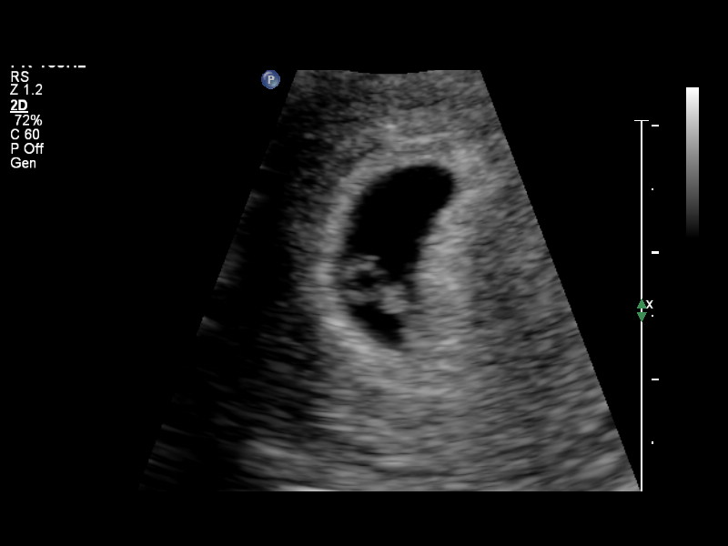
[im 8/27]
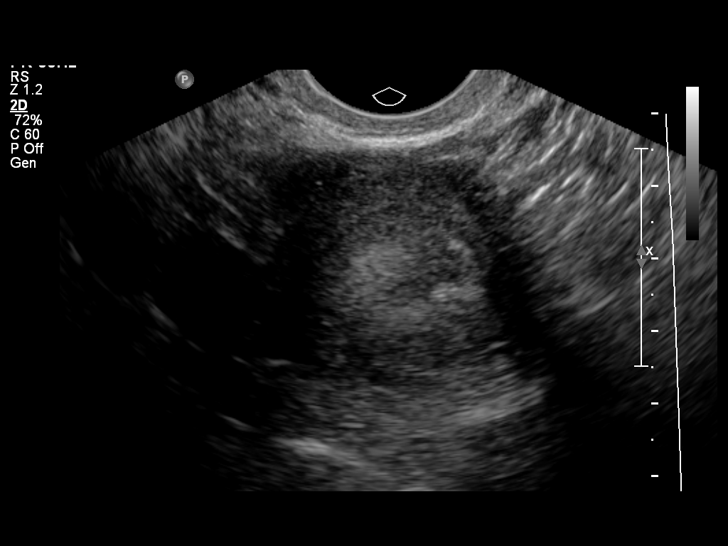
[im 10/27]
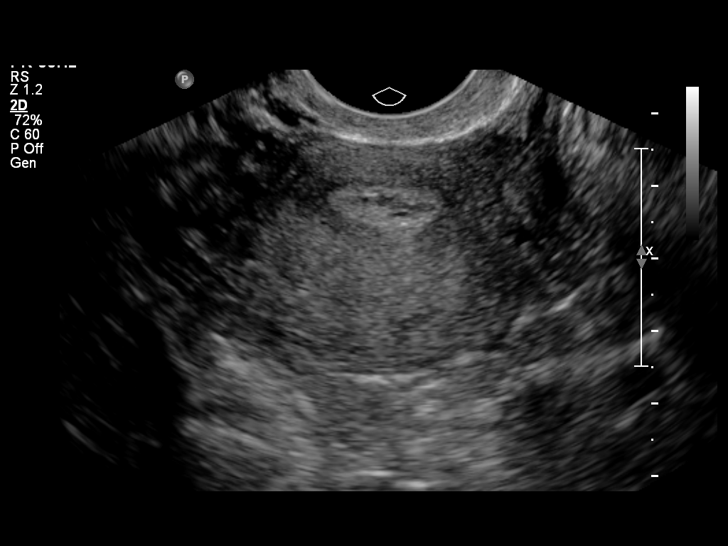
[im 12/27]
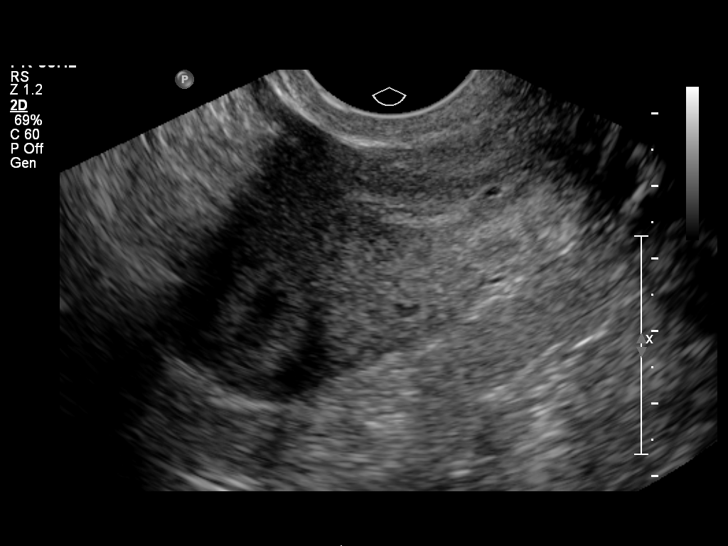
[im 14/27]
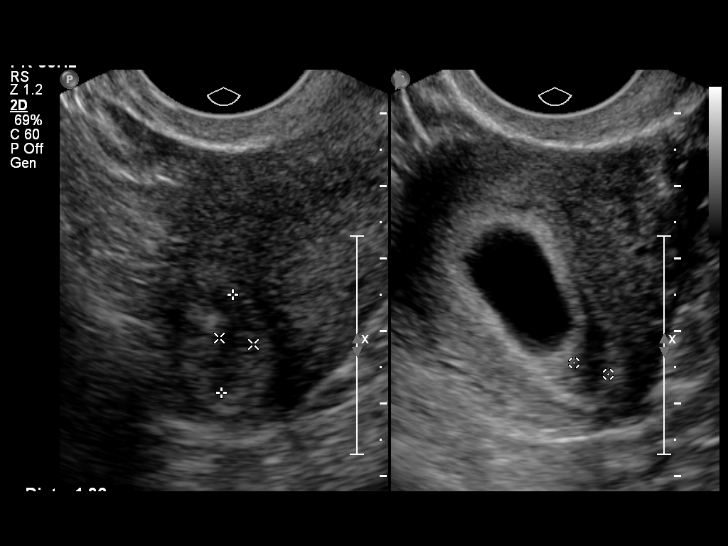
[im 16/27]
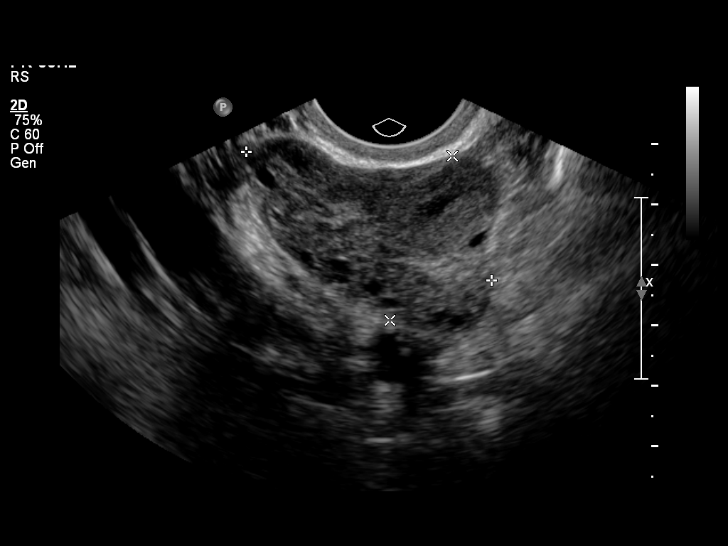
[im 18/27]
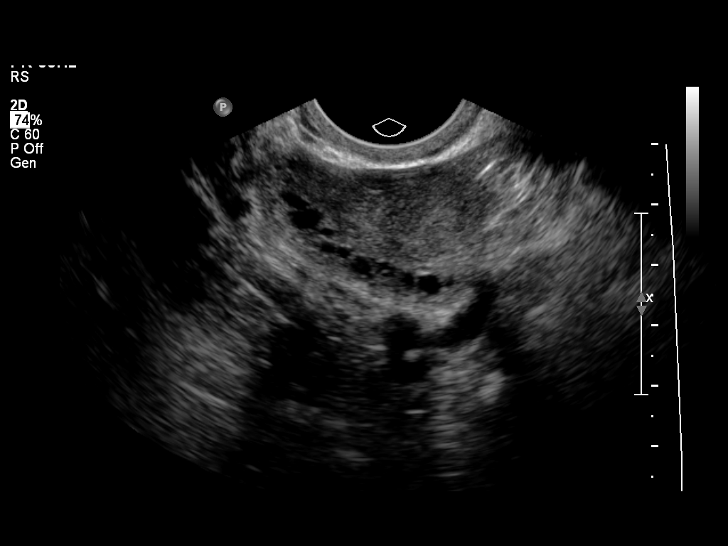
[im 20/27]
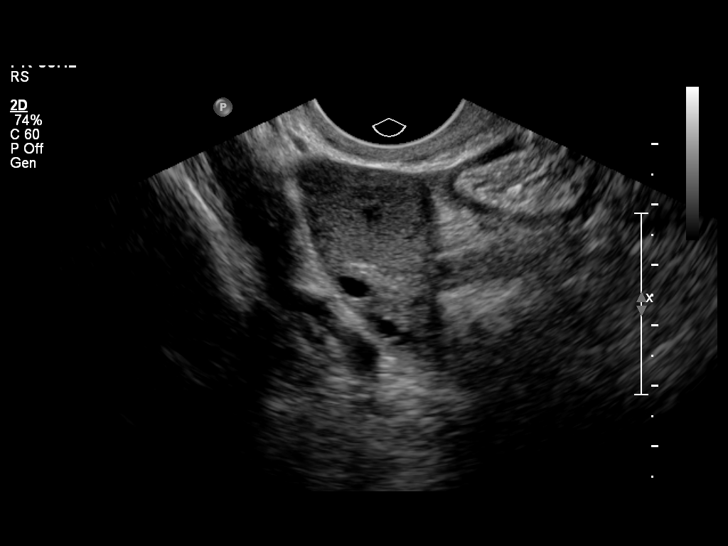
[im 22/27]
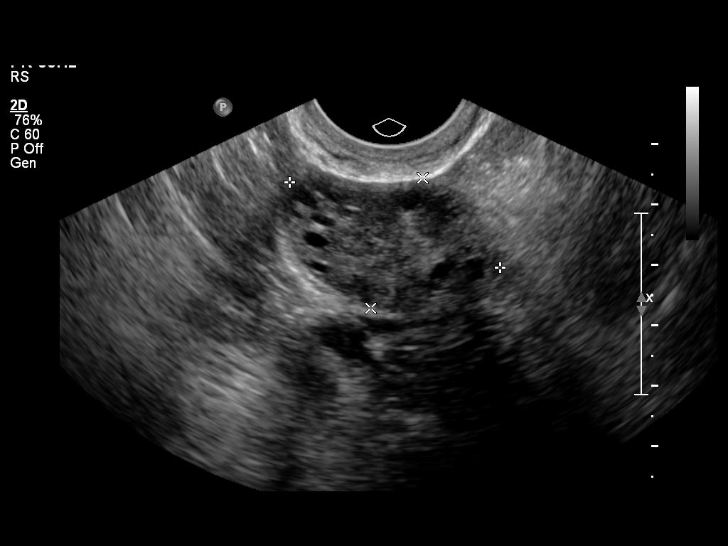
[im 24/27]
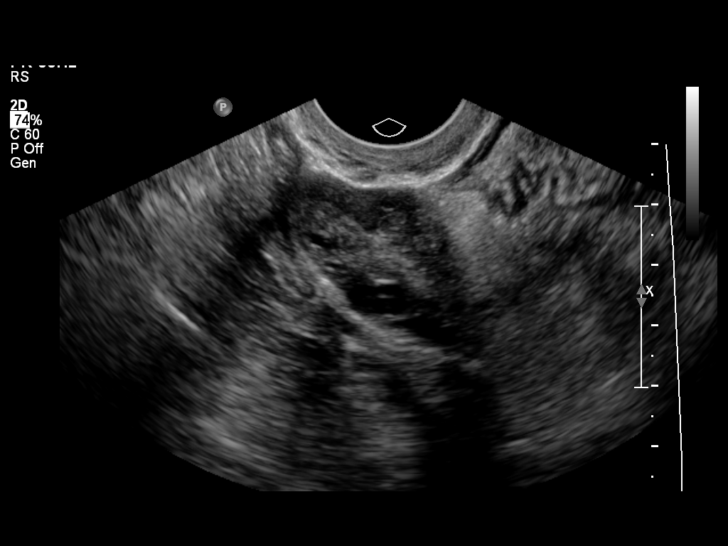
[im 26/27]
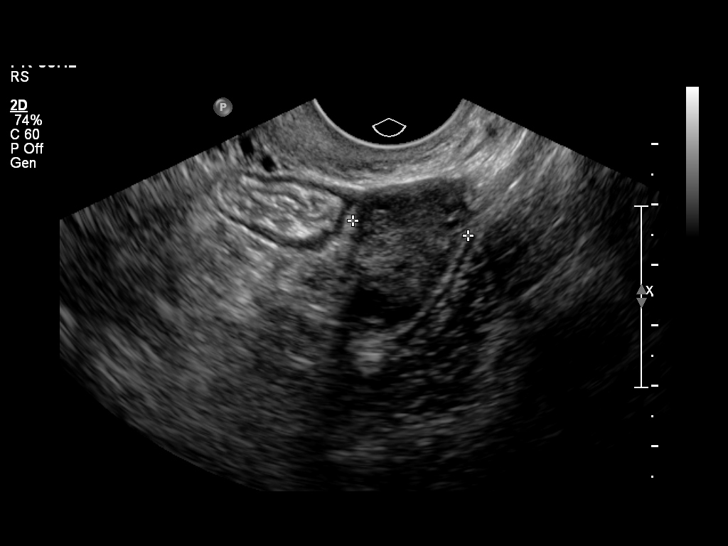

[13 of 27 positions shown; findings below may reference images not displayed]

OBSTETRICS REPORT
                      (Signed Final 08/15/2012 [DATE])

Service(s) Provided

 US OB TRANSVAGINAL                                    76817.0
Indications

 Vaginal bleeding, unknown etiology
 Pregnancy with inconclusive fetal viability
 Previous cervical surgery (LEEP 2993)
Fetal Evaluation

 Num Of Fetuses:    1
 Preg. Location:    Intrauterine
 Gest. Sac:         Intrauterine
 Yolk Sac:          Visualized
 Fetal Pole:        Visualized
 Fetal Heart Rate:  121                         bpm
 Cardiac Activity:  Observed

 Comment:    Small subchorionic hemorrhage noted measuring 1.4 x
             0.5 x 0.5cm.
Biometry

 CRL:      4.2  mm    G. Age:   6w 1d                  EDD:   04/09/13
Gestational Age

 Best:          6w 4d     Det. By:   Early Ultrasound         EDD:   04/06/13
Cervix Uterus Adnexa

 Cervix:       Normal appearance by transvaginal scan
 Uterus:       Normal shape and size.
 Cul De Sac:   No free fluid seen.

 Left Ovary:   Size(cm) L: 3.76 x W: 1.92 x H: 2.32  Volume(cc):
 Right Ovary:  Size(cm) L: 4.58 x W: 2.25 x H: 2.91  Volume(cc):
               15.7. Corpus luteum noted.
 Adnexa:     No abnormality visualized.
Impression

 Single living IUP with assigned GA of 6w 4d.
 Small subchorionic hemorrhage.
Recommendations

 US for fetal anatomic evaluation at 18-19 wks GA.

 questions or concerns.

## 2014-07-10 ENCOUNTER — Encounter: Payer: Self-pay | Admitting: Family Medicine

## 2014-07-11 ENCOUNTER — Ambulatory Visit (INDEPENDENT_AMBULATORY_CARE_PROVIDER_SITE_OTHER): Payer: BLUE CROSS/BLUE SHIELD | Admitting: Family Medicine

## 2014-07-11 VITALS — BP 126/82 | HR 116 | Temp 98.0°F | Resp 17 | Wt 167.0 lb

## 2014-07-11 DIAGNOSIS — Z20818 Contact with and (suspected) exposure to other bacterial communicable diseases: Secondary | ICD-10-CM

## 2014-07-11 DIAGNOSIS — R110 Nausea: Secondary | ICD-10-CM | POA: Diagnosis not present

## 2014-07-11 DIAGNOSIS — J029 Acute pharyngitis, unspecified: Secondary | ICD-10-CM

## 2014-07-11 DIAGNOSIS — Z2089 Contact with and (suspected) exposure to other communicable diseases: Secondary | ICD-10-CM | POA: Diagnosis not present

## 2014-07-11 DIAGNOSIS — J039 Acute tonsillitis, unspecified: Secondary | ICD-10-CM

## 2014-07-11 LAB — POCT RAPID STREP A (OFFICE): RAPID STREP A SCREEN: NEGATIVE

## 2014-07-11 MED ORDER — AMOXICILLIN 500 MG PO CAPS: 500.0000 mg | ORAL_CAPSULE | Freq: Three times a day (TID) | ORAL | Status: DC

## 2014-07-11 MED ORDER — ONDANSETRON 4 MG PO TBDP
4.0000 mg | ORAL_TABLET | Freq: Once | ORAL | Status: AC
  Administered 2014-07-11: 4 mg via ORAL

## 2014-07-11 NOTE — Addendum Note (Signed)
Addended byAlden Benjamin: Doni Bacha R on: 07/11/2014 04:08 PM   Modules accepted: Orders

## 2014-07-11 NOTE — Patient Instructions (Signed)
Start zofran (that you have at home) for nausea if needed. Start amoxicillin for possible strep throat. Return to the clinic or go to the nearest emergency room if any of your symptoms worsen or new symptoms occur, including any worsening abdominal pain or fevers.         Nausea, Adult Nausea is the feeling that you have an upset stomach or have to vomit. Nausea by itself is not likely a serious concern, but it may be an early sign of more serious medical problems. As nausea gets worse, it can lead to vomiting. If vomiting develops, there is the risk of dehydration.  CAUSES   Viral infections.  Food poisoning.  Medicines.  Pregnancy.  Motion sickness.  Migraine headaches.  Emotional distress.  Severe pain from any source.  Alcohol intoxication. HOME CARE INSTRUCTIONS  Get plenty of rest.  Ask your caregiver about specific rehydration instructions.  Eat small amounts of food and sip liquids more often.  Take all medicines as told by your caregiver. SEEK MEDICAL CARE IF:  You have not improved after 2 days, or you get worse.  You have a headache. SEEK IMMEDIATE MEDICAL CARE IF:   You have a fever.  You faint.  You keep vomiting or have blood in your vomit.  You are extremely weak or dehydrated.  You have dark or bloody stools.  You have severe chest or abdominal pain. MAKE SURE YOU:  Understand these instructions.  Will watch your condition.  Will get help right away if you are not doing well or get worse. Document Released: 06/02/2004 Document Revised: 01/18/2012 Document Reviewed: 01/05/2011 [facility] Patient Information 2015 [place], [place]. This information is not intended to replace advice given to you by your health care provider. Make sure you discuss any questions you have with your health care provider.     Pharyngitis Pharyngitis is redness, pain, and swelling (inflammation) of your pharynx.  CAUSES  Pharyngitis is usually caused by  infection. Most of the time, these infections are from viruses (viral) and are part of a cold. However, sometimes pharyngitis is caused by bacteria (bacterial). Pharyngitis can also be caused by allergies. Viral pharyngitis may be spread from person to person by coughing, sneezing, and personal items or utensils (cups, forks, spoons, toothbrushes). Bacterial pharyngitis may be spread from person to person by more intimate contact, such as kissing.  SIGNS AND SYMPTOMS  Symptoms of pharyngitis include:   Sore throat.   Tiredness (fatigue).   Low-grade fever.   Headache.  Joint pain and muscle aches.  Skin rashes.  Swollen lymph nodes.  Plaque-like film on throat or tonsils (often seen with bacterial pharyngitis). DIAGNOSIS  Your health care provider will ask you questions about your illness and your symptoms. Your medical history, along with a physical exam, is often all that is needed to diagnose pharyngitis. Sometimes, a rapid strep test is done. Other lab tests may also be done, depending on the suspected cause.  TREATMENT  Viral pharyngitis will usually get better in 3-4 days without the use of medicine. Bacterial pharyngitis is treated with medicines that kill germs (antibiotics).  HOME CARE INSTRUCTIONS   Drink enough water and fluids to keep your urine clear or pale yellow.   Only take over-the-counter or prescription medicines as directed by your health care provider:   If you are prescribed antibiotics, make sure you finish them even if you start to feel better.   Do not take aspirin.   Get lots of rest.  Gargle with 8 oz of salt water ( tsp of salt per 1 qt of water) as often as every 1-2 hours to soothe your throat.   Throat lozenges (if you are not at risk for choking) or sprays may be used to soothe your throat. SEEK MEDICAL CARE IF:   You have large, tender lumps in your neck.  You have a rash.  You cough up green, yellow-brown, or bloody  spit. SEEK IMMEDIATE MEDICAL CARE IF:   Your neck becomes stiff.  You drool or are unable to swallow liquids.  You vomit or are unable to keep medicines or liquids down.  You have severe pain that does not go away with the use of recommended medicines.  You have trouble breathing (not caused by a stuffy nose). MAKE SURE YOU:   Understand these instructions.  Will watch your condition.  Will get help right away if you are not doing well or get worse. Document Released: 04/25/2005 Document Revised: 02/13/2013 Document Reviewed: 12/31/2012 [facility] Patient Information 2015 [place], [place]. This information is not intended to replace advice given to you by your health care provider. Make sure you discuss any questions you have with your health care provider.

## 2014-07-11 NOTE — Progress Notes (Signed)
Subjective:    Patient ID: Tami Foster, female    DOB: 23-Aug-1980, 34 y.o.   MRN: [id number]  HPI Tami Foster is a 34 y.o. female  Started with sore throat yesterday, nausea today.  No fever. Does have some congestion and rhinorrhea, dry cough. No vomiting. Min diarrhea this am. Did notice exudate on tonsil this morning.   Tx: none.   Multiple sick contacts in family with stomach bug past week or two, and dtr diagnosed with strep throat 2 weeks ago.     Patient Active Problem List   Diagnosis Date Noted  . Encounter for insertion of mirena IUD 05/07/2013  . Anxiety 05/06/2013  . Postpartum depression 03/28/2013  . Normal delivery 02/28/2013  . Fetal growth problem suspected but not found 02/08/2013  . Threatened preterm labor 02/08/2013  . Tobacco abuse 11/28/2012  . Supervision of normal first pregnancy 08/13/2012   Past Medical History  Diagnosis Date  . Abnormal Pap smear   . GERD (gastroesophageal reflux disease)    Past Surgical History  Procedure Laterality Date  . Periurethral abscess drainage      abscess removed from tonsils. Tonsils not removed  . Leep    . Wisdom tooth extraction     No Known Allergies Prior to Admission medications   Medication Sig Start Date End Date Taking? Authorizing Provider  naproxen sodium (ANAPROX DS) 550 MG tablet Take 1 tablet (550 mg total) by mouth 2 (two) times daily with a meal. Take instead of meloxicam. 03/24/14  Yes Emi Belfast, FNP  cyclobenzaprine (FLEXERIL) 10 MG tablet Take 1/2 to 1 tablet qhs prn Patient not taking: Reported on 07/11/2014 02/21/14   Emi Belfast, FNP  meloxicam (MOBIC) 15 MG tablet Take 1 tablet (15 mg total) by mouth daily. Patient not taking: Reported on 07/11/2014 02/21/14   Emi Belfast, FNP  metroNIDAZOLE (FLAGYL) 500 MG tablet Take 1 tablet (500 mg total) by mouth 2 (two) times daily with a meal. DO NOT CONSUME ALCOHOL WHILE TAKING THIS MEDICATION. 03/24/14   Emi Belfast,  FNP  PARoxetine (PAXIL) 10 MG tablet Take 1 tablet (10 mg total) by mouth daily. Patient not taking: Reported on 07/11/2014 02/21/14   Emi Belfast, FNP   History   Social History  . Marital Status: Single    Spouse Name: N/A  . Number of Children: N/A  . Years of Education: N/A   Occupational History  . Not on file.   Social History Main Topics  . Smoking status: Current Every Day Smoker -- 0.50 packs/day for 14 years    Types: Cigarettes  . Smokeless tobacco: Never Used  . Alcohol Use: No  . Drug Use: No  . Sexual Activity:    Partners: Male    Birth Control/ Protection: IUD   Other Topics Concern  . Not on file   Social History Narrative   Review of Systems  Constitutional: Negative for fever.  HENT: Positive for congestion and sore throat.   Gastrointestinal: Positive for nausea and diarrhea (min this am. ). Negative for vomiting.  Genitourinary: Negative for difficulty urinating.  Skin: Negative for rash.        Objective:   Physical Exam  Constitutional: She is oriented to person, place, and time. She appears well-developed and well-nourished. No distress.  HENT:  Head: Normocephalic and atraumatic.  Right Ear: Hearing, tympanic membrane, external ear and ear canal normal.  Left Ear: Hearing, tympanic membrane, external ear and ear canal  normal.  Nose: Nose normal.  Mouth/Throat: Uvula is midline and mucous membranes are normal. Oropharyngeal exudate (R tonsilar) and posterior oropharyngeal erythema present. No tonsillar abscesses.    Eyes: Conjunctivae and EOM are normal. Pupils are equal, round, and reactive to light.  Cardiovascular: Normal rate, regular rhythm, normal heart sounds and intact distal pulses.   No murmur heard. Pulmonary/Chest: Effort normal and breath sounds normal. No respiratory distress. She has no wheezes. She has no rhonchi.  Abdominal: Normal appearance and bowel sounds are normal. There is tenderness (min epigastric to RLQ).  There is no rigidity, no rebound, no guarding, no CVA tenderness and negative Murphy's sign.  Lymphadenopathy:    She has cervical adenopathy (R ant cervical ttp, mildly enlarged. ).  Neurological: She is alert and oriented to person, place, and time.  Skin: Skin is warm and dry. No rash noted.  Psychiatric: She has a normal mood and affect. Her behavior is normal.  Vitals reviewed.  Filed Vitals:   07/11/14 1318  BP: 126/82  Pulse: 116  Temp: 98 F (36.7 C)  TempSrc: Oral  Resp: 17  Weight: 167 lb (75.751 kg)  SpO2: 98%   Results for orders placed or performed in visit on 07/11/14  POCT rapid strep A  Result Value Ref Range   Rapid Strep A Screen Negative Negative       Assessment & Plan:   Tami Foster is a 34 y.o. female Sore throat - Plan: POCT rapid strep A, amoxicillin (AMOXIL) 500 MG capsule  Nausea without vomiting - Plan: ondansetron (ZOFRAN-ODT) disintegrating tablet 4 mg  Exposure to strep throat - Plan: amoxicillin (AMOXIL) 500 MG capsule  Exudative tonsillitis - Plan: amoxicillin (AMOXIL) 500 MG capsule  Possible false negative strep with sick contacts with strep and exudative tonsilitis. Nausea may be from PND, but advised to rtc if increasing abd pain.   -start amoxicillin, throat cx, sx care by AVS, has zofran at home (1 given here). RTC precautions.  Meds ordered this encounter  Medications  . ondansetron (ZOFRAN-ODT) disintegrating tablet 4 mg    Sig:   . amoxicillin (AMOXIL) 500 MG capsule    Sig: Take 1 capsule (500 mg total) by mouth 3 (three) times daily.    Dispense:  30 capsule    Refill:  0   Patient Instructions  Start zofran (that you have at home) for nausea if needed. Start amoxicillin for possible strep throat. Return to the clinic or go to the nearest emergency room if any of your symptoms worsen or new symptoms occur, including any worsening abdominal pain or fevers.         Nausea, Adult Nausea is the feeling that you have  an upset stomach or have to vomit. Nausea by itself is not likely a serious concern, but it may be an early sign of more serious medical problems. As nausea gets worse, it can lead to vomiting. If vomiting develops, there is the risk of dehydration.  CAUSES   Viral infections.  Food poisoning.  Medicines.  Pregnancy.  Motion sickness.  Migraine headaches.  Emotional distress.  Severe pain from any source.  Alcohol intoxication. HOME CARE INSTRUCTIONS  Get plenty of rest.  Ask your caregiver about specific rehydration instructions.  Eat small amounts of food and sip liquids more often.  Take all medicines as told by your caregiver. SEEK MEDICAL CARE IF:  You have not improved after 2 days, or you get worse.  You have a headache. SEEK IMMEDIATE  MEDICAL CARE IF:   You have a fever.  You faint.  You keep vomiting or have blood in your vomit.  You are extremely weak or dehydrated.  You have dark or bloody stools.  You have severe chest or abdominal pain. MAKE SURE YOU:  Understand these instructions.  Will watch your condition.  Will get help right away if you are not doing well or get worse. Document Released: 06/02/2004 Document Revised: 01/18/2012 Document Reviewed: 01/05/2011 [facility] Patient Information 2015 [place], [place]. This information is not intended to replace advice given to you by your health care provider. Make sure you discuss any questions you have with your health care provider.     Pharyngitis Pharyngitis is redness, pain, and swelling (inflammation) of your pharynx.  CAUSES  Pharyngitis is usually caused by infection. Most of the time, these infections are from viruses (viral) and are part of a cold. However, sometimes pharyngitis is caused by bacteria (bacterial). Pharyngitis can also be caused by allergies. Viral pharyngitis may be spread from person to person by coughing, sneezing, and personal items or utensils (cups, forks, spoons,  toothbrushes). Bacterial pharyngitis may be spread from person to person by more intimate contact, such as kissing.  SIGNS AND SYMPTOMS  Symptoms of pharyngitis include:   Sore throat.   Tiredness (fatigue).   Low-grade fever.   Headache.  Joint pain and muscle aches.  Skin rashes.  Swollen lymph nodes.  Plaque-like film on throat or tonsils (often seen with bacterial pharyngitis). DIAGNOSIS  Your health care provider will ask you questions about your illness and your symptoms. Your medical history, along with a physical exam, is often all that is needed to diagnose pharyngitis. Sometimes, a rapid strep test is done. Other lab tests may also be done, depending on the suspected cause.  TREATMENT  Viral pharyngitis will usually get better in 3-4 days without the use of medicine. Bacterial pharyngitis is treated with medicines that kill germs (antibiotics).  HOME CARE INSTRUCTIONS   Drink enough water and fluids to keep your urine clear or pale yellow.   Only take over-the-counter or prescription medicines as directed by your health care provider:   If you are prescribed antibiotics, make sure you finish them even if you start to feel better.   Do not take aspirin.   Get lots of rest.   Gargle with 8 oz of salt water ( tsp of salt per 1 qt of water) as often as every 1-2 hours to soothe your throat.   Throat lozenges (if you are not at risk for choking) or sprays may be used to soothe your throat. SEEK MEDICAL CARE IF:   You have large, tender lumps in your neck.  You have a rash.  You cough up green, yellow-brown, or bloody spit. SEEK IMMEDIATE MEDICAL CARE IF:   Your neck becomes stiff.  You drool or are unable to swallow liquids.  You vomit or are unable to keep medicines or liquids down.  You have severe pain that does not go away with the use of recommended medicines.  You have trouble breathing (not caused by a stuffy nose). MAKE SURE YOU:    Understand these instructions.  Will watch your condition.  Will get help right away if you are not doing well or get worse. Document Released: 04/25/2005 Document Revised: 02/13/2013 Document Reviewed: 12/31/2012 [facility] Patient Information 2015 [place], [place]. This information is not intended to replace advice given to you by your health care provider. Make sure you  discuss any questions you have with your health care provider.

## 2014-07-14 LAB — CULTURE, GROUP A STREP: ORGANISM ID, BACTERIA: NORMAL

## 2014-07-21 ENCOUNTER — Ambulatory Visit (INDEPENDENT_AMBULATORY_CARE_PROVIDER_SITE_OTHER): Payer: BLUE CROSS/BLUE SHIELD | Admitting: Family Medicine

## 2014-07-21 ENCOUNTER — Encounter: Payer: Self-pay | Admitting: Family Medicine

## 2014-07-21 VITALS — BP 130/80 | HR 101 | Temp 98.4°F | Resp 16 | Ht 62.5 in | Wt 163.0 lb

## 2014-07-21 DIAGNOSIS — T8389XD Other specified complication of genitourinary prosthetic devices, implants and grafts, subsequent encounter: Secondary | ICD-10-CM

## 2014-07-21 DIAGNOSIS — R059 Cough, unspecified: Secondary | ICD-10-CM

## 2014-07-21 DIAGNOSIS — T839XXD Unspecified complication of genitourinary prosthetic device, implant and graft, subsequent encounter: Secondary | ICD-10-CM

## 2014-07-21 DIAGNOSIS — R05 Cough: Secondary | ICD-10-CM

## 2014-07-21 MED ORDER — HYDROCOD POLST-CHLORPHEN POLST 10-8 MG/5ML PO LQCR: 5.0000 mL | Freq: Every evening | ORAL | Status: DC | PRN

## 2014-07-21 NOTE — Progress Notes (Signed)
   Subjective:    Patient ID: Tami Foster, female    DOB: 1980/11/26, 34 y.o.   MRN: [id number]  HPI Patient presents today for follow up of cough and sore throat. She was seen 3/4 and prescribed amoxil for suspected strep pharyngitis. - she finished course with some improvement of symptoms- tonsillar exudate resolved. She continues to have morning cough with thick, green sputum that she suspects comes from post nasal drainage. She only has productive cough in the morning. She has some intermittent cough throughout the day and night, but it is dry. Both children have been sick and her youngest was diagnosed with flu last week. The patient feels as though she is getting better every day, but finds morning drainage excessive. She has taken some Dayquil with little improvement. Has not tried OTC analgesics or cough suppressant.   Patient reports that she continues to have vaginal discharge with her IUD and is interested in having it removed.  Her gyn is no longer practicing and she is requesting a referral to another gyn provider.   Review of Systems  No fever, no chills, no wheeze, no SOB, no tightness, no chest pain, no facial pain, no headache, no ear pain. No myalgias/arthralgias. No difficulty swallowing.    Objective:   Physical Exam  Constitutional: She is oriented to person, place, and time. She appears well-developed and well-nourished.  HENT:  Head: Normocephalic and atraumatic.  Right Ear: Tympanic membrane, external ear and ear canal normal.  Left Ear: Tympanic membrane, external ear and ear canal normal.  Nose: Mucosal edema and rhinorrhea present. Right sinus exhibits no maxillary sinus tenderness and no frontal sinus tenderness. Left sinus exhibits no maxillary sinus tenderness and no frontal sinus tenderness.  Mouth/Throat: Uvula is midline and mucous membranes are normal. Posterior oropharyngeal erythema present. No oropharyngeal exudate, posterior oropharyngeal edema or  tonsillar abscesses.  Cardiovascular: Normal rate, regular rhythm and normal heart sounds.   Pulmonary/Chest: Effort normal and breath sounds normal.  Musculoskeletal: Normal range of motion.  Neurological: She is alert and oriented to person, place, and time.  Skin: Skin is warm and dry.  Psychiatric: She has a normal mood and affect. Her behavior is normal. Judgment and thought content normal.  Vitals reviewed.  BP 130/80 mmHg  Pulse 101  Temp(Src) 98.4 F (36.9 C) (Oral)  Resp 16  Ht 5' 2.5" (1.588 m)  Wt 163 lb (73.936 kg)  BMI 29.32 kg/m2  SpO2 97%    Assessment & Plan:  1. Cough - Does not sound infectious in nature, discussed increased symptomatic treatment- mucinex, fluids, benadryl at bedtime, analgesics and warm salt water gargles for throat pain.  - chlorpheniramine-HYDROcodone (TUSSIONEX PENNKINETIC ER) 10-8 MG/5ML LQCR; Take 5 mLs by mouth at bedtime as needed.  Dispense: 70 mL; Refill: 0 - Follow up if no improvement in 3-4 days, sooner if worsening symptoms  2. Complication of intrauterine device (IUD), subsequent encounter - Ambulatory referral to Gynecology   Emi Belfasteborah B. Deeanna Beightol, FNP-BC  [facility], [facility]  07/21/2014 8:46 PM

## 2014-07-21 NOTE — Patient Instructions (Signed)
Try benadryl at bedtime to help dry up secretions Mucinex twice a day to loosen secretions- drink lots of water Prescription cough syrup- 1 teaspoon in the evening/night time- may make you sleepy Naprosyn or tylenol for throat pain- can also gargle with warm salt water to break up mucus.  Cough, Adult  A cough is a reflex that helps clear your throat and airways. It can help heal the body or may be a reaction to an irritated airway. A cough may only last 2 or 3 weeks (acute) or may last more than 8 weeks (chronic).  CAUSES Acute cough:  Viral or bacterial infections. Chronic cough:  Infections.  Allergies.  Asthma.  Post-nasal drip.  Smoking.  Heartburn or acid reflux.  Some medicines.  Chronic lung problems (COPD).  Cancer. SYMPTOMS   Cough.  Fever.  Chest pain.  Increased breathing rate.  High-pitched whistling sound when breathing (wheezing).  Colored mucus that you cough up (sputum). TREATMENT   A bacterial cough may be treated with antibiotic medicine.  A viral cough must run its course and will not respond to antibiotics.  Your caregiver may recommend other treatments if you have a chronic cough. HOME CARE INSTRUCTIONS   Only take over-the-counter or prescription medicines for pain, discomfort, or fever as directed by your caregiver. Use cough suppressants only as directed by your caregiver.  Use a cold steam vaporizer or humidifier in your bedroom or home to help loosen secretions.  Sleep in a semi-upright position if your cough is worse at night.  Rest as needed.  Stop smoking if you smoke. SEEK IMMEDIATE MEDICAL CARE IF:   You have pus in your sputum.  Your cough starts to worsen.  You cannot control your cough with suppressants and are losing sleep.  You begin coughing up blood.  You have difficulty breathing.  You develop pain which is getting worse or is uncontrolled with medicine.  You have a fever. MAKE SURE YOU:   Understand  these instructions.  Will watch your condition.  Will get help right away if you are not doing well or get worse. Document Released: 10/22/2010 Document Revised: 07/18/2011 Document Reviewed: 10/22/2010 [facility] Patient Information 2015 [place], [place]. This information is not intended to replace advice given to you by your health care provider. Make sure you discuss any questions you have with your health care provider.

## 2014-07-24 ENCOUNTER — Encounter: Payer: Self-pay | Admitting: Family Medicine

## 2014-07-26 NOTE — Telephone Encounter (Signed)
Called patient and she said she is feeling some better. Mostly nasal drainage and post nasal drainage. No fever. Has been taking some Benadryl which helps. Recommended long acting antihistamine.

## 2014-08-05 ENCOUNTER — Encounter: Payer: Self-pay | Admitting: Gynecology

## 2014-08-05 ENCOUNTER — Ambulatory Visit (INDEPENDENT_AMBULATORY_CARE_PROVIDER_SITE_OTHER): Payer: BLUE CROSS/BLUE SHIELD | Admitting: Gynecology

## 2014-08-05 ENCOUNTER — Other Ambulatory Visit (HOSPITAL_COMMUNITY)
Admission: RE | Admit: 2014-08-05 | Discharge: 2014-08-05 | Disposition: A | Discharge status: Home / Self Care | Payer: BLUE CROSS/BLUE SHIELD | Source: Ambulatory Visit | Attending: Gynecology | Admitting: Gynecology

## 2014-08-05 VITALS — BP 132/78 | Ht 63.5 in | Wt 166.0 lb

## 2014-08-05 DIAGNOSIS — Z01419 Encounter for gynecological examination (general) (routine) without abnormal findings: Secondary | ICD-10-CM | POA: Diagnosis present

## 2014-08-05 DIAGNOSIS — R635 Abnormal weight gain: Secondary | ICD-10-CM | POA: Diagnosis not present

## 2014-08-05 DIAGNOSIS — N915 Oligomenorrhea, unspecified: Secondary | ICD-10-CM | POA: Diagnosis not present

## 2014-08-05 DIAGNOSIS — Z720 Tobacco use: Secondary | ICD-10-CM | POA: Diagnosis not present

## 2014-08-05 DIAGNOSIS — Z1151 Encounter for screening for human papillomavirus (HPV): Secondary | ICD-10-CM | POA: Diagnosis present

## 2014-08-05 DIAGNOSIS — E282 Polycystic ovarian syndrome: Secondary | ICD-10-CM

## 2014-08-05 DIAGNOSIS — Z30432 Encounter for removal of intrauterine contraceptive device: Secondary | ICD-10-CM | POA: Diagnosis not present

## 2014-08-05 DIAGNOSIS — L680 Hirsutism: Secondary | ICD-10-CM

## 2014-08-05 DIAGNOSIS — E66811 Obesity, class 1: Secondary | ICD-10-CM | POA: Insufficient documentation

## 2014-08-05 DIAGNOSIS — Z3009 Encounter for other general counseling and advice on contraception: Secondary | ICD-10-CM | POA: Diagnosis not present

## 2014-08-05 DIAGNOSIS — F17200 Nicotine dependence, unspecified, uncomplicated: Secondary | ICD-10-CM

## 2014-08-05 DIAGNOSIS — E669 Obesity, unspecified: Secondary | ICD-10-CM | POA: Insufficient documentation

## 2014-08-05 LAB — COMPREHENSIVE METABOLIC PANEL
ALT: 20 U/L (ref 0–35)
AST: 19 U/L (ref 0–37)
Albumin: 4.3 g/dL (ref 3.5–5.2)
Alkaline Phosphatase: 93 U/L (ref 39–117)
BUN: 12 mg/dL (ref 6–23)
CALCIUM: 9.9 mg/dL (ref 8.4–10.5)
CO2: 27 mEq/L (ref 19–32)
Chloride: 104 mEq/L (ref 96–112)
Creat: 0.6 mg/dL (ref 0.50–1.10)
GLUCOSE: 94 mg/dL (ref 70–99)
POTASSIUM: 4.1 meq/L (ref 3.5–5.3)
Sodium: 140 mEq/L (ref 135–145)
Total Bilirubin: 0.5 mg/dL (ref 0.2–1.2)
Total Protein: 7.2 g/dL (ref 6.0–8.3)

## 2014-08-05 LAB — CBC WITH DIFFERENTIAL/PLATELET
BASOS ABS: 0 10*3/uL (ref 0.0–0.1)
Basophils Relative: 0 % (ref 0–1)
EOS PCT: 1 % (ref 0–5)
Eosinophils Absolute: 0.1 10*3/uL (ref 0.0–0.7)
HCT: 42.9 % (ref 36.0–46.0)
Hemoglobin: 14.3 g/dL (ref 12.0–15.0)
Lymphocytes Relative: 33 % (ref 12–46)
Lymphs Abs: 2.4 10*3/uL (ref 0.7–4.0)
MCH: 30.6 pg (ref 26.0–34.0)
MCHC: 33.3 g/dL (ref 30.0–36.0)
MCV: 91.7 fL (ref 78.0–100.0)
MONOS PCT: 9 % (ref 3–12)
MPV: 10.8 fL (ref 8.6–12.4)
Monocytes Absolute: 0.7 10*3/uL (ref 0.1–1.0)
NEUTROS ABS: 4.2 10*3/uL (ref 1.7–7.7)
Neutrophils Relative %: 57 % (ref 43–77)
Platelets: 242 10*3/uL (ref 150–400)
RBC: 4.68 MIL/uL (ref 3.87–5.11)
RDW: 13.3 % (ref 11.5–15.5)
WBC: 7.3 10*3/uL (ref 4.0–10.5)

## 2014-08-05 LAB — TSH: TSH: 0.709 u[IU]/mL (ref 0.350–4.500)

## 2014-08-05 LAB — CHOLESTEROL, TOTAL: CHOLESTEROL: 179 mg/dL (ref 0–200)

## 2014-08-05 MED ORDER — ETONOGESTREL-ETHINYL ESTRADIOL 0.12-0.015 MG/24HR VA RING: VAGINAL_RING | VAGINAL | Status: DC

## 2014-08-05 MED ORDER — SPIRONOLACTONE 25 MG PO TABS: 25.0000 mg | ORAL_TABLET | Freq: Every day | ORAL | Status: DC

## 2014-08-05 NOTE — Progress Notes (Signed)
Tami Foster 1980-07-02 [id number]   History:    34 y.o.  for annual gyn exam who is a new patient to the practice. Patient prior patient of Dr. Antionette CharLisa Jackson Moore. Patient reports that many years ago when she was much younger she had a LEEP cervical conization as had several biopsies in the past. We have no documentation. The last Pap smear that I have seen on file was a Pap smear 2014 which was normal. Patient had a Mirena IUD placed in December 2014 shortly after the delivery of her child. Patient would like to have it removed because of a constant mucus discharge. She states that she had been on the pill in the past without any problem the reason that she went with the IUD was because she would forget to take the pill on a daily basis.  Patient's today complaining also of chin hair as well as on her back. Patient before she was on the birth control pill stated she would go several months without a menstrual cycle and that there was question whether she may have underlying PCOS. She is a chronic smoker also. She denies the use of illicit drugs but does drink alcohol on social basis.  Past medical history,surgical history, family history and social history were all reviewed and documented in the EPIC chart.  Gynecologic History No LMP recorded. Patient is not currently having periods (Reason: IUD). Contraception: IUD Last Pap: 2014. Results were: normal Last mammogram: Not indicated. Results were: Not indicated  Obstetric History OB History  Gravida Para Term Preterm AB SAB TAB Ectopic Multiple Living  2 1  1 1 1    1     # Outcome Date GA Lbr Len/2nd Weight Sex Delivery Anes PTL Lv  2 Preterm 02/28/13 [id number] 06:52 / 02:36 4 lb 10.8 oz (2.121 kg) M Vag-Spont EPI,Local  Y  1 SAB                ROS: A ROS was performed and pertinent positives and negatives are included in the history.  GENERAL: No fevers or chills. HEENT: No change in vision, no earache, sore throat or sinus  congestion. NECK: No pain or stiffness. CARDIOVASCULAR: No chest pain or pressure. No palpitations. PULMONARY: No shortness of breath, cough or wheeze. GASTROINTESTINAL: No abdominal pain, nausea, vomiting or diarrhea, melena or bright red blood per rectum. GENITOURINARY: No urinary frequency, urgency, hesitancy or dysuria. MUSCULOSKELETAL: No joint or muscle pain, no back pain, no recent trauma. DERMATOLOGIC: No rash, no itching, no lesions. ENDOCRINE: No polyuria, polydipsia, no heat or cold intolerance. No recent change in weight. HEMATOLOGICAL: No anemia or easy bruising or bleeding. NEUROLOGIC: No headache, seizures, numbness, tingling or weakness. PSYCHIATRIC: No depression, no loss of interest in normal activity or change in sleep pattern.     Exam: chaperone present  BP 132/78 mmHg  Ht 5' 3.5" (1.613 m)  Wt 166 lb (75.297 kg)  BMI 28.94 kg/m2  Body mass index is 28.94 kg/(m^2).  General appearance : Well developed well nourished female. No acute distress HEENT: Eyes: no retinal hemorrhage or exudates,  Neck supple, trachea midline, no carotid bruits, no thyroidmegaly Lungs: Clear to auscultation, no rhonchi or wheezes, or rib retractions  Heart: Regular rate and rhythm, no murmurs or gallops Breast:Examined in sitting and supine position were symmetrical in appearance, no palpable masses or tenderness,  no skin retraction, no nipple inversion, no nipple discharge, no skin discoloration, no axillary or supraclavicular lymphadenopathy Abdomen:  no palpable masses or tenderness, no rebound or guarding Extremities: no edema or skin discoloration or tenderness  Pelvic:  Bartholin, Urethra, Skene Glands: Within normal limits             Vagina: No gross lesions or discharge  Cervix: No gross lesions or discharge, IUD string seen  Uterus  anteverted, normal size, shape and consistency, non-tender and mobile  Adnexa  Without masses or tenderness  Anus and perineum  normal   Rectovaginal   normal sphincter tone without palpated masses or tenderness             Hemoccult not indicated   As per patient's request the Mirena IUD was removed after the cervix was cleansed with Betadine solution and a [place] clamp was used to grasp the IUD string. The IUD was retrieved shown to the patient and discarded.  Assessment/Plan:  34 y.o. female for annual exam with past history of dysplasia we have no documentation. Patient with past history of LEEP cervical conization. We are going to obtain a Pap smear with HPV screening today and that adhered to the guidelines. She was counseled on the detrimental effects of smoking. We discussed alternative forms of contraception. She is going to be prescribed the NuvaRing for contraception. The risks benefits and pros and cons were discussed to include the risk of DVT and pulmonary embolism. She is not fasting today but we'll do the following screening blood work: CBC, comprehensive metabolic panel, screening cholesterol, and TSH and urinalysis. Because of her hirsutism we are going to check a total testosterone level today as well. We discussed starting her on Aldactone 25 mg daily to prevent any further abnormal hair growth this along with the NuvaRing for ovarian suppression a circulating levels of testosterone will help. She was reminded do her monthly breast exams.   Ok Edwards MD, 10:39 AM 08/05/2014

## 2014-08-05 NOTE — Patient Instructions (Signed)
Polycystic Ovarian Syndrome Polycystic ovarian syndrome (PCOS) is a common hormonal disorder among women of reproductive age. Most women with PCOS grow many small cysts on their ovaries. PCOS can cause problems with your periods and make it difficult to get pregnant. It can also cause an increased risk of miscarriage with pregnancy. If left untreated, PCOS can lead to serious health problems, such as diabetes and heart disease. CAUSES The cause of PCOS is not fully understood, but genetics may be a factor. SIGNS AND SYMPTOMS   Infrequent or no menstrual periods.   Inability to get pregnant (infertility) because of not ovulating.   Increased growth of hair on the face, chest, stomach, back, thumbs, thighs, or toes.   Acne, oily skin, or dandruff.   Pelvic pain.   Weight gain or obesity, usually carrying extra weight around the waist.   Type 2 diabetes.  High cholesterol. Spironolactone tablets What is this medicine? SPIRONOLACTONE (speer on [place] LAK tone) is a diuretic. It helps you make more urine and to lose excess water from your body. This medicine is used to treat high blood pressure, and edema or swelling from heart, kidney, or liver disease. It is also used to treat patients who make too much aldosterone or have low potassium. This medicine may be used for other purposes; ask your health care provider or pharmacist if you have questions. COMMON BRAND NAME(S): Aldactone What should I tell my health care provider before I take this medicine? They need to know if you have any of these conditions: -high blood level of potassium -kidney disease or trouble making urine -liver disease -an unusual or allergic reaction to spironolactone, other medicines, foods, dyes, or preservatives -pregnant or trying to get pregnant -breast-feeding How should I use this medicine? Take this medicine by mouth with a drink of water. Follow the directions on your prescription label. You can take  it with or without food. If it upsets your stomach, take it with food. Do not take your medicine more often than directed. Remember that you will need to pass more urine after taking this medicine. Do not take your doses at a time of day that will cause you problems. Do not take at bedtime. Talk to your pediatrician regarding the use of this medicine in children. While this drug may be prescribed for selected conditions, precautions do apply. Overdosage: If you think you have taken too much of this medicine contact a poison control center or emergency room at once. NOTE: This medicine is only for you. Do not share this medicine with others. What if I miss a dose? If you miss a dose, take it as soon as you can. If it is almost time for your next dose, take only that dose. Do not take double or extra doses. What may interact with this medicine? Do not take this medicine with any of the following medications: -eplerenone This medicine may also interact with the following medications: -corticosteroids -digoxin -lithium -medicines for high blood pressure like ACE inhibitors -skeletal muscle relaxants like tubocurarine -NSAIDs, medicines for pain and inflammation, like ibuprofen or naproxen -potassium products like salt substitute or supplements -pressor amines like norepinephrine -some diuretics This list may not describe all possible interactions. Give your health care provider a list of all the medicines, herbs, non-prescription drugs, or dietary supplements you use. Also tell them if you smoke, drink alcohol, or use illegal drugs. Some items may interact with your medicine. What should I watch for while using this medicine? Visit  your doctor or health care professional for regular checks on your progress. Check your blood pressure as directed. Ask your doctor what your blood pressure should be, and when you should contact them. You may need to be on a special diet while taking this medicine. Ask  your doctor. Also, ask how many glasses of fluid you need to drink a day. You must not get dehydrated. This medicine may make you feel confused, dizzy or lightheaded. Drinking alcohol and taking some medicines can make this worse. Do not drive, use machinery, or do anything that needs mental alertness until you know how this medicine affects you. Do not sit or stand up quickly. What side effects may I notice from receiving this medicine? Side effects that you should report to your doctor or health care professional as soon as possible: -allergic reactions such as skin rash or itching, hives, swelling of the lips, mouth, tongue, or throat -black or tarry stools -fast, irregular heartbeat -fever -muscle pain, cramps -numbness, tingling in hands or feet -trouble breathing -trouble passing urine -unusual bleeding -unusually weak or tired Side effects that usually do not require medical attention (report to your doctor or health care professional if they continue or are bothersome): -change in voice or hair growth -confusion -dizzy, drowsy -dry mouth, increased thirst -enlarged or tender breasts -headache -irregular menstrual periods -sexual difficulty, unable to have an erection -stomach upset This list may not describe all possible side effects. Call your doctor for medical advice about side effects. You may report side effects to FDA at 1-800-FDA-1088. Where should I keep my medicine? Keep out of the reach of children. Store below 25 degrees C (77 degrees F). Throw away any unused medicine after the expiration date. NOTE: This sheet is a summary. It may not cover all possible information. If you have questions about this medicine, talk to your doctor, pharmacist, or health care provider.  2015, Elsevier/Gold Standard. (2010-01-05 12:51:30)    High blood pressure.   Female-pattern baldness or thinning hair.   Patches of thickened and dark brown or black skin on the neck, arms,  breasts, or thighs.   Tiny excess flaps of skin (skin tags) in the armpits or neck area.   Excessive snoring and having breathing stop at times while asleep (sleep apnea).   Deepening of the voice.   Gestational diabetes when pregnant.  DIAGNOSIS  There is no single test to diagnose PCOS.   Your health care provider will:   Take a medical history.   Perform a pelvic exam.   Have ultrasonography done.   Check your female and female hormone levels.   Measure glucose or sugar levels in the blood.   Do other blood tests.   If you are producing too many female hormones, your health care provider will make sure it is from PCOS. At the physical exam, your health care provider will want to evaluate the areas of increased hair growth. Try to allow natural hair growth for a few days before the visit.   During a pelvic exam, the ovaries may be enlarged or swollen because of the increased number of small cysts. This can be seen more easily by using vaginal ultrasonography or screening to examine the ovaries and lining of the uterus (endometrium) for cysts. The uterine lining may become thicker if you have not been having a regular period.  TREATMENT  Because there is no cure for PCOS, it needs to be managed to prevent problems. Treatments are based on your  symptoms. Treatment is also based on whether you want to have a baby or whether you need contraception.  Treatment may include:   Progesterone hormone to start a menstrual period.   Birth control pills to make you have regular menstrual periods.   Medicines to make you ovulate, if you want to get pregnant.   Medicines to control your insulin.   Medicine to control your blood pressure.   Medicine and diet to control your high cholesterol and triglycerides in your blood.  Medicine to reduce excessive hair growth.  Surgery, making small holes in the ovary, to decrease the amount of female hormone production. This is  done through a long, lighted tube (laparoscope) placed into the pelvis through a tiny incision in the lower abdomen.  HOME CARE INSTRUCTIONS  Only take over-the-counter or prescription medicine as directed by your health care provider.  Pay attention to the foods you eat and your activity levels. This can help reduce the effects of PCOS.  Keep your weight under control.  Eat foods that are low in carbohydrate and high in fiber.  Exercise regularly. SEEK MEDICAL CARE IF:  Your symptoms do not get better with medicine.  You have new symptoms. Document Released: 08/19/2004 Document Revised: 02/13/2013 Document Reviewed: 10/11/2012 [facility] Patient Information 2015 [place], [place]. This information is not intended to replace advice given to you by your health care provider. Make sure you discuss any questions you have with your health care provider.

## 2014-08-05 NOTE — Addendum Note (Signed)
Addended by: Berna SpareASTILLO, BLANCA A on: 08/05/2014 11:00 AM   Modules accepted: Orders

## 2014-08-06 LAB — TESTOSTERONE: TESTOSTERONE: 78 ng/dL — AB (ref 10–70)

## 2014-08-07 LAB — CYTOLOGY - PAP

## 2014-10-24 ENCOUNTER — Encounter: Payer: Self-pay | Admitting: Family Medicine

## 2014-11-13 IMAGING — US US ABDOMEN COMPLETE
1 series · 14 of 25 positions shown · non-contrast
Comparison: None.

CLINICAL DATA: Epigastric pain

EXAM:
ABDOMEN ULTRASOUND

[Series 1: us abdomen complete · 14 of 82 slices shown]
[im 1/82]
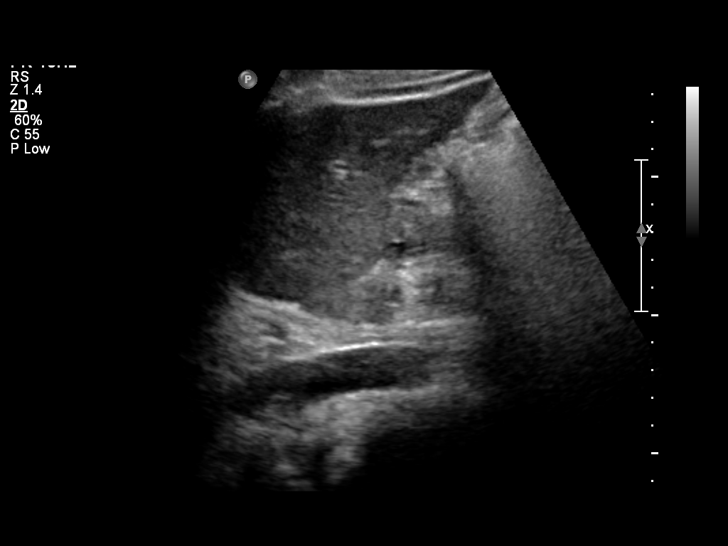
[im 7/82]
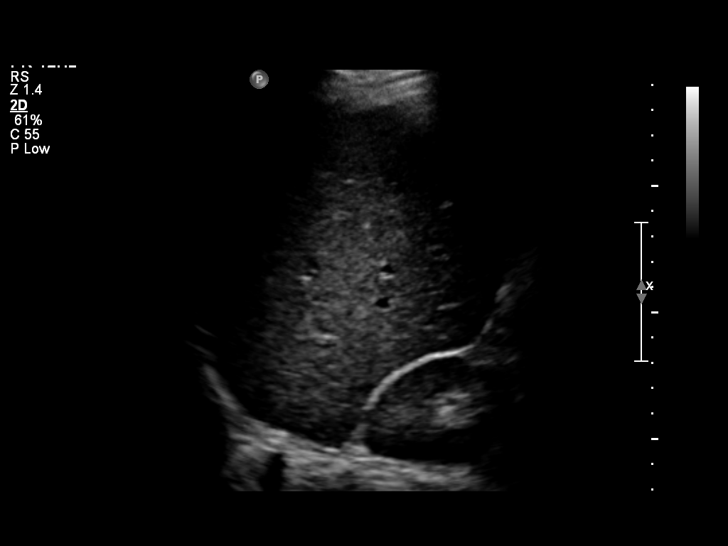
[im 14/82]
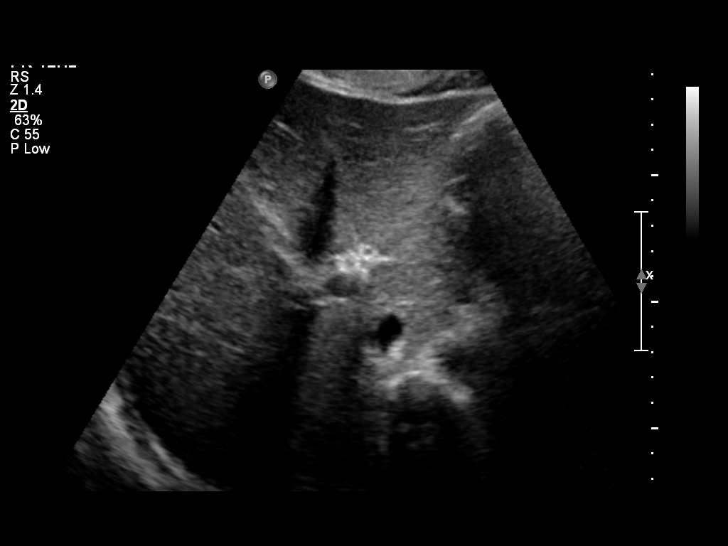
[im 21/82]
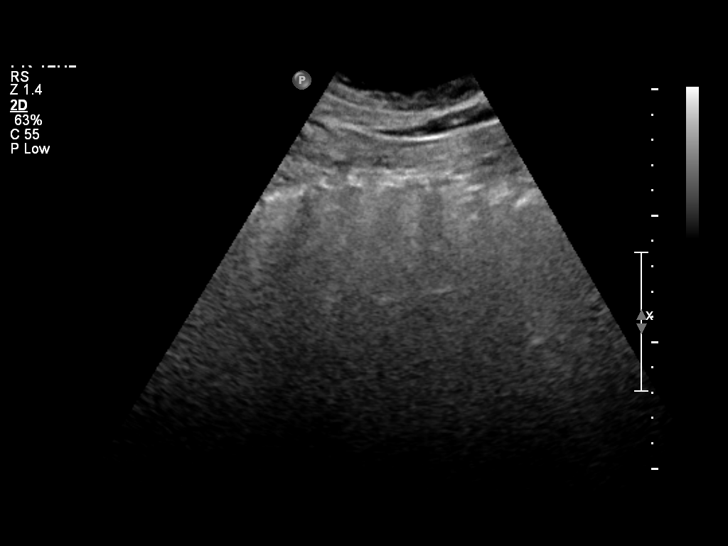
[im 28/82]
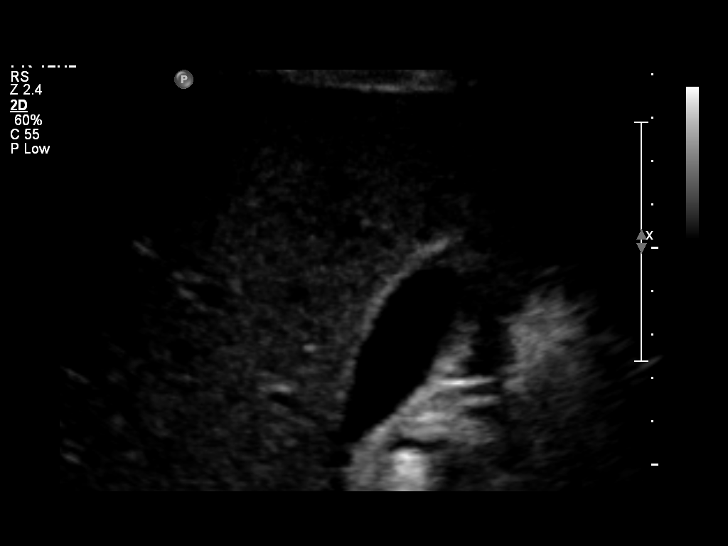
[im 31/82]
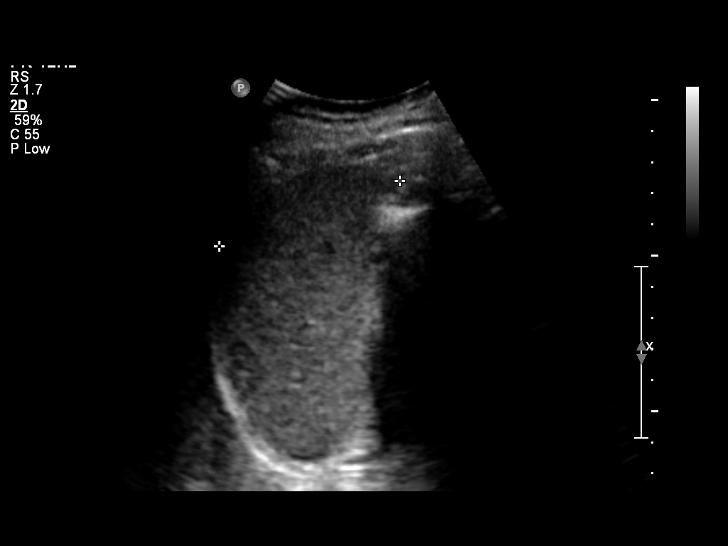
[im 38/82]
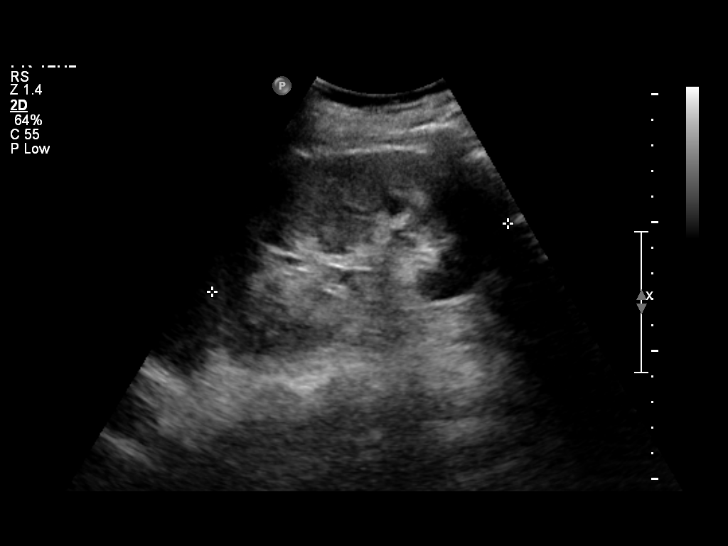
[im 44/82]
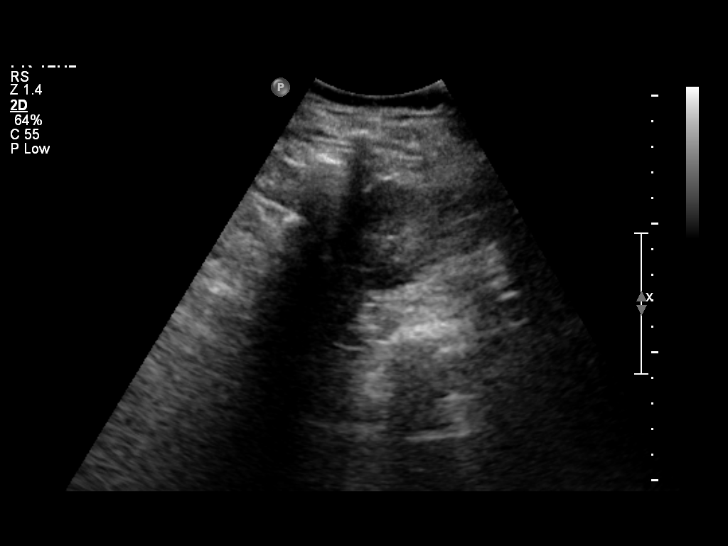
[im 51/82]
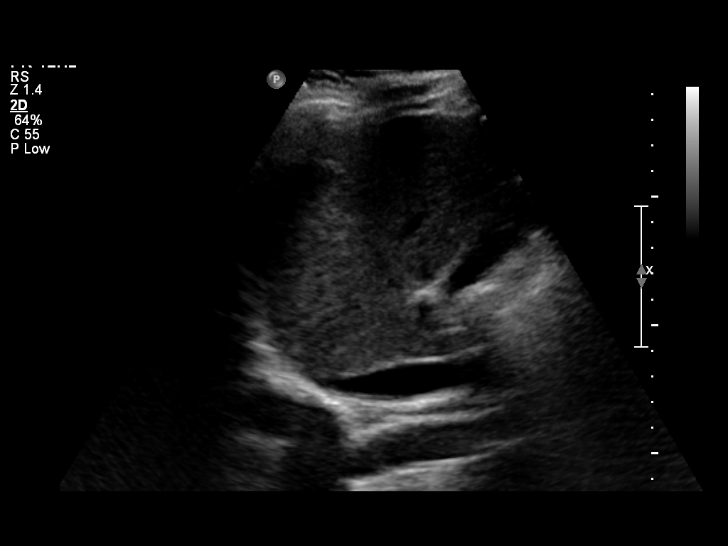
[im 55/82]
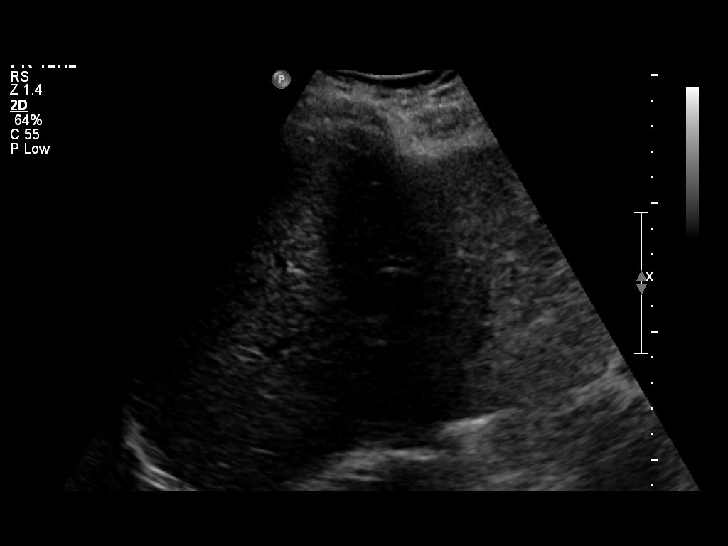
[im 61/82]
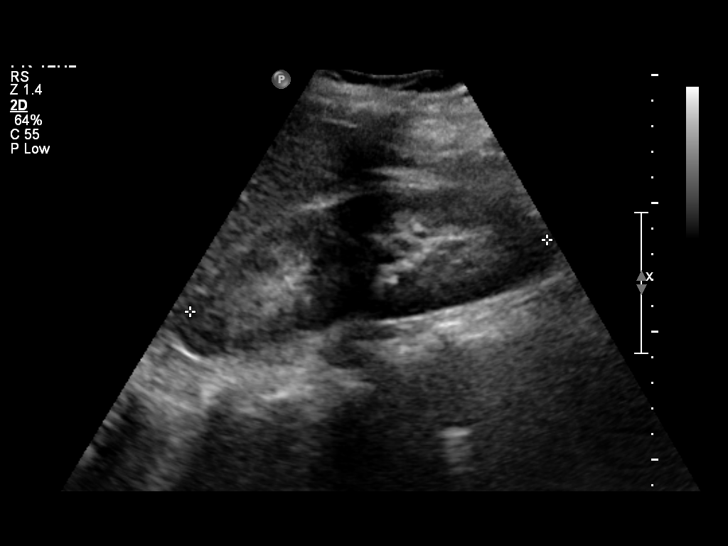
[im 68/82]
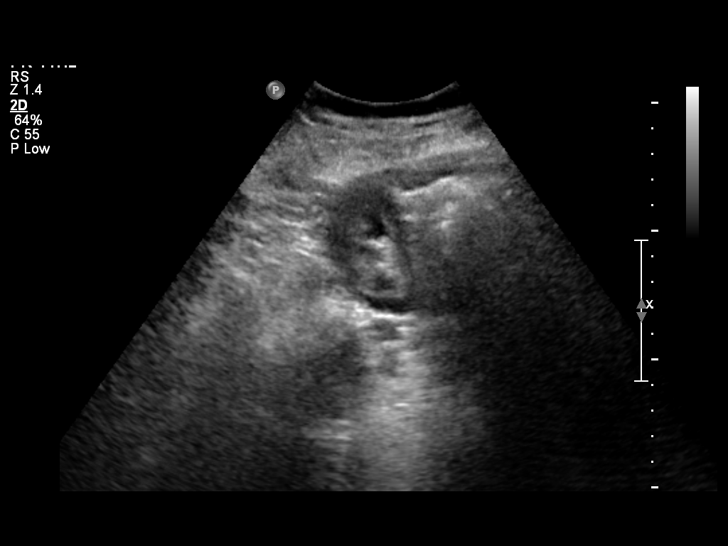
[im 75/82]
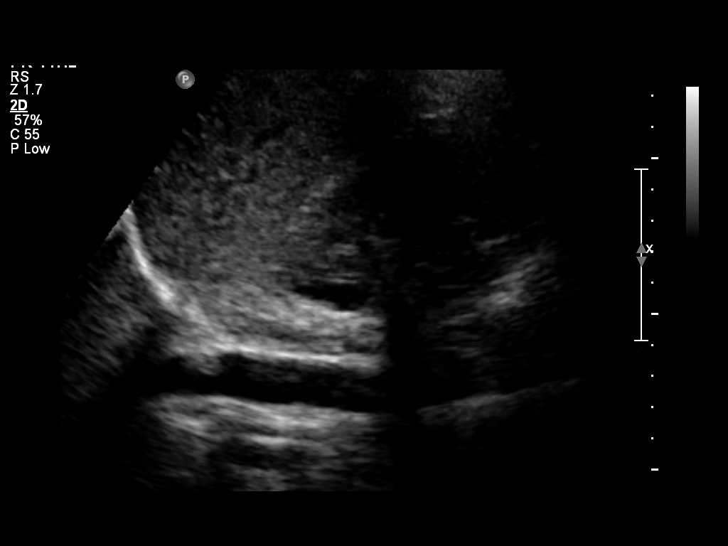
[im 82/82]
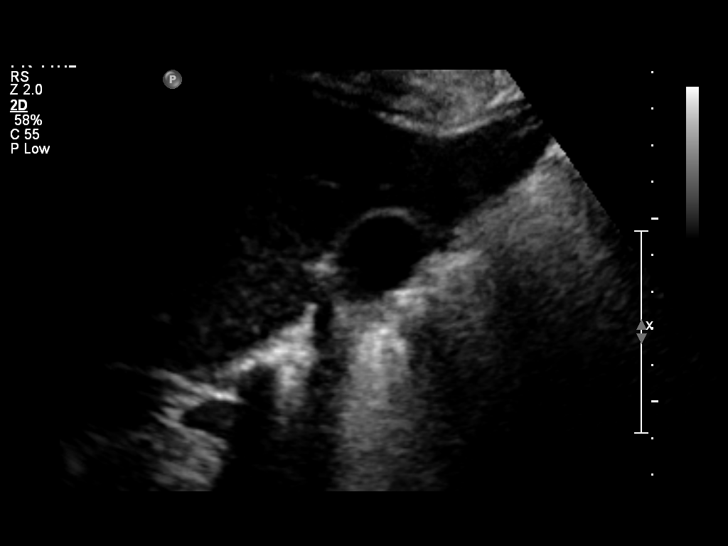

[14 of 25 positions shown; findings below may reference images not displayed]

FINDINGS: Gallbladder

No gallstones or wall thickening. Negative sonographic Murphy's
sign.

Common bile duct

Diameter:

Liver

No focal lesion identified. Within normal limits in parenchymal
echogenicity.

IVC

No abnormality visualized.

Pancreas

Diminished exam detailed

Spleen

Size and appearance within normal limits.

Right Kidney

Length: Measures 14.1 cm. Echogenicity within normal limits. No mass
or hydronephrosis visualized.

Left Kidney

Length: Measures 11.8 cm. Echogenicity within normal limits. No mass
or hydronephrosis visualized.

Abdominal aorta

No aneurysm visualized.
IMPRESSION: No acute findings.

## 2014-11-17 ENCOUNTER — Ambulatory Visit (INDEPENDENT_AMBULATORY_CARE_PROVIDER_SITE_OTHER): Payer: BLUE CROSS/BLUE SHIELD | Admitting: Family Medicine

## 2014-11-17 ENCOUNTER — Encounter: Payer: Self-pay | Admitting: Family Medicine

## 2014-11-17 VITALS — BP 130/70 | HR 90 | Temp 98.4°F | Resp 18 | Ht 63.0 in | Wt 162.0 lb

## 2014-11-17 DIAGNOSIS — F4323 Adjustment disorder with mixed anxiety and depressed mood: Secondary | ICD-10-CM

## 2014-11-17 MED ORDER — VENLAFAXINE HCL ER 37.5 MG PO CP24: 37.5000 mg | ORAL_CAPSULE | Freq: Every day | ORAL | Status: DC

## 2014-11-17 NOTE — Progress Notes (Signed)
   Subjective:    Patient ID: Tami Foster, female    DOB: 1980-05-10, 34 y.o.   MRN: [id number]  HPI This is a very pleasant 34 yo female who is accompanied by her toddler son, Markham Jordanlliot. The patient presents today with continued fatigue and difficulty concentrating. She gets 8 hours of sleep at night, but awakens frequently. She can usually get back to sleep easily, but does not feel rested in the morning. She reports that she has always been fidgety, but this has been worse lately. She feels anxious and irritable. She has noticed this over the last 2 years since her mother died and her son was born. She has been on Paxil in the past and it seemed to work for awhile then was not as effective.  She has been to therapy, but did not find it helpful. Her husband is very supportive and she finds talking to him helpful. She is interested in a daily medication to help with her mood. Labs (CBC, TSH, CMP) unremarkable 3/16. She is currently using Nuva Ring for contraceptive. She denies suicidal or homicidal ideation.   In addition to her 732 yo son, she has two step daughters who are 9,11 and live primarily with her and her husband.   Review of Systems As per HPI    Objective:   Physical Exam Physical Exam  Vitals reviewed. Constitutional: She is oriented to person, place, and time. She appears well-developed and well-nourished.  HENT:  Head: Normocephalic and atraumatic.  Eyes: Conjunctivae are normal.  Neck: Normal range of motion. Neck supple.  Cardiovascular: Normal rate.   Pulmonary/Chest: Effort normal.  Musculoskeletal: Normal range of motion.  Neurological: She is alert and oriented to person, place, and time.  Skin: Skin is warm and dry.  Psychiatric: She has a normal mood and affect. Her behavior is normal. Judgment and thought content normal.   BP 130/70 mmHg  Pulse 90  Temp(Src) 98.4 F (36.9 C) (Oral)  Resp 18  Ht 5\' 3"  (1.6 m)  Wt 162 lb (73.483 kg)  BMI 28.70 kg/m2  LMP  11/04/2014  Wt Readings from Last 3 Encounters:  11/17/14 162 lb (73.483 kg)  08/05/14 166 lb (75.297 kg)  07/21/14 163 lb (73.936 kg)       Assessment & Plan:  1. Adjustment disorder with mixed anxiety and depressed mood - venlafaxine XR (EFFEXOR XR) 37.5 MG 24 hr capsule; Take 1 capsule (37.5 mg total) by mouth daily with breakfast. Take for 1 week, then increase to 2 daily.  Dispense: 60 capsule; Refill: 1 - she will let me know how she is doing in 2-3 weeks via Mychart, then office visit follow up in 2 months, sooner if worsening symptoms. - discussed importance of regular exercise, good sleep hygiene, stress relief measures like yoga/meditation.  Olean Reeeborah Gessner, FNP-BC  [facility], [facility]  11/18/2014 10:03 PM

## 2014-11-17 NOTE — Patient Instructions (Signed)
Please send me an email in 2-3 weeks and let me know how you are doing on the effexor. Venlafaxine extended-release capsules What is this medicine? VENLAFAXINE(VEN la fax een) is used to treat depression, anxiety and panic disorder. This medicine may be used for other purposes; ask your health care provider or pharmacist if you have questions. COMMON BRAND NAME(S): Effexor XR What should I tell my health care provider before I take this medicine? They need to know if you have any of these conditions: -bleeding disorders -glaucoma -heart disease -high blood pressure -high cholesterol -kidney disease -liver disease -low levels of sodium in the blood -mania or bipolar disorder -seizures -suicidal thoughts, plans, or attempt; a previous suicide attempt by you or a family -take medicines that treat or prevent blood clots -thyroid disease -an unusual or allergic reaction to venlafaxine, desvenlafaxine, other medicines, foods, dyes, or preservatives -pregnant or trying to get pregnant -breast-feeding How should I use this medicine? Take this medicine by mouth with a full glass of water. Follow the directions on the prescription label. Do not cut, crush, or chew this medicine. Take it with food. If needed, the capsule may be carefully opened and the entire contents sprinkled on a spoonful of cool applesauce. Swallow the applesauce/pellet mixture right away without chewing and follow with a glass of water to ensure complete swallowing of the pellets. Try to take your medicine at about the same time each day. Do not take your medicine more often than directed. Do not stop taking this medicine suddenly except upon the advice of your doctor. Stopping this medicine too quickly may cause serious side effects or your condition may worsen. A special MedGuide will be given to you by the pharmacist with each prescription and refill. Be sure to read this information carefully each time. Talk to your  pediatrician regarding the use of this medicine in children. Special care may be needed. Overdosage: If you think you have taken too much of this medicine contact a poison control center or emergency room at once. NOTE: This medicine is only for you. Do not share this medicine with others. What if I miss a dose? If you miss a dose, take it as soon as you can. If it is almost time for your next dose, take only that dose. Do not take double or extra doses. What may interact with this medicine? Do not take this medicine with any of the following medications: -certain medicines for fungal infections like fluconazole, itraconazole, ketoconazole, posaconazole, voriconazole -cisapride -desvenlafaxine -dofetilide -dronedarone -duloxetine -levomilnacipran -linezolid -MAOIs like Carbex, Eldepryl, Marplan, Nardil, and Parnate -methylene blue (injected into a vein) -milnacipran -pimozide -thioridazine -ziprasidone This medicine may also interact with the following medications: -aspirin and aspirin-like medicines -certain medicines for depression, anxiety, or psychotic disturbances -certain medicines for migraine headaches like almotriptan, eletriptan, frovatriptan, naratriptan, rizatriptan, sumatriptan, zolmitriptan -certain medicines for sleep -certain medicines that treat or prevent blood clots like dalteparin, enoxaparin, warfarin -cimetidine -clozapine -diuretics -fentanyl -furazolidone -indinavir -isoniazid -lithium -metoprolol -NSAIDS, medicines for pain and inflammation, like ibuprofen or naproxen -other medicines that prolong the QT interval (cause an abnormal heart rhythm) -procarbazine -rasagiline -supplements like St. John's wort, kava kava, valerian -tramadol -tryptophan This list may not describe all possible interactions. Give your health care provider a list of all the medicines, herbs, non-prescription drugs, or dietary supplements you use. Also tell them if you smoke,  drink alcohol, or use illegal drugs. Some items may interact with your medicine. What should I watch for  while using this medicine? Tell your doctor if your symptoms do not get better or if they get worse. Visit your doctor or health care professional for regular checks on your progress. Because it may take several weeks to see the full effects of this medicine, it is important to continue your treatment as prescribed by your doctor. Patients and their families should watch out for new or worsening thoughts of suicide or depression. Also watch out for sudden changes in feelings such as feeling anxious, agitated, panicky, irritable, hostile, aggressive, impulsive, severely restless, overly excited and hyperactive, or not being able to sleep. If this happens, especially at the beginning of treatment or after a change in dose, call your health care professional. This medicine can cause an increase in blood pressure. Check with your doctor for instructions on monitoring your blood pressure while taking this medicine. You may get drowsy or dizzy. Do not drive, use machinery, or do anything that needs mental alertness until you know how this medicine affects you. Do not stand or sit up quickly, especially if you are an older patient. This reduces the risk of dizzy or fainting spells. Alcohol may interfere with the effect of this medicine. Avoid alcoholic drinks. Your mouth may get dry. Chewing sugarless gum, sucking hard candy and drinking plenty of water will help. Contact your doctor if the problem does not go away or is severe. What side effects may I notice from receiving this medicine? Side effects that you should report to your doctor or health care professional as soon as possible: -allergic reactions like skin rash, itching or hives, swelling of the face, lips, or tongue -breathing problems -changes in vision -hallucination, loss of contact with reality -seizures -suicidal thoughts or other mood  changes -trouble passing urine or change in the amount of urine -unusual bleeding or bruising Side effects that usually do not require medical attention (report to your doctor or health care professional if they continue or are bothersome): -change in sex drive or performance -constipation -increased sweating -loss of appetite -nausea -tremors -weight loss This list may not describe all possible side effects. Call your doctor for medical advice about side effects. You may report side effects to FDA at 1-800-FDA-1088. Where should I keep my medicine? Keep out of the reach of children. Store at a controlled temperature between 20 and 25 degrees C (68 degrees and 77 degrees F), in a dry place. Throw away any unused medicine after the expiration date. NOTE: This sheet is a summary. It may not cover all possible information. If you have questions about this medicine, talk to your doctor, pharmacist, or health care provider.  2015, [employer]. (2012-11-20 12:46:03)

## 2014-11-19 ENCOUNTER — Encounter: Payer: Self-pay | Admitting: Family Medicine

## 2014-12-03 ENCOUNTER — Encounter: Payer: Self-pay | Admitting: Family Medicine

## 2015-01-19 ENCOUNTER — Encounter: Payer: Self-pay | Admitting: Family Medicine

## 2015-01-19 ENCOUNTER — Ambulatory Visit (INDEPENDENT_AMBULATORY_CARE_PROVIDER_SITE_OTHER): Payer: BLUE CROSS/BLUE SHIELD | Admitting: Family Medicine

## 2015-01-19 VITALS — BP 118/90 | HR 120 | Temp 98.2°F | Resp 16 | Wt 154.6 lb

## 2015-01-19 DIAGNOSIS — F4323 Adjustment disorder with mixed anxiety and depressed mood: Secondary | ICD-10-CM

## 2015-01-19 NOTE — Progress Notes (Signed)
   Subjective:    Patient ID: Tami Foster, female    DOB: Jan 27, 1981, 34 y.o.   MRN: [id number]  HPI This is a pleasant 34 yo female who presents today for follow up of anxiety and depression. She reports she is doing better with her mood. She is coping better with her mother's death- it was 2 years in July 11, 2022. She does continue to have a tendency to avoid crowds and doesn't enjoy getting out very much. She used to pain for pleasure and hasn't done that in a long time.   She continues to have headaches several times a week. Has one now and thinks it might be related to not having her coffee this morning. She gets good relief with naproxen.   Nose feels dry. Son sick at home, husband sick. No sore throat or fever.   Has been watching her portion sizes and drinking more water.   Nuvaring working well for her. Rarely has periods.   Past Medical History  Diagnosis Date  . Abnormal Pap smear   . GERD (gastroesophageal reflux disease)    Past Surgical History  Procedure Laterality Date  . Periurethral abscess drainage      abscess removed from tonsils. Tonsils not removed  . Leep    . Wisdom tooth extraction     Family History  Problem Relation Age of Onset  . Other Neg Hx   . Bladder Cancer    . Heart disease    . Cancer Mother     bladder  . Hypertension Mother   . Emphysema Father   . Hypertension Father   . Diabetes Paternal Grandmother    Social History  Substance Use Topics  . Smoking status: Current Every Day Smoker -- 0.50 packs/day for 14 years    Types: Cigarettes  . Smokeless tobacco: Never Used  . Alcohol Use: No  Medications, allergies, past medical history, surgical history, family history, social history and problem list reviewed and updated.   Review of Systems No chest pain, no SOB, sleeping better.     Objective:   Physical Exam Physical Exam  Constitutional: Oriented to person, place, and time. She appears well-developed and well-nourished.  HENT:    Head: Normocephalic and atraumatic.  Eyes: Conjunctivae are normal.  Neck: Normal range of motion. Neck supple.  Cardiovascular: Normal rate, regular rhythm and normal heart sounds.  HR 100 on auscultation.  Pulmonary/Chest: Effort normal and breath sounds normal.  Musculoskeletal: Normal range of motion.  Neurological: Alert and oriented to person, place, and time.  Skin: Skin is warm and dry.  Psychiatric: Normal mood and affect. Behavior is normal. Judgment and thought content normal.  Vitals reviewed.  BP 118/90 mmHg  Pulse 120  Temp(Src) 98.2 F (36.8 C) (Oral)  Resp 16  Wt 154 lb 9.6 oz (70.126 kg) Wt Readings from Last 3 Encounters:  01/19/15 154 lb 9.6 oz (70.126 kg)  11/17/14 162 lb (73.483 kg)  08/05/14 166 lb (75.297 kg)      Assessment & Plan:  1. Adjustment disorder with mixed anxiety and depressed mood - venlafaxine XR (EFFEXOR-XR) 75 MG 24 hr capsule; Take 1 capsule (75 mg total) by mouth daily with breakfast.  Dispense: 90 capsule; Refill: 1 - encouraged patient to find some time for her art and exercise - follow up in 4 months  Olean Ree, FNP-BC  [facility], [facility]  01/20/2015 8:32 AM

## 2015-01-20 MED ORDER — VENLAFAXINE HCL ER 75 MG PO CP24: 75.0000 mg | ORAL_CAPSULE | Freq: Every day | ORAL | Status: DC

## 2015-01-22 ENCOUNTER — Encounter: Payer: Self-pay | Admitting: Family Medicine

## 2015-01-23 ENCOUNTER — Other Ambulatory Visit: Payer: Self-pay | Admitting: Family Medicine

## 2015-01-23 DIAGNOSIS — J069 Acute upper respiratory infection, unspecified: Secondary | ICD-10-CM

## 2015-01-23 MED ORDER — AZITHROMYCIN 250 MG PO TABS: ORAL_TABLET | ORAL | Status: DC

## 2015-01-24 ENCOUNTER — Encounter: Payer: Self-pay | Admitting: Family Medicine

## 2015-03-07 ENCOUNTER — Telehealth: Payer: Self-pay | Admitting: Family Medicine

## 2015-03-07 NOTE — Telephone Encounter (Signed)
No answer letter sent of pt new appt time on 05/25/15 @10 :30

## 2015-04-08 ENCOUNTER — Encounter: Payer: Self-pay | Admitting: Family Medicine

## 2015-04-09 ENCOUNTER — Encounter: Payer: Self-pay | Admitting: Family Medicine

## 2015-04-11 ENCOUNTER — Other Ambulatory Visit: Payer: Self-pay | Admitting: Family Medicine

## 2015-04-11 ENCOUNTER — Encounter: Payer: Self-pay | Admitting: Family Medicine

## 2015-04-11 DIAGNOSIS — K5900 Constipation, unspecified: Secondary | ICD-10-CM

## 2015-04-11 DIAGNOSIS — R5383 Other fatigue: Secondary | ICD-10-CM

## 2015-04-11 DIAGNOSIS — L659 Nonscarring hair loss, unspecified: Secondary | ICD-10-CM

## 2015-04-14 ENCOUNTER — Other Ambulatory Visit: Payer: Self-pay | Admitting: Family Medicine

## 2015-04-14 MED ORDER — VENLAFAXINE HCL ER 150 MG PO CP24: 150.0000 mg | ORAL_CAPSULE | Freq: Every day | ORAL | Status: DC

## 2015-04-17 ENCOUNTER — Other Ambulatory Visit (INDEPENDENT_AMBULATORY_CARE_PROVIDER_SITE_OTHER): Payer: BLUE CROSS/BLUE SHIELD | Admitting: Family Medicine

## 2015-04-17 DIAGNOSIS — K5900 Constipation, unspecified: Secondary | ICD-10-CM

## 2015-04-17 DIAGNOSIS — R5383 Other fatigue: Secondary | ICD-10-CM

## 2015-04-17 DIAGNOSIS — L659 Nonscarring hair loss, unspecified: Secondary | ICD-10-CM

## 2015-04-17 LAB — CBC
HEMATOCRIT: 43 % (ref 36.0–46.0)
Hemoglobin: 14.5 g/dL (ref 12.0–15.0)
MCH: 30.9 pg (ref 26.0–34.0)
MCHC: 33.7 g/dL (ref 30.0–36.0)
MCV: 91.7 fL (ref 78.0–100.0)
MPV: 10.6 fL (ref 8.6–12.4)
Platelets: 286 10*3/uL (ref 150–400)
RBC: 4.69 MIL/uL (ref 3.87–5.11)
RDW: 13.4 % (ref 11.5–15.5)
WBC: 8.9 10*3/uL (ref 4.0–10.5)

## 2015-04-18 LAB — TSH: TSH: 1.222 u[IU]/mL (ref 0.350–4.500)

## 2015-04-18 LAB — COMPREHENSIVE METABOLIC PANEL
ALK PHOS: 94 U/L (ref 33–115)
ALT: 13 U/L (ref 6–29)
AST: 13 U/L (ref 10–30)
Albumin: 4.1 g/dL (ref 3.6–5.1)
BUN: 14 mg/dL (ref 7–25)
CALCIUM: 9.4 mg/dL (ref 8.6–10.2)
CHLORIDE: 106 mmol/L (ref 98–110)
CO2: 22 mmol/L (ref 20–31)
Creat: 0.65 mg/dL (ref 0.50–1.10)
GLUCOSE: 89 mg/dL (ref 65–99)
POTASSIUM: 4.2 mmol/L (ref 3.5–5.3)
Sodium: 140 mmol/L (ref 135–146)
Total Bilirubin: 0.3 mg/dL (ref 0.2–1.2)
Total Protein: 7.2 g/dL (ref 6.1–8.1)

## 2015-04-18 LAB — T4, FREE: Free T4: 1.05 ng/dL (ref 0.80–1.80)

## 2015-04-19 ENCOUNTER — Encounter: Payer: Self-pay | Admitting: Family Medicine

## 2015-05-25 ENCOUNTER — Ambulatory Visit: Payer: BLUE CROSS/BLUE SHIELD | Admitting: Family Medicine

## 2015-05-26 ENCOUNTER — Encounter: Payer: Self-pay | Admitting: Family Medicine

## 2015-05-26 ENCOUNTER — Ambulatory Visit (INDEPENDENT_AMBULATORY_CARE_PROVIDER_SITE_OTHER): Payer: BLUE CROSS/BLUE SHIELD | Admitting: Family Medicine

## 2015-05-26 VITALS — BP 137/94 | HR 126 | Temp 98.6°F | Resp 16 | Ht 63.5 in | Wt 161.2 lb

## 2015-05-26 DIAGNOSIS — G4700 Insomnia, unspecified: Secondary | ICD-10-CM | POA: Diagnosis not present

## 2015-05-26 DIAGNOSIS — F4323 Adjustment disorder with mixed anxiety and depressed mood: Secondary | ICD-10-CM

## 2015-05-26 MED ORDER — VENLAFAXINE HCL ER 75 MG PO CP24: 75.0000 mg | ORAL_CAPSULE | Freq: Every day | ORAL | Status: DC

## 2015-05-26 MED ORDER — HYDROXYZINE HCL 25 MG PO TABS: 25.0000 mg | ORAL_TABLET | Freq: Every evening | ORAL | Status: DC | PRN

## 2015-05-26 NOTE — Patient Instructions (Addendum)
Let's try to add effexor 75 mg to your current dose Try the hydroxizine at bedtime for 7-10 days Let me know if you aren't doing better in 2 weeks.

## 2015-05-26 NOTE — Progress Notes (Signed)
Subjective:    Patient ID: Tami Foster, female    DOB: Aug 15, 1980, 35 y.o.   MRN: [id number]  HPI This is a pleasant 35 yo female who presents today for follow up of depression and anxiety. She continues to have difficulty falling and staying asleep. She has noticed dizziness several times a day with spinning, it only lasts for a few seconds. This is increasing in frequency. Feels like her heart rate is fast.  Has taken ambien in the past for sleep without good relief. Same with unisom and trazadone.   Continues to have problems with intermittent constipation, hair loss and excessive sweating. Labs checked 04/17/15 with normal TSH/T4/Free T4, CBC and CMET.  She has 28 year old son at home and two step daughters aged 49,11 who mostly live with the patient and her husband. The relationship with the mother of her stepdaughters is not good. The patient feels like she is always the disciplinarian and tries to create structure in her home life. Good relationship with her husband. Feels like she can't relax if anything is left undone. Was doing ok on Effexor at first, then it seemed to not work as well. She feels like her insomnia is the worse thing right now and she would feel better if she could get some sleep. She and her husband are planning to stop smoking soon.   She continues to use Nuvaring with good results. No plans for pregnancy for at least one year.   Past Medical History  Diagnosis Date  . Abnormal Pap smear   . GERD (gastroesophageal reflux disease)    Past Surgical History  Procedure Laterality Date  . Periurethral abscess drainage      abscess removed from tonsils. Tonsils not removed  . Leep    . Wisdom tooth extraction     Family History  Problem Relation Age of Onset  . Other Neg Hx   . Bladder Cancer    . Heart disease    . Cancer Mother     bladder  . Hypertension Mother   . Emphysema Father   . Hypertension Father   . Diabetes Paternal Grandmother    Social  History  Substance Use Topics  . Smoking status: Current Every Day Smoker -- 0.50 packs/day for 14 years    Types: Cigarettes  . Smokeless tobacco: Never Used  . Alcohol Use: No      Review of Systems Per HPI    Objective:   Physical Exam Physical Exam  Vitals reviewed. Constitutional: Oriented to person, place, and time. Appears well-developed and well-nourished.  HENT:  Head: Normocephalic and atraumatic.  Eyes: Conjunctivae are normal.  Neck: Normal range of motion. Neck supple.  Cardiovascular: Normal rate.   Pulmonary/Chest: Effort normal.  Musculoskeletal: Normal range of motion.  Neurological: Alert and oriented to person, place, and time.  Skin: Skin is warm and dry.  Psychiatric: Normal mood and affect. Behavior is normal. Judgment and thought content normal. Patient does not appear anxious.   BP 137/94 mmHg  Pulse 126  Temp(Src) 98.6 F (37 C) (Oral)  Resp 16  Ht 5' 3.5" (1.613 m)  Wt 161 lb 3.2 oz (73.12 kg)  BMI 28.10 kg/m2  SpO2 98% Wt Readings from Last 3 Encounters:  05/26/15 161 lb 3.2 oz (73.12 kg)  01/19/15 154 lb 9.6 oz (70.126 kg)  11/17/14 162 lb (73.483 kg)  Recheck hr 98.      Assessment & Plan:  1. Adjustment disorder with mixed anxiety  and depressed mood - She is currently taking venlafaxine 150 mg, she just got these filled, will add 75 mg for dose total of 225 mg - venlafaxine XR (EFFEXOR XR) 75 MG 24 hr capsule; Take 1 capsule (75 mg total) by mouth daily with breakfast.  Dispense: 30 capsule; Refill: 1 - again discussed counseling. Patient reports she can talk to her husband. I suggested family counseling to help deal with blended family, patient will look into her insurance coverage for this  2. Insomnia - The patient has tried several things without success, will try hydroxyzine and if no improvement, will try benzodiazapine. - hydrOXYzine (ATARAX/VISTARIL) 25 MG tablet; Take 1 tablet (25 mg total) by mouth at bedtime as needed and  may repeat dose one time if needed.  Dispense: 60 tablet; Refill: 1  - she will let me know how she is doing in 2 weeks via mychart.  - follow up will be based on above interventions  Olean Ree, FNP-BC  [facility], [facility]  05/29/2015 1:37 PM

## 2015-07-03 ENCOUNTER — Encounter: Payer: Self-pay | Admitting: Family Medicine

## 2015-07-06 ENCOUNTER — Encounter: Payer: Self-pay | Admitting: Family Medicine

## 2015-08-04 ENCOUNTER — Other Ambulatory Visit: Payer: Self-pay | Admitting: Gynecology

## 2015-08-05 ENCOUNTER — Other Ambulatory Visit: Payer: Self-pay | Admitting: Gynecology

## 2015-08-30 ENCOUNTER — Other Ambulatory Visit: Payer: Self-pay | Admitting: Gynecology

## 2015-08-31 NOTE — Telephone Encounter (Signed)
Annual on 09/24/15

## 2015-09-01 ENCOUNTER — Other Ambulatory Visit: Payer: Self-pay | Admitting: Gynecology

## 2015-09-02 ENCOUNTER — Other Ambulatory Visit: Payer: Self-pay | Admitting: Gynecology

## 2015-09-02 ENCOUNTER — Other Ambulatory Visit: Payer: Self-pay

## 2015-09-14 ENCOUNTER — Encounter: Payer: Self-pay | Admitting: Family Medicine

## 2015-09-15 MED ORDER — FLUTICASONE PROPIONATE 50 MCG/ACT NA SUSP: 2.0000 | Freq: Every day | NASAL | Status: DC

## 2015-09-15 NOTE — Telephone Encounter (Signed)
Called and spoke to patient. She continues to have runny nose (clear) and ear pain (clear fluid). A lot of draining, cough and headache. Headache better. Headache generalized, worse with getting up. Pain and popping of ears. Has had nausea and vomiting and some sensitivity to light/sound/smells. Is currently taking sudafed pressure and sinus, Zyrtec, Excedrin migraine (none today). Flonase intermittently. Ear pain intermittent. Instructed to come in to clinic if no improvement in 2 days. Will send instructions that we discussed via MyChart.

## 2015-09-23 ENCOUNTER — Encounter: Payer: Self-pay | Admitting: Family Medicine

## 2015-09-24 ENCOUNTER — Other Ambulatory Visit: Payer: Self-pay | Admitting: Family Medicine

## 2015-09-24 ENCOUNTER — Encounter: Payer: Self-pay | Admitting: Family Medicine

## 2015-09-24 ENCOUNTER — Encounter: Payer: BLUE CROSS/BLUE SHIELD | Admitting: Gynecology

## 2015-09-24 MED ORDER — AMOXICILLIN-POT CLAVULANATE 875-125 MG PO TABS: 1.0000 | ORAL_TABLET | Freq: Two times a day (BID) | ORAL | Status: DC

## 2015-09-25 ENCOUNTER — Encounter: Payer: Self-pay | Admitting: Family Medicine

## 2015-09-25 ENCOUNTER — Other Ambulatory Visit: Payer: Self-pay | Admitting: Family Medicine

## 2015-09-25 MED ORDER — FLUCONAZOLE 150 MG PO TABS: 150.0000 mg | ORAL_TABLET | Freq: Once | ORAL | Status: DC

## 2015-09-25 NOTE — Telephone Encounter (Signed)
Called and spoke with patient, feeling a little better. Requests diflucan for yeast secondary to antibiotic- I have sent in an order to her pharmacy. Instructed her to come in if not better.

## 2015-09-29 ENCOUNTER — Ambulatory Visit: Payer: Self-pay | Admitting: Family Medicine

## 2015-09-30 ENCOUNTER — Other Ambulatory Visit: Payer: Self-pay | Admitting: Gynecology

## 2015-09-30 NOTE — Telephone Encounter (Signed)
Annual on 10/26/15

## 2015-10-06 ENCOUNTER — Encounter: Payer: Self-pay | Admitting: Family Medicine

## 2015-10-08 ENCOUNTER — Encounter: Payer: Self-pay | Admitting: Family Medicine

## 2015-10-13 ENCOUNTER — Encounter: Payer: Self-pay | Admitting: Family Medicine

## 2015-10-13 ENCOUNTER — Ambulatory Visit (INDEPENDENT_AMBULATORY_CARE_PROVIDER_SITE_OTHER): Payer: BLUE CROSS/BLUE SHIELD | Admitting: Family Medicine

## 2015-10-13 VITALS — BP 122/86 | HR 98 | Temp 98.2°F | Resp 18 | Ht 63.5 in | Wt 164.2 lb

## 2015-10-13 DIAGNOSIS — F4323 Adjustment disorder with mixed anxiety and depressed mood: Secondary | ICD-10-CM

## 2015-10-13 DIAGNOSIS — M25552 Pain in left hip: Secondary | ICD-10-CM

## 2015-10-13 DIAGNOSIS — M5442 Lumbago with sciatica, left side: Secondary | ICD-10-CM

## 2015-10-13 DIAGNOSIS — IMO0001 Reserved for inherently not codable concepts without codable children: Secondary | ICD-10-CM

## 2015-10-13 DIAGNOSIS — R030 Elevated blood-pressure reading, without diagnosis of hypertension: Secondary | ICD-10-CM | POA: Diagnosis not present

## 2015-10-13 MED ORDER — METOPROLOL SUCCINATE ER 25 MG PO TB24
25.0000 mg | ORAL_TABLET | Freq: Every day | ORAL | Status: DC
Start: 2015-10-13 — End: 2016-10-24

## 2015-10-13 MED ORDER — DULOXETINE HCL 20 MG PO CPEP: 20.0000 mg | ORAL_CAPSULE | Freq: Every day | ORAL | Status: DC

## 2015-10-13 NOTE — Patient Instructions (Addendum)
[facility]  Start duloxetine for 5 days before starting metoprolol.  Metoprolol extended-release tablets What is this medicine? METOPROLOL (me TOE proe lole) is a beta-blocker. Beta-blockers reduce the workload on the heart and help it to beat more regularly. This medicine is used to treat high blood pressure and to prevent chest pain. It is also used to after a heart attack and to prevent an additional heart attack from occurring. This medicine may be used for other purposes; ask your health care provider or pharmacist if you have questions. What should I tell my health care provider before I take this medicine? They need to know if you have any of these conditions: -diabetes -heart or vessel disease like slow heart rate, worsening heart failure, heart block, sick sinus syndrome or Raynaud's disease -kidney disease -liver disease -lung or breathing disease, like asthma or emphysema -pheochromocytoma -thyroid disease -an unusual or allergic reaction to metoprolol, other beta-blockers, medicines, foods, dyes, or preservatives -pregnant or trying to get pregnant -breast-feeding How should I use this medicine? Take this medicine by mouth with a glass of water. Follow the directions on the prescription label. Do not crush or chew. Take this medicine with or immediately after meals. Take your doses at regular intervals. Do not take more medicine than directed. Do not stop taking this medicine suddenly. This could lead to serious heart-related effects. Talk to your pediatrician regarding the use of this medicine in children. While this drug may be prescribed for children as young as 6 years for selected conditions, precautions do apply. Overdosage: If you think you have taken too much of this medicine contact a poison control center or emergency room at once. NOTE: This medicine is only for you. Do not share this medicine with others. What if I miss a dose? If you miss a dose, take it  as soon as you can. If it is almost time for your next dose, take only that dose. Do not take double or extra doses. What may interact with this medicine? This medicine may interact with the following medications: -certain medicines for blood pressure, heart disease, irregular heart beat -certain medicines for depression, like monoamine oxidase (MAO) inhibitors, fluoxetine, or paroxetine -clonidine -dobutamine -epinephrine -isoproterenol -reserpine This list may not describe all possible interactions. Give your health care provider a list of all the medicines, herbs, non-prescription drugs, or dietary supplements you use. Also tell them if you smoke, drink alcohol, or use illegal drugs. Some items may interact with your medicine. What should I watch for while using this medicine? Visit your doctor or health care professional for regular check ups. Contact your doctor right away if your symptoms worsen. Check your blood pressure and pulse rate regularly. Ask your health care professional what your blood pressure and pulse rate should be, and when you should contact them. You may get drowsy or dizzy. Do not drive, use machinery, or do anything that needs mental alertness until you know how this medicine affects you. Do not sit or stand up quickly, especially if you are an older patient. This reduces the risk of dizzy or fainting spells. Contact your doctor if these symptoms continue. Alcohol may interfere with the effect of this medicine. Avoid alcoholic drinks. What side effects may I notice from receiving this medicine? Side effects that you should report to your doctor or health care professional as soon as possible: -allergic reactions like skin rash, itching or hives -cold or numb hands or feet -depression -difficulty breathing -faint -fever  with sore throat -irregular heartbeat, chest pain -rapid weight gain -swollen legs or ankles Side effects that usually do not require medical  attention (report to your doctor or health care professional if they continue or are bothersome): -anxiety or nervousness -change in sex drive or performance -dry skin -headache -nightmares or trouble sleeping -short term memory loss -stomach upset or diarrhea -unusually tired This list may not describe all possible side effects. Call your doctor for medical advice about side effects. You may report side effects to FDA at 1-800-FDA-1088. Where should I keep my medicine? Keep out of the reach of children. Store at room temperature between 15 and 30 degrees C (59 and 86 degrees F). Throw away any unused medicine after the expiration date. NOTE: This sheet is a summary. It may not cover all possible information. If you have questions about this medicine, talk to your doctor, pharmacist, or health care provider.    2016, Elsevier/Gold Standard. (2012-12-28 14:41:37)  Duloxetine delayed-release capsules What is this medicine? DULOXETINE (doo LOX e teen) is used to treat depression, anxiety, and different types of chronic pain. This medicine may be used for other purposes; ask your health care provider or pharmacist if you have questions. What should I tell my health care provider before I take this medicine? They need to know if you have any of these conditions: -bipolar disorder or a family history of bipolar disorder -glaucoma -kidney disease -liver disease -suicidal thoughts or a previous suicide attempt -taken medicines called MAOIs like Carbex, Eldepryl, Marplan, Nardil, and Parnate within 14 days -an unusual reaction to duloxetine, other medicines, foods, dyes, or preservatives -pregnant or trying to get pregnant -breast-feeding How should I use this medicine? Take this medicine by mouth with a glass of water. Follow the directions on the prescription label. Do not cut, crush or chew this medicine. You can take this medicine with or without food. Take your medicine at regular  intervals. Do not take your medicine more often than directed. Do not stop taking this medicine suddenly except upon the advice of your doctor. Stopping this medicine too quickly may cause serious side effects or your condition may worsen. A special MedGuide will be given to you by the pharmacist with each prescription and refill. Be sure to read this information carefully each time. Talk to your pediatrician regarding the use of this medicine in children. While this drug may be prescribed for children as young as 76 years of age for selected conditions, precautions do apply. Overdosage: If you think you have taken too much of this medicine contact a poison control center or emergency room at once. NOTE: This medicine is only for you. Do not share this medicine with others. What if I miss a dose? If you miss a dose, take it as soon as you can. If it is almost time for your next dose, take only that dose. Do not take double or extra doses. What may interact with this medicine? Do not take this medicine with any of the following medications: -certain diet drugs like dexfenfluramine, fenfluramine -desvenlafaxine -linezolid -MAOIs like Azilect, Carbex, Eldepryl, Marplan, Nardil, and Parnate -methylene blue (intravenous) -milnacipran -thioridazine -venlafaxine This medicine may also interact with the following medications: -alcohol -aspirin and aspirin-like medicines -certain antibiotics like ciprofloxacin and enoxacin -certain medicines for blood pressure, heart disease, irregular heart beat -certain medicines for depression, anxiety, or psychotic disturbances -certain medicines for migraine headache like almotriptan, eletriptan, frovatriptan, naratriptan, rizatriptan, sumatriptan, zolmitriptan -certain medicines that treat or prevent  blood clots like warfarin, enoxaparin, and dalteparin -cimetidine -fentanyl -lithium -NSAIDS, medicines for pain and inflammation, like ibuprofen or  naproxen -phentermine -procarbazine -sibutramine -St. John's wort -theophylline -tramadol -tryptophan This list may not describe all possible interactions. Give your health care provider a list of all the medicines, herbs, non-prescription drugs, or dietary supplements you use. Also tell them if you smoke, drink alcohol, or use illegal drugs. Some items may interact with your medicine. What should I watch for while using this medicine? Tell your doctor if your symptoms do not get better or if they get worse. Visit your doctor or health care professional for regular checks on your progress. Because it may take several weeks to see the full effects of this medicine, it is important to continue your treatment as prescribed by your doctor. Patients and their families should watch out for new or worsening thoughts of suicide or depression. Also watch out for sudden changes in feelings such as feeling anxious, agitated, panicky, irritable, hostile, aggressive, impulsive, severely restless, overly excited and hyperactive, or not being able to sleep. If this happens, especially at the beginning of treatment or after a change in dose, call your health care professional. Tami QuinYou may get drowsy or dizzy. Do not drive, use machinery, or do anything that needs mental alertness until you know how this medicine affects you. Do not stand or sit up quickly, especially if you are an older patient. This reduces the risk of dizzy or fainting spells. Alcohol may interfere with the effect of this medicine. Avoid alcoholic drinks. This medicine can cause an increase in blood pressure. This medicine can also cause a sudden drop in your blood pressure, which may make you feel faint and increase the chance of a fall. These effects are most common when you first start the medicine or when the dose is increased, or during use of other medicines that can cause a sudden drop in blood pressure. Check with your doctor for instructions on  monitoring your blood pressure while taking this medicine. Your mouth may get dry. Chewing sugarless gum or sucking hard candy, and drinking plenty of water may help. Contact your doctor if the problem does not go away or is severe. What side effects may I notice from receiving this medicine? Side effects that you should report to your doctor or health care professional as soon as possible: -allergic reactions like skin rash, itching or hives, swelling of the face, lips, or tongue -changes in blood pressure -confusion -dark urine -dizziness -fast talking and excited feelings or actions that are out of control -fast, irregular heartbeat -fever -general ill feeling or flu-like symptoms -hallucination, loss of contact with reality -light-colored stools -loss of balance or coordination -redness, blistering, peeling or loosening of the skin, including inside the mouth -right upper belly pain -seizures -suicidal thoughts or other mood changes -trouble concentrating -trouble passing urine or change in the amount of urine -unusual bleeding or bruising -unusually weak or tired -yellowing of the eyes or skin Side effects that usually do not require medical attention (report to your doctor or health care professional if they continue or are bothersome): -blurred vision -change in appetite -change in sex drive or performance -headache -increased sweating -nausea This list may not describe all possible side effects. Call your doctor for medical advice about side effects. You may report side effects to FDA at 1-800-FDA-1088. Where should I keep my medicine? Keep out of the reach of children. Store at room temperature between 20 and 25  degrees C (68 to 77 degrees F). Throw away any unused medicine after the expiration date. NOTE: This sheet is a summary. It may not cover all possible information. If you have questions about this medicine, talk to your doctor, pharmacist, or health care  provider.    2016, Elsevier/Gold Standard. (2013-04-16 16:28:32)     IF you received an x-ray today, you will receive an invoice from [facility]. Please contact [facility] at [phone] with questions or concerns regarding your invoice.   IF you received labwork today, you will receive an invoice from [employer]. Please contact [facility] at [phone] with questions or concerns regarding your invoice.   Our billing staff will not be able to assist you with questions regarding bills from these companies.  You will be contacted with the lab results as soon as they are available. The fastest way to get your results is to activate your My Chart account. Instructions are located on the last page of this paperwork. If you have not heard from [place] regarding the results in 2 weeks, please contact this office.

## 2015-10-13 NOTE — Progress Notes (Signed)
Subjective:    Patient ID: Tami SartoriusNatalie Foster, female    DOB: 10-31-1980, 35 y.o.   MRN: [id number]  HPI This is a 35 yo female who presents for follow up of anxiety/depression,  She continues to be agitated and more emotional. She stopped effexor about 6-8 weeks ago. She is always tired. Awakes a lot at night. Occasional trouble falling asleep, but this is better. Awakens frequently through the night. Can't "shut off my mind." No improvement with trazadone, vistaril, ambien. Increased stress with husband's ex and being responsible for her step daughters as well as her toddler aged son. She still grieves for her mother who passed away several years ago. She is the go to person in the family. She is not interested in therapy, feels that she has good support from her husband and her sister. She and her husband are not planning on trying to become pregnant in the near future.   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   Has had elevated blood pressure readings since starting EMT school. Has been checking at home and at classes and it has been 150s/100s. She seems to have associated headaches and fleeting dizziness. Not sure if this is related to increased physical activity with EMT school combined with increased stressors.   Intermittent back and hip pain. Occasionally pain shoots down left leg. This has gotten worse with recent weight gain. Feels better with stretching. Takes occasional ibuprofen 400 mg with good relief. No numbness, no tingling, no weakness, no falls. Has been trying to wear more supportive shoes which helps.   Past Medical History  Diagnosis Date  . Abnormal Pap smear   . GERD (gastroesophageal reflux disease)    Past Surgical History  Procedure Laterality Date  . Periurethral abscess drainage      abscess removed from tonsils. Tonsils not removed  . Leep    . Wisdom tooth extraction     Family History  Problem Relation Age of Onset  . Other  Neg Hx   . Bladder Cancer    . Heart disease    . Cancer Mother     bladder  . Hypertension Mother   . Emphysema Father   . Hypertension Father   . Diabetes Paternal Grandmother    Social History  Substance Use Topics  . Smoking status: Current Every Day Smoker -- 0.50 packs/day for 14 years    Types: Cigarettes  . Smokeless tobacco: Never Used  . Alcohol Use: No      Review of Systems Per HPI    Objective:   Physical Exam Physical Exam  Vitals reviewed. Constitutional: Oriented to person, place, and time. Appears well-developed and well-nourished.  HENT:  Head: Normocephalic and atraumatic.  Eyes: Conjunctivae are normal.  Neck: Normal range of motion. Neck supple.  Cardiovascular: Normal rate.   Pulmonary/Chest: Effort normal.  Musculoskeletal: Normal range of motion.  Neurological: Alert and oriented to person, place, and time.  Skin: Skin is warm and dry.  Psychiatric: Normal mood and affect. Behavior is normal. Judgment and thought content normal.    BP 122/86 mmHg  Pulse 98  Temp(Src) 98.2 F (36.8 C) (Oral)  Resp 18  Ht 5' 3.5" (1.613 m)  Wt 164 lb 3.2 oz (74.481 kg)  BMI 28.63 kg/m2  SpO2 95% Wt Readings from Last 3 Encounters:  10/13/15 164 lb 3.2 oz (74.481 kg)  05/26/15 161 lb 3.2 oz (73.12 kg)  01/19/15 154 lb 9.6 oz (  70.126 kg)      Assessment & Plan:  1. Adjustment disorder with mixed anxiety and depressed mood - discussed benefits of therapy, encouraged daily exercise, regular sleep schedule. She did not want anything for sleep- is afraid she won't hear her son. - DULoxetine (CYMBALTA) 20 MG capsule; Take 1 capsule (20 mg total) by mouth daily.  Dispense: 30 capsule; Refill: 5  2. Elevated blood pressure - her heart rate typically runs 90-110s, this may help some with anxiety, discussed that it may increase her fatigue. She will check her BPs at home and notify me of consistently high readings or side effects - metoprolol succinate  (TOPROL-XL) 25 MG 24 hr tablet; Take 1 tablet (25 mg total) by mouth daily.  Dispense: 90 tablet; Refill: 1  3. Left hip pain - she is getting good relief with stretching and low dose otc NSAIDs. Encouraged increased exercise and stretching, wearing supportive shoes throughout the day.  - encouraged weight loss  4. Left-sided low back pain with left-sided sciatica - see number 3  - follow up in 3 months   Olean Ree, FNP-BC  [facility], [facility]  10/15/2015 8:55 AM

## 2015-10-26 ENCOUNTER — Ambulatory Visit (INDEPENDENT_AMBULATORY_CARE_PROVIDER_SITE_OTHER): Payer: BLUE CROSS/BLUE SHIELD | Admitting: Gynecology

## 2015-10-26 ENCOUNTER — Encounter: Payer: Self-pay | Admitting: Gynecology

## 2015-10-26 VITALS — BP 124/80 | Ht 63.0 in | Wt 162.0 lb

## 2015-10-26 DIAGNOSIS — Z01419 Encounter for gynecological examination (general) (routine) without abnormal findings: Secondary | ICD-10-CM | POA: Diagnosis not present

## 2015-10-26 DIAGNOSIS — Z8742 Personal history of other diseases of the female genital tract: Secondary | ICD-10-CM

## 2015-10-26 DIAGNOSIS — L680 Hirsutism: Secondary | ICD-10-CM

## 2015-10-26 MED ORDER — SPIRONOLACTONE 25 MG PO TABS: 25.0000 mg | ORAL_TABLET | Freq: Every day | ORAL | Status: DC

## 2015-10-26 MED ORDER — ETONOGESTREL-ETHINYL ESTRADIOL 0.12-0.015 MG/24HR VA RING: 1.0000 | VAGINAL_RING | VAGINAL | Status: DC

## 2015-10-26 NOTE — Progress Notes (Signed)
Thornell Sartoriusatalie Cambron 29-Jun-1980 [id number]  History:    35 y.o.  for annual gyn exam with a complaint of hair growth in her chin and around her nipple area in different parts of her body. Patient also complains at time of excessive sweating. Her PCP did a TSH 6 months ago which was normal but her total testosterone was found to be slightly elevated at 78. Review of patient's records indicated many years ago she had a LEEP cervical conization as had several biopsies in the past. We have no documentation. The last Pap smear that I have seen on file was a Pap smear 2014 which was normal. As well as 2016 here in our office.. Patient had a Mirena IUD removed last year. She tried the oral contraceptive pill but had compliance issues and has been doing well now with the NuvaRing and reports normal cycles but very light at times. She is a chronic smoker denies any usage of any illicit drugs or alcohol  Past medical history,surgical history, family history and social history were all reviewed and documented in the EPIC chart.  Gynecologic History No LMP recorded. Patient is not currently having periods (Reason: Other). Contraception: NuvaRing vaginal inserts Last Pap: 2016. Results were: normal Last mammogram: Not indicated. Results were: Not indicated  Obstetric History OB History  Gravida Para Term Preterm AB SAB TAB Ectopic Multiple Living  2 1  1 1 1    1     # Outcome Date GA Lbr Len/2nd Weight Sex Delivery Anes PTL Lv  2 Preterm 02/28/13 [id number] 06:52 / 02:36 4 lb 10.8 oz (2.121 kg) M Vag-Spont EPI,Local  Y  1 SAB                ROS: A ROS was performed and pertinent positives and negatives are included in the history.  GENERAL: No fevers or chills. HEENT: No change in vision, no earache, sore throat or sinus congestion. NECK: No pain or stiffness. CARDIOVASCULAR: No chest pain or pressure. No palpitations. PULMONARY: No shortness of breath, cough or wheeze. GASTROINTESTINAL: No abdominal pain,  nausea, vomiting or diarrhea, melena or bright red blood per rectum. GENITOURINARY: No urinary frequency, urgency, hesitancy or dysuria. MUSCULOSKELETAL: No joint or muscle pain, no back pain, no recent trauma. DERMATOLOGIC: Coarse hairs in the chin and periareolar and lower abdomen. ENDOCRINE: No polyuria, polydipsia, no heat or cold intolerance. No recent change in weight. HEMATOLOGICAL: No anemia or easy bruising or bleeding. NEUROLOGIC: No headache, seizures, numbness, tingling or weakness. PSYCHIATRIC: No depression, no loss of interest in normal activity or change in sleep pattern.     Exam: chaperone present  BP 124/80 mmHg  Ht 5\' 3"  (1.6 m)  Wt 162 lb (73.483 kg)  BMI 28.70 kg/m2  Body mass index is 28.7 kg/(m^2).  General appearance : Well developed well nourished female. No acute distress HEENT: Eyes: no retinal hemorrhage or exudates,  Neck supple, trachea midline, no carotid bruits, no thyroidmegaly Lungs: Clear to auscultation, no rhonchi or wheezes, or rib retractions  Heart: Regular rate and rhythm, no murmurs or gallops Breast:Examined in sitting and supine position were symmetrical in appearance, no palpable masses or tenderness,  no skin retraction, no nipple inversion, no nipple discharge, no skin discoloration, no axillary or supraclavicular lymphadenopathy Abdomen: no palpable masses or tenderness, no rebound or guarding Extremities: no edema or skin discoloration or tenderness  Pelvic:  Bartholin, Urethra, Skene Glands: Within normal limits  Vagina: No gross lesions or discharge  Cervix: No gross lesions or discharge  Uterus  anteverted, normal size, shape and consistency, non-tender and mobile  Adnexa  Without masses or tenderness  Anus and perineum  normal   Rectovaginal  normal sphincter tone without palpated masses or tenderness             Hemoccult not indicated     Assessment/Plan:  35 y.o. female for annual exam with history of PCOS and now  hirsutism. We are going to check a TSH, 17 hydroxyprogesterone, DHEAS. We are also going to do an ultrasound to look at her ovary and determine where her source of testosterone is coming from and to compare with last year's testosterone level we'll going to recheck it again this year. Pap smear with HPV screening done today. Patient will be prescribed Aldactone 25 mg to take 1 by mouth daily to help prevent further hair growth. Patient fully aware that if she becomes pregnant she has to discontinue this medication. Literature information was provided. Patient was reminded on the importance of monthly breast exam.   Ok Edwards MD, 2:56 PM 10/26/2015

## 2015-10-26 NOTE — Patient Instructions (Addendum)
Spironolactone tablets  What is this medicine?  SPIRONOLACTONE (speer on oh LAK tone) is a diuretic. It helps you make more urine and to lose excess water from your body. This medicine is used to treat high blood pressure, and edema or swelling from heart, kidney, or liver disease. It is also used to treat patients who make too much aldosterone or have low potassium.  This medicine may be used for other purposes; ask your health care provider or pharmacist if you have questions.  What should I tell my health care provider before I take this medicine?  They need to know if you have any of these conditions:  -high blood level of potassium  -kidney disease or trouble making urine  -liver disease  -an unusual or allergic reaction to spironolactone, other medicines, foods, dyes, or preservatives  -pregnant or trying to get pregnant  -breast-feeding  How should I use this medicine?  Take this medicine by mouth with a drink of water. Follow the directions on your prescription label. You can take it with or without food. If it upsets your stomach, take it with food. Do not take your medicine more often than directed. Remember that you will need to pass more urine after taking this medicine. Do not take your doses at a time of day that will cause you problems. Do not take at bedtime.  Talk to your pediatrician regarding the use of this medicine in children. While this drug may be prescribed for selected conditions, precautions do apply.  Overdosage: If you think you have taken too much of this medicine contact a poison control center or emergency room at once.  NOTE: This medicine is only for you. Do not share this medicine with others.  What if I miss a dose?  If you miss a dose, take it as soon as you can. If it is almost time for your next dose, take only that dose. Do not take double or extra doses.  What may interact with this medicine?  Do not take this medicine with any of the following medications:  -eplerenone  This  medicine may also interact with the following medications:  -corticosteroids  -digoxin  -lithium  -medicines for high blood pressure like ACE inhibitors  -skeletal muscle relaxants like tubocurarine  -NSAIDs, medicines for pain and inflammation, like ibuprofen or naproxen  -potassium products like salt substitute or supplements  -pressor amines like norepinephrine  -some diuretics  This list may not describe all possible interactions. Give your health care provider a list of all the medicines, herbs, non-prescription drugs, or dietary supplements you use. Also tell them if you smoke, drink alcohol, or use illegal drugs. Some items may interact with your medicine.  What should I watch for while using this medicine?  Visit your doctor or health care professional for regular checks on your progress. Check your blood pressure as directed. Ask your doctor what your blood pressure should be, and when you should contact them.  You may need to be on a special diet while taking this medicine. Ask your doctor. Also, ask how many glasses of fluid you need to drink a day. You must not get dehydrated.  This medicine may make you feel confused, dizzy or lightheaded. Drinking alcohol and taking some medicines can make this worse. Do not drive, use machinery, or do anything that needs mental alertness until you know how this medicine affects you. Do not sit or stand up quickly.  What side effects may I notice   throat -black or tarry stools -fast, irregular heartbeat -fever -muscle pain, cramps -numbness, tingling in hands or feet -trouble breathing -trouble passing urine -unusual bleeding -unusually weak or tired Side effects that usually do not require medical attention (report to your doctor or  health care professional if they continue or are bothersome): -change in voice or hair growth -confusion -dizzy, drowsy -dry mouth, increased thirst -enlarged or tender breasts -headache -irregular menstrual periods -sexual difficulty, unable to have an erection -stomach upset This list may not describe all possible side effects. Call your doctor for medical advice about side effects. You may report side effects to FDA at 1-800-FDA-1088. Where should I keep my medicine? Keep out of the reach of children. Store below 25 degrees C (77 degrees F). Throw away any unused medicine after the expiration date. NOTE: This sheet is a summary. It may not cover all possible information. If you have questions about this medicine, talk to your doctor, pharmacist, or health care provider.    2016, Elsevier/Gold Standard. (2010-01-05 12:51:30)  Hirsutism and Masculinization Hirsutism (increased body hair) is the growth of colored (pigmented) thick hair in women. It is most noticeable when it is on the moustache or beard areas. The other common sites are the:  Chest.  Abdomen.  Thighs.  Back. Pubic hair growth may run upward from the usual bikini line to the middle of the abdomen.  Virilism (masculinization) is more extensive than hirsutism. It has extra symptoms. There may be:  Acne.  Oily skin.  Baldness.  Enlargement of the clitoris.  Increased sex drive (libido).  Voice deepening.  Reduced breast size.  Irregular or absent periods.  Aggression. The scalp hair growth may also bald in a typical female pattern. CAUSES  This is caused by too much female sex hormone (androgen) in the body. It can be produced by the ovaries, adrenal glands, and within the skin. Hirsutism is most commonly related to polycystic ovarian syndrome (PCOS). The first signs of increased androgen levels are hirsutism and acne. How sensitive each person is to hormone levels varies greatly. Virilism may result from  higher androgen levels. Some women with hirsutism have normal hormone levels. Eventually there may be female pattern balding. These problems are also connected to difficulty in having children (infertility). In addition, both malignant and benign tumors may cause hirsutism such as tumors of the adrenal gland (adenomas or adenocarcinomas) but this is a rare cause. There is also evidence that insulin resistance may cause the androgynism. This problem is treated with some success with a medicine for diabetes (metformin). Causes that come from outside the body (exogenous) may also lead to hirsutism such as intake of androgens by mouth.  Note: Women of [place], and [place] heritage commonly have facial, abdominal, and thigh hair that is normal for them.  TREATMENT  There are medical treatments that inhibit these conditions, such as:  Combined oral contraceptive pills, if you are not trying to become pregnant.  Medicines that stop the production of hormones (gonadotropins).  Steroids. This may be used if there is evidence of congenital (present since birth) adrenal hyperplasia (abnormal growth of cells).  Metformin for the treatment of virilization, if sensitive to insulin.  Suppression of ovarian hormone production with GnRH analogues (a hormone). They can only be used by themselves for short periods of time. There are a variety of cosmetic treatments. These may be all that you need. They may be effective against occasional problems.  Shaving is the simplest  and most effective in the short term. Bleaching is not usually suitable for severe hirsutism.  Plucking, waxing, sugaring (similar to waxing), and depilatory creams are effective. However, on occasion, they can result in skin irritation (inflammation) or infection.  Electrolysis is effective. Your caregiver can help you decide what needs to be done and what course of treatment will be best for you. Your  caregiver may refer you to an endocrinologist. This is a physician who is specialized in the treatment of glandular disorders.   This information is not intended to replace advice given to you by your health care provider. Make sure you discuss any questions you have with your health care provider.   Document Released: 07/04/2001 Document Revised: 05/16/2014 Document Reviewed: 08/20/2008 Elsevier Interactive Patient Education [employer]2016 Elsevier Inc.

## 2015-10-27 LAB — PAP IG W/ RFLX HPV ASCU

## 2015-10-27 LAB — TESTOSTERONE: TESTOSTERONE: 41 ng/dL

## 2015-10-28 LAB — 17-HYDROXYPROGESTERONE: 17-OH-PROGESTERONE, LC/MS/MS: 39 ng/dL

## 2015-10-29 LAB — HUMAN PAPILLOMAVIRUS, HIGH RISK: HPV DNA High Risk: NOT DETECTED

## 2015-10-31 ENCOUNTER — Encounter: Payer: Self-pay | Admitting: Family Medicine

## 2015-11-01 LAB — DHEA: DHEA: 206 ng/dL (ref 102–1185)

## 2015-11-05 ENCOUNTER — Encounter: Payer: Self-pay | Admitting: Family Medicine

## 2015-11-11 ENCOUNTER — Ambulatory Visit (INDEPENDENT_AMBULATORY_CARE_PROVIDER_SITE_OTHER): Payer: BLUE CROSS/BLUE SHIELD

## 2015-11-11 ENCOUNTER — Encounter: Payer: Self-pay | Admitting: Gynecology

## 2015-11-11 ENCOUNTER — Ambulatory Visit (INDEPENDENT_AMBULATORY_CARE_PROVIDER_SITE_OTHER): Payer: BLUE CROSS/BLUE SHIELD | Admitting: Gynecology

## 2015-11-11 VITALS — BP 128/86

## 2015-11-11 DIAGNOSIS — Z8742 Personal history of other diseases of the female genital tract: Secondary | ICD-10-CM | POA: Diagnosis not present

## 2015-11-11 DIAGNOSIS — L680 Hirsutism: Secondary | ICD-10-CM | POA: Diagnosis not present

## 2015-11-11 DIAGNOSIS — IMO0002 Reserved for concepts with insufficient information to code with codable children: Secondary | ICD-10-CM | POA: Insufficient documentation

## 2015-11-11 DIAGNOSIS — Z87898 Personal history of other specified conditions: Secondary | ICD-10-CM

## 2015-11-11 DIAGNOSIS — R896 Abnormal cytological findings in specimens from other organs, systems and tissues: Secondary | ICD-10-CM

## 2015-11-11 DIAGNOSIS — E282 Polycystic ovarian syndrome: Secondary | ICD-10-CM | POA: Diagnosis not present

## 2015-11-11 DIAGNOSIS — Z872 Personal history of diseases of the skin and subcutaneous tissue: Secondary | ICD-10-CM | POA: Insufficient documentation

## 2015-11-11 NOTE — Progress Notes (Signed)
   Patient was seen in the office on June 19 for her annual exam had been complaining of female hirsutism.Her PCP did a TSH 6 months ago which was normal but her total testosterone was found to be slightly elevated at 78. Review of patient's records indicated many years ago she had a LEEP cervical conization as had several biopsies in the past. We have no documentation. The last Pap smear that I have seen on file was a Pap smear 2014 which was normal. As well as 2016 here in our office.. Patient had a Mirena IUD removed last year. She tried the oral contraceptive pill but had compliance issues and has been doing well now with the NuvaRing and reports normal cycles but very light at times. She is here today to discuss her ultrasound and recent lab work as follows:  Total testosterone was now in the normal range value of 41. 17 hydroxyprogesterone normal range with a value of 39. Pap smear ASCUS negative HPV  Ultrasound: Uterus measures 7.4 x 5.4 x 2.8 cm with endometrial stripe of 1.7 mm. Right and left ovary were normal with numerous follicles right and left ovary increased IM greater than 10 cc. Some fluid in the cul-de-sac was noted.  Assessment/plan: Patient with female hirsutism has responded with a new Mirena. I've asked her to use it continuously and withdrawal every 3 months to suppress circulating levels of testosterone. Also last office visit she was prescribed Aldactone 25 mg 1 by mouth daily for hirsutism as well. Her Pap smear had ASCUS with negative HPV we discussed the new guidelines she will return back in one year for her annual exam will repeat Pap smear with HPV screen as well.  Greater than 50% time was spent in counseling coordinate care of this patient. Tenderness was spent in consultation discussing the above.

## 2016-01-05 ENCOUNTER — Encounter: Payer: Self-pay | Admitting: Family Medicine

## 2016-01-06 ENCOUNTER — Encounter: Payer: Self-pay | Admitting: Family Medicine

## 2016-01-06 ENCOUNTER — Other Ambulatory Visit: Payer: Self-pay | Admitting: Family Medicine

## 2016-01-06 DIAGNOSIS — F4323 Adjustment disorder with mixed anxiety and depressed mood: Secondary | ICD-10-CM

## 2016-01-06 MED ORDER — DULOXETINE HCL 20 MG PO CPEP: 20.0000 mg | ORAL_CAPSULE | Freq: Two times a day (BID) | ORAL | 1 refills | Status: DC

## 2016-02-15 ENCOUNTER — Encounter: Payer: Self-pay | Admitting: Family Medicine

## 2016-02-17 ENCOUNTER — Encounter: Payer: Self-pay | Admitting: Family Medicine

## 2016-02-19 ENCOUNTER — Other Ambulatory Visit: Payer: Self-pay | Admitting: Family Medicine

## 2016-02-19 DIAGNOSIS — R41840 Attention and concentration deficit: Secondary | ICD-10-CM

## 2016-02-19 DIAGNOSIS — F4323 Adjustment disorder with mixed anxiety and depressed mood: Secondary | ICD-10-CM

## 2016-02-19 MED ORDER — BUPROPION HCL ER (XL) 150 MG PO TB24: 150.0000 mg | ORAL_TABLET | Freq: Every day | ORAL | 2 refills | Status: DC

## 2016-03-07 ENCOUNTER — Encounter: Payer: Self-pay | Admitting: Family Medicine

## 2016-03-17 DIAGNOSIS — R41840 Attention and concentration deficit: Secondary | ICD-10-CM | POA: Diagnosis not present

## 2016-03-17 DIAGNOSIS — F411 Generalized anxiety disorder: Secondary | ICD-10-CM | POA: Diagnosis not present

## 2016-03-17 DIAGNOSIS — F331 Major depressive disorder, recurrent, moderate: Secondary | ICD-10-CM | POA: Diagnosis not present

## 2016-03-17 DIAGNOSIS — G4700 Insomnia, unspecified: Secondary | ICD-10-CM | POA: Diagnosis not present

## 2016-03-30 ENCOUNTER — Telehealth: Payer: Self-pay | Admitting: Family Medicine

## 2016-03-30 ENCOUNTER — Other Ambulatory Visit: Payer: Self-pay | Admitting: Family Medicine

## 2016-03-30 MED ORDER — CLONAZEPAM 0.5 MG PO TABS: 0.5000 mg | ORAL_TABLET | Freq: Two times a day (BID) | ORAL | 0 refills | Status: DC | PRN

## 2016-03-30 NOTE — Telephone Encounter (Signed)
Called and spoke with patient who is having a very stressful time. Her elderly grandmother has recently moved in with her from a rehab facility following a fall. She was unaware of her grandmother's needs and has been struggling to take care of her (dementia). Home health has been evaluating and agrees that the grandmother needs placement in assisted living facility. The patient reports frequent crying and loss of temper. She has recently started seeing Dr. Maryruth BunKapur who took her off duloxetine and put her on immediate release bupropion 150 mg BID with good results until the patient's grandmother moved in with her. She also put her on temazepam for sleep which did not help. Discussed short term Clonopin use with patient. She understands potential for abuse. I will call in small amount to her pharmacy.

## 2016-04-06 ENCOUNTER — Encounter: Payer: Self-pay | Admitting: Family Medicine

## 2016-04-08 ENCOUNTER — Encounter: Payer: Self-pay | Admitting: Family Medicine

## 2016-04-22 ENCOUNTER — Encounter: Payer: Self-pay | Admitting: Family Medicine

## 2016-04-25 ENCOUNTER — Encounter: Payer: Self-pay | Admitting: Family Medicine

## 2016-04-29 MED ORDER — BUPROPION HCL ER (XL) 150 MG PO TB24: 150.0000 mg | ORAL_TABLET | Freq: Every day | ORAL | 0 refills | Status: DC

## 2016-05-05 ENCOUNTER — Encounter: Payer: Self-pay | Admitting: Family Medicine

## 2016-05-05 ENCOUNTER — Ambulatory Visit (INDEPENDENT_AMBULATORY_CARE_PROVIDER_SITE_OTHER): Payer: BLUE CROSS/BLUE SHIELD | Admitting: Family Medicine

## 2016-05-05 VITALS — BP 124/76 | HR 110 | Temp 98.3°F | Ht 62.25 in | Wt 163.5 lb

## 2016-05-05 DIAGNOSIS — J069 Acute upper respiratory infection, unspecified: Secondary | ICD-10-CM

## 2016-05-05 DIAGNOSIS — F4323 Adjustment disorder with mixed anxiety and depressed mood: Secondary | ICD-10-CM | POA: Diagnosis not present

## 2016-05-05 MED ORDER — BUPROPION HCL 75 MG PO TABS: 150.0000 mg | ORAL_TABLET | Freq: Two times a day (BID) | ORAL | 3 refills | Status: DC

## 2016-05-05 MED ORDER — ALPRAZOLAM 1 MG PO TABS: ORAL_TABLET | ORAL | 0 refills | Status: DC

## 2016-05-05 MED ORDER — ALBUTEROL SULFATE HFA 108 (90 BASE) MCG/ACT IN AERS: 2.0000 | INHALATION_SPRAY | RESPIRATORY_TRACT | 1 refills | Status: DC | PRN

## 2016-05-05 MED ORDER — AZITHROMYCIN 250 MG PO TABS: ORAL_TABLET | ORAL | 0 refills | Status: DC

## 2016-05-05 NOTE — Progress Notes (Signed)
Subjective:    Patient ID: Thornell SartoriusNatalie Cambron, female    DOB: 07-07-1980, 35 y.o.   MRN: [id number]  HPI This is a pleasant 35 yo female, known to me from [facility], who presents today with continued agitation and crying frequently. Had seen psychiatry (Dr. Maryruth BunKapur) recently who took her off bupropion XL and put on her bupropion SR bid. She was also taken off duloxetine. She was unable to afford visits to Dr. Maryruth BunKapur. She has since run out of Wellbutrin and was sent in Wellbutrin XL which she didn't start. Had used a little clonazapam during rough time with her grandmother, this did not seem to help very much, was also given Restoril by psychiatry which did not improve sleep. Continues to sleep poorly, goes to sleep ok, but wakes after about 4 hours and can't go back to sleep. Has used alprazolam in past with improvement in sleep. No regular exercise. Has some stress at home with step daughters and their mother. Also has 633 yo son. Husband supportive, sister supportive. 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 The patient tried to have her grandmother live with her, but this did not work.   Had an episode of lightheadedness and felt the room tilting, felt hot and felt like left ear was under water. No further episodes. Everyone at home sick with upper respiratory symptoms. Congestion and dry cough. Taking Mucinex without much relief. Continues to smoke, less smoking on buproprion.   Sees Dr. Lily PeerFernandez annually for gyn. Uses Nuva Ring for contraception.   Past Medical History:  Diagnosis Date  . Abnormal Pap smear   . GERD (gastroesophageal reflux disease)    Past Surgical History:  Procedure Laterality Date  . LEEP    . PERIURETHRAL ABSCESS DRAINAGE     abscess removed from tonsils. Tonsils not removed  .  WISDOM TOOTH EXTRACTION     Family History  Problem Relation Age of Onset  . Other Neg Hx   . Cancer Mother     bladder  . Hypertension Mother   . Emphysema Father   . Hypertension Father   . Diabetes Paternal Grandmother    Social History  Substance Use Topics  . Smoking status: Current Every Day Smoker    Packs/day: 0.50    Years: 14.00    Types: Cigarettes  . Smokeless tobacco: Never Used  . Alcohol use 0.0 oz/week     Comment: Rare      Review of Systems Per HPI    Objective:   Physical Exam Physical Exam  Vitals reviewed. Constitutional: Oriented to person, place, and time. Appears well-developed and well-nourished.  HENT:  Head: Normocephalic and atraumatic.  Eyes: Conjunctivae are normal.  Neck: Normal range of motion. Neck supple.  Cardiovascular: Normal rate.   Pulmonary/Chest: Effort normal. Lung sounds normal. Musculoskeletal: Normal range of motion.  Neurological: Alert and oriented to person, place, and time.  Skin: Skin is warm and dry.  Psychiatric: Normal mood and affect. Behavior is normal. Judgment and thought content normal.   BP 124/76   Pulse (!) 110   Temp 98.3 F (36.8 C) (Oral)   Ht 5' 2.25" (1.581 m)   Wt 163 lb 8 oz (74.2 kg)   SpO2 97%   BMI 29.66 kg/m  Wt Readings from Last 3  Encounters:  05/05/16 163 lb 8 oz (74.2 kg)  10/26/15 162 lb (73.5 kg)  10/13/15 164 lb 3.2 oz (74.5 kg)       Assessment & Plan:  1. Adjustment disorder with mixed anxiety and depressed mood - will restart bupropion and provided small amount of alprazolam - discussed potential for abuse and encouraged her to use sparingly and reiterated that this is not a long term treatment - have suggested counseling, she is not currently interested, feels that she has good support and people to talk to - ALPRAZolam (XANAX) 1 MG tablet; Take 1/2 to 1 tablet twice a day as needed for anxiety  Dispense: 30 tablet; Refill: 0 - buPROPion (WELLBUTRIN) 75 MG tablet; Take  2 tablets (150 mg total) by mouth 2 (two) times daily.  Dispense: 60 tablet; Refill: 3  2. Acute upper respiratory infection - likely viral, but with current smoking history, provided wait and see antibiotic prescription if she is not better in 3-5 days - azithromycin (ZITHROMAX) 250 MG tablet; Take 2 tablets today then one tablet every day until finished  Dispense: 6 tablet; Refill: 0 - albuterol (PROVENTIL HFA;VENTOLIN HFA) 108 (90 Base) MCG/ACT inhaler; Inhale 2 puffs into the lungs every 4 (four) hours as needed for wheezing or shortness of breath (cough, shortness of breath or wheezing.).  Dispense: 1 Inhaler; Refill: 1   Olean Reeeborah Shabnam Ladd, FNP-BC  [facility], [facility]  05/07/2016 7:25 AM

## 2016-05-16 ENCOUNTER — Encounter: Payer: Self-pay | Admitting: Family Medicine

## 2016-06-13 ENCOUNTER — Encounter: Payer: Self-pay | Admitting: Family Medicine

## 2016-06-17 ENCOUNTER — Other Ambulatory Visit: Payer: Self-pay | Admitting: Family Medicine

## 2016-06-17 DIAGNOSIS — F4323 Adjustment disorder with mixed anxiety and depressed mood: Secondary | ICD-10-CM

## 2016-06-17 MED ORDER — ALPRAZOLAM 1 MG PO TABS: ORAL_TABLET | ORAL | 0 refills | Status: DC

## 2016-06-17 NOTE — Progress Notes (Signed)
Prescription called in to pharmacy

## 2016-07-20 ENCOUNTER — Ambulatory Visit (INDEPENDENT_AMBULATORY_CARE_PROVIDER_SITE_OTHER): Payer: Self-pay | Admitting: Family Medicine

## 2016-07-20 ENCOUNTER — Encounter: Payer: Self-pay | Admitting: Family Medicine

## 2016-07-20 VITALS — BP 148/110 | HR 105 | Temp 99.0°F | Ht 60.83 in | Wt 164.2 lb

## 2016-07-20 DIAGNOSIS — B373 Candidiasis of vulva and vagina: Secondary | ICD-10-CM

## 2016-07-20 DIAGNOSIS — Z720 Tobacco use: Secondary | ICD-10-CM

## 2016-07-20 DIAGNOSIS — R5382 Chronic fatigue, unspecified: Secondary | ICD-10-CM | POA: Diagnosis not present

## 2016-07-20 DIAGNOSIS — Z23 Encounter for immunization: Secondary | ICD-10-CM | POA: Diagnosis not present

## 2016-07-20 DIAGNOSIS — Z6831 Body mass index (BMI) 31.0-31.9, adult: Secondary | ICD-10-CM | POA: Diagnosis not present

## 2016-07-20 DIAGNOSIS — F4323 Adjustment disorder with mixed anxiety and depressed mood: Secondary | ICD-10-CM | POA: Diagnosis not present

## 2016-07-20 DIAGNOSIS — J069 Acute upper respiratory infection, unspecified: Secondary | ICD-10-CM

## 2016-07-20 DIAGNOSIS — F419 Anxiety disorder, unspecified: Secondary | ICD-10-CM

## 2016-07-20 DIAGNOSIS — E282 Polycystic ovarian syndrome: Secondary | ICD-10-CM

## 2016-07-20 DIAGNOSIS — R05 Cough: Secondary | ICD-10-CM

## 2016-07-20 DIAGNOSIS — I10 Essential (primary) hypertension: Secondary | ICD-10-CM | POA: Diagnosis not present

## 2016-07-20 DIAGNOSIS — R059 Cough, unspecified: Secondary | ICD-10-CM

## 2016-07-20 DIAGNOSIS — E559 Vitamin D deficiency, unspecified: Secondary | ICD-10-CM

## 2016-07-20 DIAGNOSIS — R252 Cramp and spasm: Secondary | ICD-10-CM | POA: Diagnosis not present

## 2016-07-20 DIAGNOSIS — B3731 Acute candidiasis of vulva and vagina: Secondary | ICD-10-CM

## 2016-07-20 LAB — COMPREHENSIVE METABOLIC PANEL
ALBUMIN: 4.3 g/dL (ref 3.5–5.2)
ALK PHOS: 67 U/L (ref 39–117)
ALT: 13 U/L (ref 0–35)
AST: 14 U/L (ref 0–37)
BUN: 10 mg/dL (ref 6–23)
CALCIUM: 9.4 mg/dL (ref 8.4–10.5)
CHLORIDE: 109 meq/L (ref 96–112)
CO2: 23 mEq/L (ref 19–32)
CREATININE: 0.65 mg/dL (ref 0.40–1.20)
GFR: 111.25 mL/min (ref >60.00)
Glucose, Bld: 97 mg/dL (ref 70–99)
POTASSIUM: 3.9 meq/L (ref 3.5–5.1)
Sodium: 137 mEq/L (ref 135–145)
Total Bilirubin: 0.4 mg/dL (ref 0.2–1.2)
Total Protein: 7.4 g/dL (ref 6.0–8.3)

## 2016-07-20 LAB — LIPID PANEL
CHOL/HDL RATIO: 4
Cholesterol: 194 mg/dL (ref 0–200)
HDL: 48.3 mg/dL (ref >39.00)
LDL Cholesterol: 117 mg/dL — ABNORMAL HIGH (ref 0–99)
NonHDL: 145.79
TRIGLYCERIDES: 143 mg/dL (ref 0.0–149.0)
VLDL: 28.6 mg/dL (ref 0.0–40.0)

## 2016-07-20 LAB — CBC
HCT: 43.3 % (ref 36.0–46.0)
HEMOGLOBIN: 14.5 g/dL (ref 12.0–15.0)
MCHC: 33.6 g/dL (ref 30.0–36.0)
MCV: 93 fl (ref 78.0–100.0)
PLATELETS: 228 10*3/uL (ref 150.0–400.0)
RBC: 4.65 Mil/uL (ref 3.87–5.11)
RDW: 13.5 % (ref 11.5–15.5)
WBC: 7.7 10*3/uL (ref 4.0–10.5)

## 2016-07-20 LAB — HEMOGLOBIN A1C: Hgb A1c MFr Bld: 5.4 % (ref 4.6–6.5)

## 2016-07-20 LAB — TSH: TSH: 0.97 u[IU]/mL (ref 0.35–4.50)

## 2016-07-20 LAB — VITAMIN D 25 HYDROXY (VIT D DEFICIENCY, FRACTURES): VITD: 16.4 ng/mL — AB (ref 30.00–100.00)

## 2016-07-20 LAB — MAGNESIUM: MAGNESIUM: 1.9 mg/dL (ref 1.5–2.5)

## 2016-07-20 MED ORDER — FLUCONAZOLE 150 MG PO TABS: 150.0000 mg | ORAL_TABLET | Freq: Once | ORAL | 0 refills | Status: AC

## 2016-07-20 MED ORDER — ALPRAZOLAM 1 MG PO TABS: ORAL_TABLET | ORAL | 0 refills | Status: DC

## 2016-07-20 MED ORDER — METFORMIN HCL 500 MG PO TABS: ORAL_TABLET | ORAL | 1 refills | Status: DC

## 2016-07-20 MED ORDER — AZITHROMYCIN 250 MG PO TABS: ORAL_TABLET | ORAL | 0 refills | Status: DC

## 2016-07-20 NOTE — Patient Instructions (Signed)
Please restart your metoprolol, take daily (bedtime is fine) for at least 3 weeks to see if it helps with blood pressure, heart rate and headaches. Check blood pressure and heart rate 2-3 x a week and log headaches- send me a mychart message with readings Use your inhaler every 4-6 hours for the next two days, then as needed, if worsening cough, congestion, wheezing, fill antibiotic prescription Take a daily antihistamine to help with drainage, sneezing, cough Stop smoking- you can do it!!! Exercise 3-4 times a week, increase time on elliptical by 1 minute a week Reschedule your physical fitness test so you have a goal Make an appointment for eye exam Follow up in 6 months if bp is ok, sooner if it is still running high.

## 2016-07-20 NOTE — Progress Notes (Signed)
Subjective:    Patient ID: Tami SartoriusNatalie Foster, female    DOB: [birth year], 36 y.o.   MRN: [id number]  HPI This is a 36 yo female who presents today for CPE. Feels good, depression is improved. Continues to have intermittent anxiety. Takes xanax occasionally, goes days without using. Drinks wine 2-3 glasses one night on the weekend. None other days of week, does not take xanax if drinking.   Feels like she is getting sick. Has had some wheezing and low grade fever. Clear nasal drainage, itchy, watery eyes. She and her husband went 10 days without smoking and she restarted. She used vape, has not had success with gum or lozenges in the past.   [facility], missed completing course by a few seconds. Felt very tired and out of breath. No chest pain, was able to catch her breath with rest. Had been doing the elliptical for 10 minutes daily and strength exercises.   Checks blood pressure at home, runs 130-140s/90s consistently, heart rate usually high. Was prescribed metoprolol in past, only took a couple of doses, not sure why she stopped. Has chronic headaches, have been a little better lately, has 3x/week, usually over right eye.   Was diagnosed with PCOS by Dr. Lily PeerFernandez. Was started on spironolactone which she took for several weeks, did not see improvement. Continues to sweat frequently, have acne on face and back and excessive facial hair. Uses Nuva ring for contraception, has occasional yeast infection prior to menses. Sometimes requires diflucan.   Last CPE-10/26/15 gyn- Dr. Lennie MuckleFernandez Mammo- NA Pap- annual, last abnormal Tdap- 2007, will have today Flu- doesn't get Eye-not for many years Dental- regular Exercise- off and on, uses home elliptical machine    Past Medical History:  Diagnosis Date  . Abnormal Pap smear   . Anxiety   . GERD (gastroesophageal reflux disease)   . Hypertension    Past Surgical History:  Procedure Laterality Date  .  LEEP    . PERIURETHRAL ABSCESS DRAINAGE     abscess removed from tonsils. Tonsils not removed  . WISDOM TOOTH EXTRACTION     Family History  Problem Relation Age of Onset  . Cancer Mother     bladder  . Hypertension Mother   . Emphysema Father   . Hypertension Father   . Diabetes Paternal Grandmother   . Other Neg Hx    Social History  Substance Use Topics  . Smoking status: Current Every Day Smoker    Packs/day: 0.50    Years: 14.00    Types: Cigarettes  . Smokeless tobacco: Never Used  . Alcohol use 0.0 oz/week     Comment: Rare      Review of Systems  Constitutional: Positive for diaphoresis and fatigue.  HENT: Positive for postnasal drip (always has mucus in throat), rhinorrhea and sneezing.   Respiratory: Positive for cough (dry at night), chest tightness, shortness of breath and wheezing.   Gastrointestinal: Positive for constipation.  Musculoskeletal: Positive for myalgias.  Allergic/Immunologic: Positive for environmental allergies.  Neurological: Positive for headaches (fewer than previous).  Psychiatric/Behavioral: Positive for decreased concentration and sleep disturbance. The patient is nervous/anxious (takes occasional xanax with good relief).        Objective:   Physical Exam  Constitutional: She is oriented to person, place, and time. She appears well-developed and well-nourished. No distress.  HENT:  Head: Normocephalic and atraumatic.  Mouth/Throat: Oropharynx is clear and moist.  Eyes: Conjunctivae are normal.  Neck:  Normal range of motion.  Cardiovascular: Normal rate, regular rhythm and normal heart sounds.   Pulmonary/Chest: Effort normal and breath sounds normal.  Musculoskeletal: Normal range of motion. She exhibits no edema.  Lymphadenopathy:    She has no cervical adenopathy.  Neurological: She is alert and oriented to person, place, and time. She has normal reflexes.  Skin: Skin is warm and dry. She is not diaphoretic.  Psychiatric: She  has a normal mood and affect. Her behavior is normal. Judgment and thought content normal.  Vitals reviewed.     BP (!) 148/110 (BP Location: Right Arm, Patient Position: Sitting, Cuff Size: Normal)   Pulse (!) 105   Temp 99 F (37.2 C) (Oral)   Ht 5' 0.83" (1.545 m)   Wt 164 lb 3.2 oz (74.5 kg)   SpO2 98%   BMI 31.20 kg/m  Wt Readings from Last 3 Encounters:  07/20/16 164 lb 3.2 oz (74.5 kg)  05/05/16 163 lb 8 oz (74.2 kg)  10/26/15 162 lb (73.5 kg)   BP Readings from Last 3 Encounters:  07/20/16 (!) 148/110  05/05/16 124/76  11/11/15 128/86   Depression screen PHQ 2/9 07/20/2016 10/13/2015 05/26/2015 01/19/2015 11/17/2014  Decreased Interest 0 0 0 0 0  Down, Depressed, Hopeless 0 0 0 0 0  PHQ - 2 Score 0 0 0 0 0       Assessment & Plan:  1. PCOS (polycystic ovarian syndrome) - will try on metformin, increase as tolerated - Hemoglobin A1c - metFORMIN (GLUCOPHAGE) 500 MG tablet; Take 1/2 tablet once a day x 3 days then take 1 tablet daily  Dispense: 90 tablet; Refill: 1  2. Anxiety - encouraged regular sleep, regular exercise - CBC - TSH  3. Tobacco abuse - discussed benefits of stopping smoking   4. Need for Tdap vaccination - Tdap vaccine greater than or equal to 7yo IM  5. Cough - sounds good today, encouraged her to use inhaler every 4-6 hours for the next two days then as needed, take antihistamine to help with drainage, allergy symptoms - if not better in 5-7 days, can start antibiotic - azithromycin (ZITHROMAX) 250 MG tablet; Take 2 tablets today then one tablet every day until finished  Dispense: 6 tablet; Refill: 0 - CBC - Comprehensive metabolic panel  6. Chronic fatigue - TSH - Comprehensive metabolic panel - VITAMIN D 25 Hydroxy (Vit-D Deficiency, Fractures)  7. BMI 31.0-31.9,adult - Hemoglobin A1c - Lipid panel - TSH - Comprehensive metabolic panel  8. Vaginal yeast infection - fluconazole (DIFLUCAN) 150 MG tablet; Take 1 tablet (150 mg  total) by mouth once. Repeat if needed  Dispense: 2 tablet; Refill: 0  9. Acute upper respiratory infection - azithromycin (ZITHROMAX) 250 MG tablet; Take 2 tablets today then one tablet every day until finished  Dispense: 6 tablet; Refill: 0  10. Muscle cramping - Magnesium  11. Adjustment disorder with mixed anxiety and depressed mood - discussed potential for dependence, encouraged her to use as sparingly as possible - ALPRAZolam (XANAX) 1 MG tablet; Take 1/2 to 1 tablet twice a day as needed for anxiety  Dispense: 30 tablet; Refill: 0  12. Essential hypertension - restart metoprolol, monitor blood pressure and heart rate at home 2-3x per week, send me the readings via mychart  - follow up in 3 months  Olean Ree, FNP-BC  [facility] [place], [facility]  07/20/2016 2:59 PM

## 2016-07-20 NOTE — Progress Notes (Signed)
Pre visit review using our clinic review tool, if applicable. No additional management support is needed unless otherwise documented below in the visit note. 

## 2016-07-22 MED ORDER — CHOLECALCIFEROL 1.25 MG (50000 UT) PO TABS: 1.0000 | ORAL_TABLET | ORAL | 3 refills | Status: DC

## 2016-07-22 NOTE — Addendum Note (Signed)
Addended by: Olean ReeGESSNER, DEBORAH B on: 07/22/2016 05:03 PM   Modules accepted: Orders

## 2016-07-27 ENCOUNTER — Encounter: Payer: Self-pay | Admitting: Family Medicine

## 2016-08-14 ENCOUNTER — Encounter: Payer: Self-pay | Admitting: Family Medicine

## 2016-08-14 DIAGNOSIS — R1013 Epigastric pain: Secondary | ICD-10-CM

## 2016-08-15 ENCOUNTER — Encounter: Payer: Self-pay | Admitting: Family Medicine

## 2016-08-15 NOTE — Telephone Encounter (Signed)
Called and spoke with patient. Hasn't felt well for a couple of days. Having some abdominal pain, sharp pains in chest that go through to her back. Doesn't feel like normal heartburn. Has been happening for a couple of weeks, occurs after eating. Occurring 2-3 times per week. Relieved with Tums.  Especially bad with pizza, chicken sandwich, spaghetti. Has been using inhaler more frequently. Doesn't hear wheezing, feeling winded.  Will order GB US, discussed using ranitidine 150 mg BID. Made her an appointment for 09/05/16, will check PFTs.

## 2016-08-16 ENCOUNTER — Other Ambulatory Visit: Payer: Self-pay | Admitting: Family Medicine

## 2016-08-16 DIAGNOSIS — F4323 Adjustment disorder with mixed anxiety and depressed mood: Secondary | ICD-10-CM

## 2016-08-17 MED ORDER — ALPRAZOLAM 1 MG PO TABS: ORAL_TABLET | ORAL | 0 refills | Status: DC

## 2016-08-17 NOTE — Telephone Encounter (Signed)
Okay to refill? Last filled 07/21/06 #30 Refills 0

## 2016-08-17 NOTE — Telephone Encounter (Signed)
Prescription phoned into pharmacy.

## 2016-08-17 NOTE — Telephone Encounter (Signed)
OK to phone in.

## 2016-08-18 ENCOUNTER — Ambulatory Visit
Admission: RE | Admit: 2016-08-18 | Discharge: 2016-08-18 | Disposition: A | Discharge status: Home / Self Care | Payer: Commercial Managed Care - PPO | Source: Ambulatory Visit | Attending: Family Medicine | Admitting: Family Medicine

## 2016-08-18 ENCOUNTER — Encounter: Payer: Self-pay | Admitting: Family Medicine

## 2016-08-18 DIAGNOSIS — R1013 Epigastric pain: Secondary | ICD-10-CM

## 2016-08-29 ENCOUNTER — Ambulatory Visit (INDEPENDENT_AMBULATORY_CARE_PROVIDER_SITE_OTHER): Payer: Commercial Managed Care - PPO | Admitting: Family Medicine

## 2016-08-29 ENCOUNTER — Encounter: Payer: Self-pay | Admitting: Family Medicine

## 2016-08-29 VITALS — BP 134/84 | HR 99 | Temp 98.6°F | Wt 165.4 lb

## 2016-08-29 DIAGNOSIS — I10 Essential (primary) hypertension: Secondary | ICD-10-CM | POA: Diagnosis not present

## 2016-08-29 DIAGNOSIS — F419 Anxiety disorder, unspecified: Secondary | ICD-10-CM | POA: Diagnosis not present

## 2016-08-29 DIAGNOSIS — R0602 Shortness of breath: Secondary | ICD-10-CM

## 2016-08-29 DIAGNOSIS — R062 Wheezing: Secondary | ICD-10-CM | POA: Diagnosis not present

## 2016-08-29 DIAGNOSIS — Z720 Tobacco use: Secondary | ICD-10-CM

## 2016-08-29 NOTE — Patient Instructions (Signed)
Increase metoprolol to two tablets at bedtime, keep a log and let me know how your blood pressure is doing.   Fatlossfoodies.com- nutrition advice  Take an over the counter antihistamine at bedtime- Claritin, Zyrtec or Allegra- generic is fine

## 2016-08-29 NOTE — Progress Notes (Signed)
Subjective:    Patient ID: Tami Foster, female    DOB: Mar 26, 1981, 36 y.o.   MRN: [id number]  HPI Patient presents today for follow up of SOB, anxiety, HTN.   SOB/wheeze- Allergies have been bad with clear nasal drainage, post nasal drainage, cough. Does not take antihistamine. Has Flonase, doesn't take. Did not end up taking azithromycin from last visit, sputum and wheeze improved. Still feels winded with exercise. Getting on elliptical for about 5 minutes once a week. Plays outside with kids. Would like to loose weight, but not really making dietary changes. Continues to smoke 1/2-1 ppd.   Anxiety- not taking xanax during day, taking at bedtime several times a week with improvement of sleep. Feels more moody. Continues to take bupropion 150 mg po qd. Using Nuva Ring, in for 3 weeks, out for 1, little bleeding.   HTN- Taking metoprolol daily. Blood pressures running 130/90-100. Feels tired, not necessarily worse with metoprolol.   Past Medical History:  Diagnosis Date  . Abnormal Pap smear   . Anxiety   . GERD (gastroesophageal reflux disease)   . Hypertension    Past Surgical History:  Procedure Laterality Date  . LEEP    . PERIURETHRAL ABSCESS DRAINAGE     abscess removed from tonsils. Tonsils not removed  . WISDOM TOOTH EXTRACTION     .   Review of Systems Per HPI    Objective:   Physical Exam Physical Exam  Vitals reviewed. Constitutional: Oriented to person, place, and time. Appears well-developed and well-nourished.  HENT:  Head: Normocephalic and atraumatic.  Eyes: Conjunctivae are normal.  Neck: Normal range of motion. Neck supple.  Cardiovascular: Normal rate.   Pulmonary/Chest: Effort normal.  Musculoskeletal: Normal range of motion.  Neurological: Alert and oriented to person, place, and time.  Skin: Skin is warm and dry.  Psychiatric: Normal mood and affect. Behavior is normal. Judgment and thought content normal.      BP (!) 136/100 (BP  Location: Right Arm, Patient Position: Sitting, Cuff Size: Normal)   Pulse 99   Temp 98.6 F (37 C) (Oral)   Wt 165 lb 6.4 oz (75 kg)   LMP 08/01/2016   SpO2 97%   BMI 31.43 kg/m  Wt Readings from Last 3 Encounters:  08/29/16 165 lb 6.4 oz (75 kg)  07/20/16 164 lb 3.2 oz (74.5 kg)  05/05/16 163 lb 8 oz (74.2 kg)   BP Readings from Last 3 Encounters:  08/29/16 (!) 136/100  07/20/16 (!) 148/110  05/05/16 124/76   Recheck bp- 134/84 Office Spirometry Results: FEV1: 2.89 liters FVC: 3.38 liters FEV1/FVC: 85.5 % FeF 25-75: 3.76 liters     Assessment & Plan:  1. Shortness of breath - Spirometry with Graph - PFTs normal, suspect patient's weight gain and deconditioning have contributed - discussed weight loss and good food choices  2. Wheezing - Spirometry with Graph - has improved over last couple of weeks, encouraged her to use flonase and take daily antihistamine  3. Tobacco abuse - Spirometry with Graph - encouraged her to continue to consider cessation and pick stop date  4. Anxiety - doing a little better - reminded her of potential for dependence   5. Essential hypertension - not well controlled on metoprolol xl 25 mg, will increase to 50 mg.  - she will send me bp readings  - f/u 3 months  Olean Ree, FNP-BC  [facility] [place], [facility]  08/29/2016 2:23 PM

## 2016-08-29 NOTE — Progress Notes (Signed)
Pre visit review using our clinic review tool, if applicable. No additional management support is needed unless otherwise documented below in the visit note. 

## 2016-09-19 ENCOUNTER — Encounter: Payer: Self-pay | Admitting: Family Medicine

## 2016-09-20 ENCOUNTER — Other Ambulatory Visit: Payer: Self-pay | Admitting: Family Medicine

## 2016-09-20 ENCOUNTER — Encounter: Payer: Self-pay | Admitting: Family Medicine

## 2016-09-20 MED ORDER — CYCLOBENZAPRINE HCL 10 MG PO TABS: 10.0000 mg | ORAL_TABLET | Freq: Every evening | ORAL | 0 refills | Status: DC | PRN

## 2016-09-21 ENCOUNTER — Encounter: Payer: Self-pay | Admitting: Gynecology

## 2016-09-23 ENCOUNTER — Encounter: Payer: Self-pay | Admitting: Family Medicine

## 2016-09-26 ENCOUNTER — Other Ambulatory Visit: Payer: Self-pay | Admitting: Family Medicine

## 2016-09-26 ENCOUNTER — Encounter: Payer: Self-pay | Admitting: Family Medicine

## 2016-09-26 DIAGNOSIS — F4323 Adjustment disorder with mixed anxiety and depressed mood: Secondary | ICD-10-CM

## 2016-09-26 MED ORDER — ALPRAZOLAM 1 MG PO TABS: ORAL_TABLET | ORAL | 0 refills | Status: DC

## 2016-09-27 NOTE — Telephone Encounter (Signed)
Medication called in for patient.  

## 2016-10-04 ENCOUNTER — Encounter: Payer: Self-pay | Admitting: Physician Assistant

## 2016-10-04 ENCOUNTER — Ambulatory Visit (INDEPENDENT_AMBULATORY_CARE_PROVIDER_SITE_OTHER): Payer: Commercial Managed Care - PPO | Admitting: Physician Assistant

## 2016-10-04 ENCOUNTER — Telehealth: Payer: Self-pay | Admitting: Family Medicine

## 2016-10-04 VITALS — BP 150/90 | HR 121 | Temp 99.1°F | Ht 61.0 in | Wt 166.5 lb

## 2016-10-04 DIAGNOSIS — Z720 Tobacco use: Secondary | ICD-10-CM

## 2016-10-04 DIAGNOSIS — J039 Acute tonsillitis, unspecified: Secondary | ICD-10-CM | POA: Diagnosis not present

## 2016-10-04 LAB — POCT RAPID STREP A (OFFICE): Rapid Strep A Screen: NEGATIVE

## 2016-10-04 MED ORDER — AMOXICILLIN 875 MG PO TABS: 875.0000 mg | ORAL_TABLET | Freq: Two times a day (BID) | ORAL | 0 refills | Status: DC

## 2016-10-04 NOTE — Telephone Encounter (Signed)
Noted  

## 2016-10-04 NOTE — Patient Instructions (Signed)
Start oral antibiotic today.  Push fluids, drink water so urine is mostly clear.  Alternate tylenol and ibuprofen to help with pain relief of your throat.  Resume your antihistamine and flonase.  Follow-up if lack of improvement despite treatment or other concerns. Let us know if you develop worsening fever, difficulty opening/closing mouth, or any breathing difficulties.   Tonsillitis Tonsillitis is an infection of the throat that causes the tonsils to become red, tender, and swollen. Tonsils are collections of lymphoid tissue at the back of the throat. Each tonsil has crevices (crypts). Tonsils help fight nose and throat infections and keep infection from spreading to other parts of the body for the first 18 months of life. What are the causes? Sudden (acute) tonsillitis is usually caused by infection with streptococcal bacteria. Long-lasting (chronic) tonsillitis occurs when the crypts of the tonsils become filled with pieces of food and bacteria, which makes it easy for the tonsils to become repeatedly infected. What are the signs or symptoms? Symptoms of tonsillitis include:  A sore throat, with possible difficulty swallowing.  White patches on the tonsils.  Fever.  Tiredness.  New episodes of snoring during sleep, when you did not snore before.  Small, foul-smelling, yellowish-white pieces of material (tonsilloliths) that you occasionally cough up or spit out. The tonsilloliths can also cause you to have bad breath. How is this diagnosed? Tonsillitis can be diagnosed through a physical exam. Diagnosis can be confirmed with the results of lab tests, including a throat culture. How is this treated? The goals of tonsillitis treatment include the reduction of the severity and duration of symptoms and prevention of associated conditions. Symptoms of tonsillitis can be improved with the use of steroids to reduce the swelling. Tonsillitis caused by bacteria can be treated with  antibiotic medicines. Usually, treatment with antibiotic medicines is started before the cause of the tonsillitis is known. However, if it is determined that the cause is not bacterial, antibiotic medicines will not treat the tonsillitis. If attacks of tonsillitis are severe and frequent, your health care provider may recommend surgery to remove the tonsils (tonsillectomy). Follow these instructions at home:  Rest as much as possible and get plenty of sleep.  Drink plenty of fluids. While the throat is very sore, eat soft foods or liquids, such as sherbet, soups, or instant breakfast drinks.  Eat frozen ice pops.  Gargle with a warm or cold liquid to help soothe the throat. Mix 1/4 teaspoon of salt and 1/4 teaspoon of baking soda in 8 oz of water. Contact a health care provider if:  Large, tender lumps develop in your neck.  A rash develops.  A green, yellow-brown, or bloody substance is coughed up.  You are unable to swallow liquids or food for 24 hours.  You notice that only one of the tonsils is swollen. Get help right away if:  You develop any new symptoms such as vomiting, severe headache, stiff neck, chest pain, or trouble breathing or swallowing.  You have severe throat pain along with drooling or voice changes.  You have severe pain, unrelieved with recommended medications.  You are unable to fully open the mouth.  You develop redness, swelling, or severe pain anywhere in the neck.  You have a fever. This information is not intended to replace advice given to you by your health care provider. Make sure you discuss any questions you have with your health care provider. Document Released: 02/02/2005 Document Revised: 10/01/2015 Document Reviewed: 10/12/2012 Elsevier Interactive Patient Education  2017 Elsevier Inc.   

## 2016-10-04 NOTE — Telephone Encounter (Signed)
patient called complaining of strep like sx- pockets in the throat and soreness. Also ear congestion. Scheduled 1030 acute

## 2016-10-04 NOTE — Progress Notes (Signed)
Tami Foster is a 36 y.o. female here for sore throat and left ear pain.  I acted as a Neurosurgeon for [employer], PA-C Corky Mull, LPN  History of Present Illness:   Chief Complaint  Patient presents with  . Sore Throat  . Left ear pain    Sore Throat   This is a new problem. Episode onset: started on Sunday. The problem has been gradually worsening. The pain is worse on the left side. Maximum temperature: 99.1 this morning. The pain is at a severity of 7/10. The pain is moderate. Associated symptoms include coughing, ear pain, headaches, swollen glands and trouble swallowing. Pertinent negatives include no ear discharge or shortness of breath. Associated symptoms comments: Left ear pain, and when takes deep breath hurts.. She has had no exposure to strep or mono. She has tried NSAIDs and cool liquids (also tried Theraflu) for the symptoms. The treatment provided no relief.  Otalgia   There is pain in the left ear. This is a new problem. The current episode started today (around 3:00 AM). The problem has been waxing and waning. The pain is at a severity of 2/10. The pain is mild. Associated symptoms include coughing, headaches and a sore throat. Pertinent negatives include no ear discharge. Associated symptoms comments: Throbbing ear pain when she lies down. She has tried NSAIDs for the symptoms. The treatment provided no relief.   Patient reports that her children have had similar symptoms. She is a smoker. Does not feel like she is well hydrated. Eating and drinking fair, but it hurts to swallow. Denies any issues with breathing or any sensation that her throat is closing up.   PMHx, SurgHx, SocialHx, Medications, and Allergies were reviewed in the Visit Navigator and updated as appropriate.  Current Medications:   Current Outpatient Prescriptions:  .  albuterol (PROVENTIL HFA;VENTOLIN HFA) 108 (90 Base) MCG/ACT inhaler, Inhale 2 puffs into the lungs every 4 (four) hours as needed  for wheezing or shortness of breath (cough, shortness of breath or wheezing.)., Disp: 1 Inhaler, Rfl: 1 .  ALPRAZolam (XANAX) 1 MG tablet, Take 1/2 to 1 tablet twice a day as needed for anxiety, Disp: 30 tablet, Rfl: 0 .  buPROPion (WELLBUTRIN) 75 MG tablet, Take 2 tablets (150 mg total) by mouth 2 (two) times daily., Disp: 60 tablet, Rfl: 3 .  Cholecalciferol 50000 units TABS, Take 1 tablet by mouth once a week. For 8 weeks, then take every two weeks., Disp: 10 tablet, Rfl: 3 .  etonogestrel-ethinyl estradiol (NUVARING) 0.12-0.015 MG/24HR vaginal ring, Place 1 each vaginally every 28 (twenty-eight) days. Insert vaginally and leave in place for 3 consecutive weeks, then remove for 1 week., Disp: 1 each, Rfl: 11 .  metFORMIN (GLUCOPHAGE) 500 MG tablet, Take 1/2 tablet once a day x 3 days then take 1 tablet daily, Disp: 90 tablet, Rfl: 1 .  metoprolol succinate (TOPROL-XL) 25 MG 24 hr tablet, Take 1 tablet (25 mg total) by mouth daily., Disp: 90 tablet, Rfl: 1 .  amoxicillin (AMOXIL) 875 MG tablet, Take 1 tablet (875 mg total) by mouth 2 (two) times daily., Disp: 20 tablet, Rfl: 0 .  cyclobenzaprine (FLEXERIL) 10 MG tablet, Take 1 tablet (10 mg total) by mouth at bedtime as needed for muscle spasms. (Patient not taking: Reported on 10/04/2016), Disp: 20 tablet, Rfl: 0   Review of Systems:   Review of Systems  Constitutional: Positive for fever and malaise/fatigue. Negative for chills and weight loss.  HENT: Positive for ear  pain, sore throat and trouble swallowing. Negative for ear discharge and sinus pain.   Respiratory: Positive for cough. Negative for sputum production and shortness of breath.   Neurological: Positive for headaches.  Endo/Heme/Allergies: Positive for environmental allergies.    Vitals:   Vitals:   10/04/16 1045  BP: (!) 150/90  Pulse: (!) 121  Temp: 99.1 F (37.3 C)  TempSrc: Oral  SpO2: 97%  Weight: 166 lb 8 oz (75.5 kg)  Height: 5\' 1"  (1.549 m)     Body mass index  is 31.46 kg/m.  Physical Exam:   Physical Exam  Constitutional: She appears well-developed. She is cooperative.  Non-toxic appearance. She does not have a sickly appearance. She does not appear ill. No distress.  HENT:  Head: Normocephalic and atraumatic.  Right Ear: Tympanic membrane, external ear and ear canal normal. Tympanic membrane is not erythematous, not retracted and not bulging.  Left Ear: Tympanic membrane, external ear and ear canal normal. Tympanic membrane is not erythematous, not retracted and not bulging.  Nose: Nose normal. Right sinus exhibits no maxillary sinus tenderness and no frontal sinus tenderness. Left sinus exhibits no maxillary sinus tenderness and no frontal sinus tenderness.  Mouth/Throat: Uvula is midline and mucous membranes are normal. Posterior oropharyngeal edema and posterior oropharyngeal erythema present. Tonsils are 2+ on the right. Tonsils are 2+ on the left. Tonsillar exudate.  Eyes: Conjunctivae and lids are normal.  Neck: Trachea normal.  Cardiovascular: Regular rhythm, S1 normal, S2 normal and normal heart sounds.  Tachycardia present.   Pulmonary/Chest: Effort normal and breath sounds normal. She has no decreased breath sounds. She has no wheezes. She has no rhonchi. She has no rales.  Lymphadenopathy:    She has no cervical adenopathy.  Neurological: She is alert.  Skin: Skin is warm, dry and intact.  Psychiatric: She has a normal mood and affect. Her speech is normal and behavior is normal.  Nursing note and vitals reviewed.   Results for orders placed or performed in visit on 10/04/16  POCT rapid strep A  Result Value Ref Range   Rapid Strep A Screen Negative Negative    Assessment and Plan:    Tami Foster was seen today for sore throat and left ear pain.  Diagnoses and all orders for this visit:  Exudative tonsillitis -     POCT rapid strep A  Tobacco abuse  Other orders -     amoxicillin (AMOXIL) 875 MG tablet; Take 1 tablet  (875 mg total) by mouth 2 (two) times daily.   Patient is a current smoker and given appearance of tonsils and severity of symptoms, will treat for secondary bacteria infection with antibiotic per orders. I also advised patient to push fluids -- until urine is clear ideally. Also, I asked her to resume her daily antihistamine and flonase. I encouraged her to let us know if symptoms persist despite treatment or if they worsen in any way.   . Reviewed expectations re: course of current medical issues. . Discussed self-management of symptoms. . Outlined signs and symptoms indicating need for more acute intervention. . Patient verbalized understanding and all questions were answered. . See orders for this visit as documented in the electronic medical record. . Patient received an After-Visit Summary.  CMA or LPN served as scribe during this visit. History, Physical, and Plan performed by medical provider. Documentation and orders reviewed and attested to.  Jarold MottoSamantha Shion Bluestein, PA-C

## 2016-10-17 ENCOUNTER — Telehealth: Payer: Self-pay | Admitting: Family Medicine

## 2016-10-17 NOTE — Telephone Encounter (Signed)
Patient reports continued sore throat, no more white patches. Has 2 antibiotic pills (amoxicillin) remaining. Has been alternating ibuprofen/tylenol, pain worse at night, feels drainage down back. Able to eat and drink. Taking Zyrtec, Benadryl at bedtime and using Flonase. No fever. No cough. Tonsils looks a little red but not swollen to her. Pain more on left side. Discussed continued symptomatic relief measures, can add Afrin nasal spray for up to 4 days. Warm salt water gargles, follow up in office/urgnet care/ED if worsening symptoms or if no improvement with above measures.

## 2016-10-21 ENCOUNTER — Other Ambulatory Visit: Payer: Self-pay | Admitting: Family Medicine

## 2016-10-21 DIAGNOSIS — F4323 Adjustment disorder with mixed anxiety and depressed mood: Secondary | ICD-10-CM

## 2016-10-22 ENCOUNTER — Other Ambulatory Visit: Payer: Self-pay | Admitting: Family Medicine

## 2016-10-22 ENCOUNTER — Encounter: Payer: Self-pay | Admitting: Family Medicine

## 2016-10-22 DIAGNOSIS — F4323 Adjustment disorder with mixed anxiety and depressed mood: Secondary | ICD-10-CM

## 2016-10-24 ENCOUNTER — Ambulatory Visit (INDEPENDENT_AMBULATORY_CARE_PROVIDER_SITE_OTHER): Payer: Commercial Managed Care - PPO | Admitting: Family Medicine

## 2016-10-24 ENCOUNTER — Encounter: Payer: Self-pay | Admitting: Family Medicine

## 2016-10-24 VITALS — BP 142/98 | HR 100 | Temp 98.7°F | Wt 163.0 lb

## 2016-10-24 DIAGNOSIS — J329 Chronic sinusitis, unspecified: Secondary | ICD-10-CM

## 2016-10-24 DIAGNOSIS — F4323 Adjustment disorder with mixed anxiety and depressed mood: Secondary | ICD-10-CM | POA: Diagnosis not present

## 2016-10-24 DIAGNOSIS — I10 Essential (primary) hypertension: Secondary | ICD-10-CM

## 2016-10-24 DIAGNOSIS — B9689 Other specified bacterial agents as the cause of diseases classified elsewhere: Secondary | ICD-10-CM

## 2016-10-24 MED ORDER — ALPRAZOLAM 1 MG PO TABS: ORAL_TABLET | ORAL | 0 refills | Status: DC

## 2016-10-24 MED ORDER — METOPROLOL SUCCINATE ER 50 MG PO TB24: 50.0000 mg | ORAL_TABLET | Freq: Every day | ORAL | 1 refills | Status: DC

## 2016-10-24 MED ORDER — AZITHROMYCIN 250 MG PO TABS: ORAL_TABLET | ORAL | 0 refills | Status: DC

## 2016-10-24 NOTE — Progress Notes (Signed)
Subjective:    Patient ID: Tami Foster, female    DOB: 15-Jan-1981, 36 y.o.   MRN: [id number]  HPI This is a 36 yo female who presents today with continued cough and sore throat. Was seen 10/04/16 and treated with amoxicillin for exudative tonsillitis. She continues to have sore throat daily and some drainage from right eye in the mornings. Cough a little better, mostly in am. Taking ibuprofen and tylenol, zyrtec, flonase, benadryl. Some diarrhea, thinks this is related to all meds. Feels tired but able to start new job last week. Sleeping ok. Feels congested in mornings. No rhinorrhea, feels like post nasal drip. + smoker, has been cutting back.   Requests refill of alprazolam and metoprolol.   Past Medical History:  Diagnosis Date  . Abnormal Pap smear   . Anxiety   . GERD (gastroesophageal reflux disease)   . Hypertension    Past Surgical History:  Procedure Laterality Date  . LEEP    . PERIURETHRAL ABSCESS DRAINAGE     abscess removed from tonsils. Tonsils not removed  . WISDOM TOOTH EXTRACTION     Family History  Problem Relation Age of Onset  . Cancer Mother        bladder  . Hypertension Mother   . Emphysema Father   . Hypertension Father   . Diabetes Paternal Grandmother   . Other Neg Hx    Social History  Substance Use Topics  . Smoking status: Current Every Day Smoker    Packs/day: 0.50    Years: 14.00    Types: Cigarettes  . Smokeless tobacco: Never Used  . Alcohol use 0.0 oz/week     Comment: Rare       Review of Systems Per HPI    Objective:   Physical Exam  Constitutional: She is oriented to person, place, and time. She appears well-developed and well-nourished. No distress.  HENT:  Head: Normocephalic and atraumatic.  Right Ear: External ear and ear canal normal.  Left Ear: External ear and ear canal normal.  Nose: Mucosal edema and rhinorrhea present. Right sinus exhibits maxillary sinus tenderness. Right sinus exhibits no frontal sinus  tenderness. Left sinus exhibits maxillary sinus tenderness. Left sinus exhibits no frontal sinus tenderness.  Mouth/Throat: Uvula is midline and mucous membranes are normal. Posterior oropharyngeal erythema (mild) present. No oropharyngeal exudate or posterior oropharyngeal edema.  Bilateral TMs dull   Eyes: Conjunctivae are normal.  Neck: Normal range of motion. Neck supple.  Cardiovascular: Normal rate, regular rhythm and normal heart sounds.   Pulmonary/Chest: Effort normal and breath sounds normal.  Lymphadenopathy:    She has no cervical adenopathy.  Neurological: She is alert and oriented to person, place, and time.  Skin: Skin is warm and dry. She is not diaphoretic.  Psychiatric: She has a normal mood and affect. Her behavior is normal. Judgment and thought content normal.         BP (!) 142/98 (BP Location: Right Arm, Patient Position: Sitting, Cuff Size: Normal)   Pulse 100   Temp 98.7 F (37.1 C) (Oral)   Wt 163 lb (73.9 kg)   SpO2 97%   BMI 30.80 kg/m  Wt Readings from Last 3 Encounters:  10/24/16 163 lb (73.9 kg)  10/04/16 166 lb 8 oz (75.5 kg)  08/29/16 165 lb 6.4 oz (75 kg)    Assessment & Plan:  1. Bacterial sinusitis - given persistence of symptoms and smoking history, will cover for atypical bacteria - Provided written and verbal information  regarding diagnosis and treatment. - azithromycin (ZITHROMAX) 250 MG tablet; Take 2 tabs PO x 1 dose, then 1 tab PO QD x 4 days  Dispense: 6 tablet; Refill: 0  2. Adjustment disorder with mixed anxiety and depressed mood - ALPRAZolam (XANAX) 1 MG tablet; Take 1/2 to 1 tablet twice a day as needed for anxiety  Dispense: 30 tablet; Refill: 0 - metoprolol succinate (TOPROL-XL) 50 MG 24 hr tablet; Take 1 tablet (50 mg total) by mouth daily.  Dispense: 90 tablet; Refill: 1  3. Essential hypertension - mildly elevated today, did not take metoprolol last night - metoprolol succinate (TOPROL-XL) 50 MG 24 hr tablet; Take 1  tablet (50 mg total) by mouth daily.  Dispense: 90 tablet; Refill: 1   Olean Ree, FNP-BC  [facility] [place], [facility]  10/24/2016 12:39 PM

## 2016-10-24 NOTE — Patient Instructions (Signed)

## 2016-10-25 ENCOUNTER — Encounter: Payer: Self-pay | Admitting: Family Medicine

## 2016-10-26 ENCOUNTER — Encounter: Payer: Self-pay | Admitting: Emergency Medicine

## 2016-10-26 ENCOUNTER — Encounter: Payer: Self-pay | Admitting: Family Medicine

## 2016-10-26 ENCOUNTER — Encounter: Payer: Self-pay | Admitting: Gynecology

## 2016-10-26 NOTE — Progress Notes (Signed)
Provided work note

## 2016-10-26 NOTE — Progress Notes (Signed)
Correction of previous letter per patient request.

## 2016-11-20 ENCOUNTER — Encounter: Payer: Self-pay | Admitting: Family Medicine

## 2016-11-21 ENCOUNTER — Other Ambulatory Visit: Payer: Self-pay | Admitting: Family Medicine

## 2016-11-21 MED ORDER — ETONOGESTREL-ETHINYL ESTRADIOL 0.12-0.015 MG/24HR VA RING: 1.0000 | VAGINAL_RING | VAGINAL | 2 refills | Status: DC

## 2016-12-01 ENCOUNTER — Other Ambulatory Visit: Payer: Self-pay

## 2016-12-01 MED ORDER — ETONOGESTREL-ETHINYL ESTRADIOL 0.12-0.015 MG/24HR VA RING: 1.0000 | VAGINAL_RING | VAGINAL | 0 refills | Status: DC

## 2016-12-07 ENCOUNTER — Telehealth: Payer: Self-pay | Admitting: Family Medicine

## 2016-12-07 NOTE — Telephone Encounter (Signed)
Patient called to inquire about a bill she received from collections for her 07/20/16 visit. I advised the patient that from the view we have as non billing it shows that [facility] resent the claim to her insurance [employer] on 11/29/16 and [facility] a [facility] on 12/01/16. It also showed that she was not in collections. I gave the patient the number for [facility] for additional questions and advised her to call her insurance as well.

## 2016-12-15 ENCOUNTER — Encounter: Payer: Self-pay | Admitting: Family Medicine

## 2016-12-16 ENCOUNTER — Encounter: Payer: Self-pay | Admitting: Family Medicine

## 2016-12-16 ENCOUNTER — Ambulatory Visit (INDEPENDENT_AMBULATORY_CARE_PROVIDER_SITE_OTHER): Payer: Commercial Managed Care - PPO | Admitting: Family Medicine

## 2016-12-16 VITALS — BP 128/84 | HR 86 | Ht 61.0 in | Wt 161.0 lb

## 2016-12-16 DIAGNOSIS — R61 Generalized hyperhidrosis: Secondary | ICD-10-CM

## 2016-12-16 DIAGNOSIS — R519 Headache, unspecified: Secondary | ICD-10-CM

## 2016-12-16 DIAGNOSIS — R112 Nausea with vomiting, unspecified: Secondary | ICD-10-CM

## 2016-12-16 DIAGNOSIS — R51 Headache: Secondary | ICD-10-CM

## 2016-12-16 DIAGNOSIS — J0100 Acute maxillary sinusitis, unspecified: Secondary | ICD-10-CM

## 2016-12-16 MED ORDER — AMOXICILLIN-POT CLAVULANATE 875-125 MG PO TABS: 1.0000 | ORAL_TABLET | Freq: Two times a day (BID) | ORAL | 0 refills | Status: DC

## 2016-12-16 MED ORDER — ONDANSETRON 8 MG PO TBDP: 8.0000 mg | ORAL_TABLET | Freq: Three times a day (TID) | ORAL | 0 refills | Status: DC | PRN

## 2016-12-16 NOTE — Patient Instructions (Signed)
Take daily antihistamine  Use flonase daily  Try Afrin nasal spray twice a day for up to 4 days, use before flonase  Let me know if not better in 3-5 days  Consider scheduling eye exam  If you get worse over weekend- go to ED/urgent care

## 2016-12-16 NOTE — Progress Notes (Signed)
Subjective:    Patient ID: Tami Foster, female    DOB: 22-Mar-1981, 36 y.o.   MRN: [id number]  HPI This is a 36 yo female who presents today with headaches behind right eye for about 3 weeks. Has had some intermittent nausea that she thinks if from the headaches. Has had intermittent headaches before, but not as constant. Some relief with tylenol or ibuprofen. Headache goes away, doesn't bother her at night, but comes on during day. No visual changes, no recent eye exam. Yesterday had nausea and 1 episode of vomiting. Has been seeing some "dots" in her eye. Some relief with counter pressure. Always has nasal drainage but has noticed significantly more post nasal drainage recently, worse at night, causes coughing. No SOB or wheeze. Takes claritin and flonase PRN. Currently feels a headache coming on and feels a little woozy. Has decreased tobacco. No recent fevers.   Has not been taking metfomin and wellbutrin regularly. With new job, forgets morning meds. No side effects from skipped doses.   Has had many years of excessive sweating. Has been told it is related to her PCOS. Has generalized sweating.   Past Medical History:  Diagnosis Date  . Abnormal Pap smear   . Anxiety   . GERD (gastroesophageal reflux disease)   . Hypertension    Past Surgical History:  Procedure Laterality Date  . LEEP    . PERIURETHRAL ABSCESS DRAINAGE     abscess removed from tonsils. Tonsils not removed  . WISDOM TOOTH EXTRACTION     Family History  Problem Relation Age of Onset  . Cancer Mother        bladder  . Hypertension Mother   . Emphysema Father   . Hypertension Father   . Diabetes Paternal Grandmother   . Other Neg Hx    Social History  Substance Use Topics  . Smoking status: Current Every Day Smoker    Packs/day: 0.50    Years: 14.00    Types: Cigarettes  . Smokeless tobacco: Never Used  . Alcohol use 0.0 oz/week     Comment: Rare      Review of Systems Per HPI    Objective:     Physical Exam  Constitutional: She is oriented to person, place, and time. She appears well-developed and well-nourished. No distress.  HENT:  Head: Normocephalic and atraumatic.  Right Ear: Tympanic membrane, external ear and ear canal normal.  Left Ear: Tympanic membrane, external ear and ear canal normal.  Nose: Septal deviation present. Right sinus exhibits maxillary sinus tenderness. Right sinus exhibits no frontal sinus tenderness. Left sinus exhibits no maxillary sinus tenderness and no frontal sinus tenderness.  Mouth/Throat: Uvula is midline and mucous membranes are normal. Posterior oropharyngeal erythema (mild with visible post nasal drainage) present. No oropharyngeal exudate, posterior oropharyngeal edema or tonsillar abscesses.  Eyes: Pupils are equal, round, and reactive to light. Conjunctivae and EOM are normal.  Right eyelid mildly puffy.   Neck: Normal range of motion. Neck supple.  Cardiovascular: Normal rate, regular rhythm and normal heart sounds.   Pulmonary/Chest: Effort normal and breath sounds normal.  Lymphadenopathy:    She has no cervical adenopathy.  Neurological: She is alert and oriented to person, place, and time.  Skin: Skin is warm and dry. She is not diaphoretic.  Psychiatric: She has a normal mood and affect. Her behavior is normal. Judgment and thought content normal.  Vitals reviewed.    BP 128/84   Pulse 86   Ht 5'  1" (1.549 m)   Wt 161 lb (73 kg)   SpO2 97%   BMI 30.42 kg/m  Wt Readings from Last 3 Encounters:  12/16/16 161 lb (73 kg)  10/24/16 163 lb (73.9 kg)  10/04/16 166 lb 8 oz (75.5 kg)      Assessment & Plan:  1. Acute non-recurrent maxillary sinusitis - continue Claritin and Flonase, can use short term Afrin - amoxicillin-clavulanate (AUGMENTIN) 875-125 MG tablet; Take 1 tablet by mouth 2 (two) times daily.  Dispense: 14 tablet; Refill: 0  2. Nonintractable episodic headache, unspecified headache type - suspect related to #1,  discussed RTC/ED precautions - suggested annual eye exam  3. Nausea and vomiting, intractability of vomiting not specified, unspecified vomiting type - likely related to #1, 2- post nasal drainage and headache - bland diet as tolerated - ondansetron (ZOFRAN-ODT) 8 MG disintegrating tablet; Take 1 tablet (8 mg total) by mouth every 8 (eight) hours as needed for nausea.  Dispense: 12 tablet; Refill: 0  4. Excessive sweating - not sure if related to PCOS, encouraged her to resume metformin and monitor for improvement - discussed comfort measures, cool shower, natural fibers   Olean Ree, FNP-BC  [facility], [facility]  12/16/2016 2:19 PM

## 2016-12-21 ENCOUNTER — Other Ambulatory Visit: Payer: Self-pay | Admitting: Family Medicine

## 2016-12-21 DIAGNOSIS — F4323 Adjustment disorder with mixed anxiety and depressed mood: Secondary | ICD-10-CM

## 2016-12-22 ENCOUNTER — Other Ambulatory Visit: Payer: Self-pay | Admitting: Family Medicine

## 2016-12-22 DIAGNOSIS — F4323 Adjustment disorder with mixed anxiety and depressed mood: Secondary | ICD-10-CM

## 2016-12-23 MED ORDER — ALPRAZOLAM 1 MG PO TABS: ORAL_TABLET | ORAL | 0 refills | Status: DC

## 2017-01-16 ENCOUNTER — Other Ambulatory Visit: Payer: Self-pay | Admitting: Family Medicine

## 2017-01-16 DIAGNOSIS — F4323 Adjustment disorder with mixed anxiety and depressed mood: Secondary | ICD-10-CM

## 2017-01-17 ENCOUNTER — Other Ambulatory Visit: Payer: Self-pay | Admitting: Family Medicine

## 2017-01-17 DIAGNOSIS — F4323 Adjustment disorder with mixed anxiety and depressed mood: Secondary | ICD-10-CM

## 2017-01-18 ENCOUNTER — Other Ambulatory Visit: Payer: Self-pay | Admitting: Emergency Medicine

## 2017-01-18 DIAGNOSIS — F4323 Adjustment disorder with mixed anxiety and depressed mood: Secondary | ICD-10-CM

## 2017-01-18 MED ORDER — ALPRAZOLAM 1 MG PO TABS: ORAL_TABLET | ORAL | 0 refills | Status: DC

## 2017-02-08 ENCOUNTER — Ambulatory Visit: Payer: Self-pay | Admitting: Family Medicine

## 2017-02-19 ENCOUNTER — Other Ambulatory Visit: Payer: Self-pay | Admitting: Family Medicine

## 2017-02-19 DIAGNOSIS — F4323 Adjustment disorder with mixed anxiety and depressed mood: Secondary | ICD-10-CM

## 2017-02-20 ENCOUNTER — Telehealth: Payer: Self-pay | Admitting: Family Medicine

## 2017-02-20 DIAGNOSIS — F4323 Adjustment disorder with mixed anxiety and depressed mood: Secondary | ICD-10-CM

## 2017-02-21 ENCOUNTER — Other Ambulatory Visit: Payer: Self-pay | Admitting: Family Medicine

## 2017-02-21 DIAGNOSIS — F4323 Adjustment disorder with mixed anxiety and depressed mood: Secondary | ICD-10-CM

## 2017-02-22 MED ORDER — ALPRAZOLAM 1 MG PO TABS: ORAL_TABLET | ORAL | 0 refills | Status: DC

## 2017-02-22 NOTE — Telephone Encounter (Signed)
Please phone in to patient's pharmacy.

## 2017-02-23 MED ORDER — ALPRAZOLAM 1 MG PO TABS: ORAL_TABLET | ORAL | 0 refills | Status: DC

## 2017-02-23 NOTE — Telephone Encounter (Signed)
[facility] [address] called to ck on alprazolam rx that was called there wanting to know if should have been called [facility] [address]?Please advise.

## 2017-02-23 NOTE — Telephone Encounter (Signed)
I had approved prescription, but patient reported it was not at pharmacy. I called in [facility] 1 mg, [facility] [address]

## 2017-02-24 MED ORDER — ALPRAZOLAM 1 MG PO TABS: ORAL_TABLET | ORAL | 0 refills | Status: DC

## 2017-02-24 NOTE — Telephone Encounter (Signed)
Prescription sent to pharmacy.

## 2017-02-24 NOTE — Telephone Encounter (Signed)
Please cancel [address] RX and [facility] [place]

## 2017-03-08 ENCOUNTER — Encounter: Payer: Self-pay | Admitting: Family Medicine

## 2017-03-08 ENCOUNTER — Ambulatory Visit (INDEPENDENT_AMBULATORY_CARE_PROVIDER_SITE_OTHER): Payer: Commercial Managed Care - PPO | Admitting: Family Medicine

## 2017-03-08 VITALS — BP 124/76 | HR 96 | Temp 98.3°F | Wt 164.0 lb

## 2017-03-08 DIAGNOSIS — G8929 Other chronic pain: Secondary | ICD-10-CM

## 2017-03-08 DIAGNOSIS — R1030 Lower abdominal pain, unspecified: Secondary | ICD-10-CM | POA: Diagnosis not present

## 2017-03-08 DIAGNOSIS — E669 Obesity, unspecified: Secondary | ICD-10-CM | POA: Diagnosis not present

## 2017-03-08 DIAGNOSIS — M545 Low back pain: Secondary | ICD-10-CM

## 2017-03-08 MED ORDER — MELOXICAM 15 MG PO TABS: 15.0000 mg | ORAL_TABLET | Freq: Every day | ORAL | 1 refills | Status: DC | PRN

## 2017-03-08 MED ORDER — CYCLOBENZAPRINE HCL 10 MG PO TABS: 5.0000 mg | ORAL_TABLET | Freq: Every evening | ORAL | 0 refills | Status: DC | PRN

## 2017-03-08 NOTE — Patient Instructions (Addendum)

## 2017-03-08 NOTE — Progress Notes (Signed)
Subjective:    Patient ID: Tami Foster, female    DOB: 1981/03/15, 36 y.o.   MRN: [id number]  HPI This is a 36 yo female who presents today with back and pelvic pain x 1 month. Back pain is fairly typical for her, just more frequent. 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 Pain after her shift. Some relief with ibuprofen 600 mg BID and forward flexion.Tries to stretch every morning. No radiation down legs, does have some right sided arm tingling occasionally. No fecal or urinary incontinence. No weakness. More stiff and sore after sitting.  "Pelvic pain," is described as bilateral lower abdominal pain/ pulling with coughing only. Very sharp.  Not currently using Nuvaring. Not actively trying to get pregnant, will probably resume use since she had problems with IUD and is a smoker so OCPs not advisable.   Obesity- frustrated by inability to lose weight. 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 Goes long stretches without eating.   Past Medical History:  Diagnosis Date  . Abnormal Pap smear   . Anxiety   . GERD (gastroesophageal reflux disease)   . Hypertension    Past Surgical History:  Procedure Laterality Date  . LEEP    . PERIURETHRAL ABSCESS DRAINAGE     abscess removed from tonsils. Tonsils not removed  . WISDOM TOOTH EXTRACTION     Family History  Problem Relation Age of Onset  . Cancer Mother        bladder  . Hypertension Mother   . Emphysema Father   . Hypertension Father   . Diabetes Paternal Grandmother   . Other Neg Hx    Social History  Substance Use Topics  . Smoking status: Current Every Day Smoker    Packs/day: 0.50    Years: 14.00    Types: Cigarettes  . Smokeless tobacco: Never Used  . Alcohol use 0.0 oz/week     Comment: Rare      Review of Systems Per HPI    Objective:   Physical Exam  Constitutional: She is  oriented to person, place, and time. She appears well-developed and well-nourished.  HENT:  Head: Normocephalic and atraumatic.  Eyes: Conjunctivae are normal.  Neck: Normal range of motion. Neck supple.  Cardiovascular: Normal rate, regular rhythm and normal heart sounds.   Pulmonary/Chest: Effort normal and breath sounds normal.  Abdominal: Soft. Bowel sounds are normal. She exhibits no distension. There is no tenderness. There is no rebound and no guarding.  Musculoskeletal: Normal range of motion.       Cervical back: She exhibits tenderness (right trapzius). She exhibits no bony tenderness.       Thoracic back: Normal.       Lumbar back: She exhibits tenderness (paraspinals). She exhibits normal range of motion and no bony tenderness.  Lymphadenopathy:    She has no cervical adenopathy.  Neurological: She is alert and oriented to person, place, and time.  Vitals reviewed.     BP 124/76 (BP Location: Right Arm, Patient Position: Sitting, Cuff Size: Normal)   Pulse 96   Temp 98.3 F (36.8 C) (Oral)   Wt 164 lb (74.4 kg)   SpO2 97%   BMI 30.99 kg/m  Wt Readings from Last 3 Encounters:  03/08/17 164 lb (74.4 kg)  12/16/16 161 lb (73 kg)  10/24/16 163 lb (73.9 kg)  Assessment & Plan:  1. Chronic bilateral low back pain without sciatica - Provided written and verbal information regarding diagnosis and treatment. - provided stretches for her to do - meloxicam (MOBIC) 15 MG tablet; Take 1 tablet (15 mg total) by mouth daily as needed for pain.  Dispense: 30 tablet; Refill: 1 - cyclobenzaprine (FLEXERIL) 10 MG tablet; Take 0.5-1 tablets (5-10 mg total) by mouth at bedtime as needed for muscle spasms.  Dispense: 30 tablet; Refill: 0- she was cautioned not to take with alprazolam - RTC precautions  2. Lower abdominal pain - suspect muscle strain as this occurs with cough - encouraged her to brace area when she coughs  3. Obesity, Class I, BMI 30-34.9 - discussed healthy  food choices with her busy/stressful life, importance of self care, regular sleep   Olean Reeeborah Jd Mccaster, FNP-BC  [facility], [facility]  03/11/2017 12:31 PM

## 2017-03-28 ENCOUNTER — Other Ambulatory Visit: Payer: Self-pay | Admitting: Family Medicine

## 2017-03-28 DIAGNOSIS — F4323 Adjustment disorder with mixed anxiety and depressed mood: Secondary | ICD-10-CM

## 2017-03-29 NOTE — Telephone Encounter (Signed)
Forwarding.Marland Kitchen.Marland Kitchen.Marland Kitchen.Debbie's patient.

## 2017-04-03 ENCOUNTER — Other Ambulatory Visit: Payer: Self-pay | Admitting: Family Medicine

## 2017-04-03 DIAGNOSIS — F4323 Adjustment disorder with mixed anxiety and depressed mood: Secondary | ICD-10-CM

## 2017-04-03 MED ORDER — ALPRAZOLAM 1 MG PO TABS: ORAL_TABLET | ORAL | 1 refills | Status: DC

## 2017-04-03 NOTE — Telephone Encounter (Signed)
Okay to refill? 

## 2017-04-25 ENCOUNTER — Other Ambulatory Visit: Payer: Self-pay | Admitting: Family Medicine

## 2017-04-25 DIAGNOSIS — G8929 Other chronic pain: Secondary | ICD-10-CM

## 2017-04-25 DIAGNOSIS — M545 Low back pain: Principal | ICD-10-CM

## 2017-04-27 MED ORDER — CYCLOBENZAPRINE HCL 10 MG PO TABS: 5.0000 mg | ORAL_TABLET | Freq: Every evening | ORAL | 0 refills | Status: DC | PRN

## 2017-05-15 ENCOUNTER — Other Ambulatory Visit: Payer: Self-pay | Admitting: Family Medicine

## 2017-05-15 ENCOUNTER — Encounter: Payer: Self-pay | Admitting: Family Medicine

## 2017-05-15 DIAGNOSIS — I10 Essential (primary) hypertension: Secondary | ICD-10-CM

## 2017-05-15 DIAGNOSIS — F4323 Adjustment disorder with mixed anxiety and depressed mood: Secondary | ICD-10-CM

## 2017-05-15 MED ORDER — METOPROLOL SUCCINATE ER 50 MG PO TB24: 50.0000 mg | ORAL_TABLET | Freq: Every day | ORAL | 1 refills | Status: DC

## 2017-05-26 ENCOUNTER — Encounter: Payer: Self-pay | Admitting: Family Medicine

## 2017-05-26 DIAGNOSIS — F4323 Adjustment disorder with mixed anxiety and depressed mood: Secondary | ICD-10-CM

## 2017-05-26 MED ORDER — ALPRAZOLAM 1 MG PO TABS: ORAL_TABLET | ORAL | 1 refills | Status: DC

## 2017-05-26 NOTE — Telephone Encounter (Signed)
Please call in to patient's pharmacy.

## 2017-05-26 NOTE — Telephone Encounter (Signed)
Last rx 04/03/2017 #30 1R. Last OV 02/2017

## 2017-05-29 NOTE — Telephone Encounter (Signed)
Rx called in to requested pharmacy 

## 2017-06-26 ENCOUNTER — Ambulatory Visit: Payer: 59 | Admitting: Family Medicine

## 2017-06-26 ENCOUNTER — Encounter: Payer: Self-pay | Admitting: Family Medicine

## 2017-06-26 DIAGNOSIS — F4323 Adjustment disorder with mixed anxiety and depressed mood: Secondary | ICD-10-CM | POA: Diagnosis not present

## 2017-06-26 DIAGNOSIS — J069 Acute upper respiratory infection, unspecified: Secondary | ICD-10-CM

## 2017-06-26 MED ORDER — ALBUTEROL SULFATE HFA 108 (90 BASE) MCG/ACT IN AERS
2.0000 | INHALATION_SPRAY | RESPIRATORY_TRACT | 1 refills | Status: DC | PRN
Start: 2017-06-26 — End: 2020-12-16

## 2017-06-26 MED ORDER — ALBUTEROL SULFATE HFA 108 (90 BASE) MCG/ACT IN AERS
2.0000 | INHALATION_SPRAY | RESPIRATORY_TRACT | 1 refills | Status: DC | PRN
Start: 2017-06-26 — End: 2017-06-26

## 2017-06-26 MED ORDER — ALPRAZOLAM 1 MG PO TABS: ORAL_TABLET | ORAL | 2 refills | Status: DC

## 2017-06-26 NOTE — Patient Instructions (Signed)
Take pepcid or zantac (generic is fine) twice a day as needed for nausea Take a daily over the counter antihistamine as needed- generic claritin, zyrtec, etc.   For nasal congestion you can use Afrin nasal spray for 3 days max, Sudafed, saline nasal spray (generic is fine for all). Drink enough fluids to make your urine light yellow. For fever/chill/muscle aches you can take over the counter acetaminophen or ibuprofen.  Please come back in if you are not better in 5-7 days or if you develop wheezing, shortness of breath or persistent vomiting.

## 2017-06-26 NOTE — Progress Notes (Signed)
Subjective:    Patient ID: Tami SartoriusNatalie Foster, female    DOB: 1980-07-12, 37 y.o.   MRN: [id number]  HPI This is a 37 yo female, accompanied by her 324 yo son, who presents today with sore throat x1 day, cough, headache, nausea x 1 week. Did a televisit through her insurance last week and got some cough syrup and nose spray. No wheeze or SOB, occasional tightness, stopped smoking 2 weeks ago! Decreased appetite but able to eat. Has had some loose bowel movements. Negative home pregnancy test. Uses Nuvaring.    Past Medical History:  Diagnosis Date  . Abnormal Pap smear   . Anxiety   . GERD (gastroesophageal reflux disease)   . Hypertension    Past Surgical History:  Procedure Laterality Date  . LEEP    . PERIURETHRAL ABSCESS DRAINAGE     abscess removed from tonsils. Tonsils not removed  . WISDOM TOOTH EXTRACTION     Family History  Problem Relation Age of Onset  . Cancer Mother        bladder  . Hypertension Mother   . Emphysema Father   . Hypertension Father   . Diabetes Paternal Grandmother   . Other Neg Hx    Social History   Tobacco Use  . Smoking status: Current Every Day Smoker    Packs/day: 0.50    Years: 14.00    Pack years: 7.00    Types: Cigarettes  . Smokeless tobacco: Never Used  Substance Use Topics  . Alcohol use: Yes    Alcohol/week: 0.0 oz    Comment: Rare  . Drug use: No      Review of Systems Per HPI    Objective:   Physical Exam  Constitutional: She appears well-developed and well-nourished. No distress.  HENT:  Head: Normocephalic and atraumatic.  Right Ear: Tympanic membrane, external ear and ear canal normal.  Left Ear: Tympanic membrane, external ear and ear canal normal.  Nose: Mucosal edema and rhinorrhea present.  Mouth/Throat: Uvula is midline. Posterior oropharyngeal erythema present. No oropharyngeal exudate, posterior oropharyngeal edema or tonsillar abscesses.  Eyes: Conjunctivae are normal.  Neck: Normal range of motion.  Neck supple.  Cardiovascular: Normal rate, regular rhythm and normal heart sounds.  Pulmonary/Chest: Effort normal and breath sounds normal.  Lymphadenopathy:    She has cervical adenopathy.  Skin: She is not diaphoretic.  Vitals reviewed.     BP 124/66   Pulse 77   Temp 98.3 F (36.8 C) (Oral)   Wt 164 lb 8 oz (74.6 kg)   SpO2 96%   BMI 31.08 kg/m  Wt Readings from Last 3 Encounters:  06/26/17 164 lb 8 oz (74.6 kg)  03/08/17 164 lb (74.4 kg)  12/16/16 161 lb (73 kg)        Assessment & Plan:  1. Adjustment disorder with mixed anxiety and depressed mood - ALPRAZolam (XANAX) 1 MG tablet; Take 1/2 to 1 tablet twice a day as needed for anxiety  Dispense: 30 tablet; Refill: 2  2. Acute upper respiratory infection - likely viral, discussed symptomatic treatment - follow up if not improved in 5-7 days or if develops fever/productive cough - albuterol (PROVENTIL HFA;VENTOLIN HFA) 108 (90 Base) MCG/ACT inhaler; Inhale 2 puffs into the lungs every 4 (four) hours as needed for wheezing or shortness of breath (cough, shortness of breath or wheezing.).  Dispense: 1 Inhaler; Refill: 1   Olean Tami Kiwana Deblasi, FNP-BC  [facility], [facility]  06/26/2017  12:57 PM

## 2017-07-28 ENCOUNTER — Other Ambulatory Visit: Payer: Self-pay | Admitting: Family Medicine

## 2017-07-28 DIAGNOSIS — M545 Low back pain: Principal | ICD-10-CM

## 2017-07-28 DIAGNOSIS — G8929 Other chronic pain: Secondary | ICD-10-CM

## 2017-07-28 MED ORDER — CYCLOBENZAPRINE HCL 10 MG PO TABS: 5.0000 mg | ORAL_TABLET | Freq: Every evening | ORAL | 0 refills | Status: DC | PRN

## 2017-07-28 NOTE — Telephone Encounter (Signed)
Pt seen for back pain on 03/08/17 and flexeril last refilled # 30 on 04/27/17.Please advise.

## 2017-08-18 ENCOUNTER — Encounter: Payer: Self-pay | Admitting: Family Medicine

## 2017-08-18 ENCOUNTER — Ambulatory Visit: Payer: 59 | Admitting: Family Medicine

## 2017-08-18 VITALS — BP 136/82 | HR 99 | Temp 98.3°F | Wt 169.8 lb

## 2017-08-18 DIAGNOSIS — R030 Elevated blood-pressure reading, without diagnosis of hypertension: Secondary | ICD-10-CM | POA: Diagnosis not present

## 2017-08-18 DIAGNOSIS — R87610 Atypical squamous cells of undetermined significance on cytologic smear of cervix (ASC-US): Secondary | ICD-10-CM | POA: Diagnosis not present

## 2017-08-18 DIAGNOSIS — R002 Palpitations: Secondary | ICD-10-CM | POA: Diagnosis not present

## 2017-08-18 DIAGNOSIS — J069 Acute upper respiratory infection, unspecified: Secondary | ICD-10-CM | POA: Diagnosis not present

## 2017-08-18 NOTE — Patient Instructions (Addendum)
Good to see you today  Please check your blood pressure once a week for the next 4 weeks.   Schedule your PAP for 4 weeks.

## 2017-08-18 NOTE — Progress Notes (Signed)
Subjective:    Patient ID: Thornell SartoriusNatalie Cambron, female    DOB: 08-22-1980, 37 y.o.   MRN: [id number]  HPI Thornell Sartoriusatalie Cambron is a 37 y.o. female who presents today for increased heart rate. She noticed it Wednesday afternoon at work and noted it to be ~130s and remained elevated till taking her medication that night and had accompanying BP of 142/100. States it feel fluttery like an anxiety attack. Reports medication compliance taking it at night and normal HR being in 80s. Does report some SOB and fatigue but denies CP, fever, chills, nausea, or vomiting  Also complains, sore throat and headache with dry cough since Wednesday. Reports having a post nasal drip and waking up feeling worse in morning. Currently do a saline nasal rinse followed by her PRN Atrovent NS. Does report resp illness running throughout house.  Review of Systems  Constitutional: Negative for activity change.  HENT: Positive for congestion and rhinorrhea. Negative for ear pain, hearing loss and trouble swallowing.   Respiratory: Negative for chest tightness and wheezing.   Cardiovascular: Positive for palpitations. Negative for chest pain.      Past Medical History:  Diagnosis Date  . Abnormal Pap smear   . Anxiety   . GERD (gastroesophageal reflux disease)   . Hypertension    Past Surgical History:  Procedure Laterality Date  . LEEP    . PERIURETHRAL ABSCESS DRAINAGE     abscess removed from tonsils. Tonsils not removed  . WISDOM TOOTH EXTRACTION     Family History  Problem Relation Age of Onset  . Cancer Mother        bladder  . Hypertension Mother   . Emphysema Father   . Hypertension Father   . Diabetes Paternal Grandmother   . Other Neg Hx    Social History   Socioeconomic History  . Marital status: Married    Spouse name: Not on file  . Number of children: Not on file  . Years of education: Not on file  . Highest education level: Not on file  Occupational History  . Not on file  Social Needs  .  Financial resource strain: Not on file  . Food insecurity:    Worry: Not on file    Inability: Not on file  . Transportation needs:    Medical: Not on file    Non-medical: Not on file  Tobacco Use  . Smoking status: Current Every Day Smoker    Packs/day: 0.50    Years: 14.00    Pack years: 7.00    Types: Cigarettes  . Smokeless tobacco: Never Used  Substance and Sexual Activity  . Alcohol use: Yes    Alcohol/week: 0.0 oz    Comment: Rare  . Drug use: No  . Sexual activity: Yes    Partners: Male    Birth control/protection: Inserts    Comment: 1st intercourse- 19, partners- 6  Lifestyle  . Physical activity:    Days per week: Not on file    Minutes per session: Not on file  . Stress: Not on file  Relationships  . Social connections:    Talks on phone: Not on file    Gets together: Not on file    Attends religious service: Not on file    Active member of club or organization: Not on file    Attends meetings of clubs or organizations: Not on file    Relationship status: Not on file  . Intimate partner violence:    Fear  of current or ex partner: Not on file    Emotionally abused: Not on file    Physically abused: Not on file    Forced sexual activity: Not on file  Other Topics Concern  . Not on file  Social History Narrative  . Not on file   Current Outpatient Medications on File Prior to Visit  Medication Sig Dispense Refill  . albuterol (PROVENTIL HFA;VENTOLIN HFA) 108 (90 Base) MCG/ACT inhaler Inhale 2 puffs into the lungs every 4 (four) hours as needed for wheezing or shortness of breath (cough, shortness of breath or wheezing.). 1 Inhaler 1  . ALPRAZolam (XANAX) 1 MG tablet Take 1/2 to 1 tablet twice a day as needed for anxiety 30 tablet 2  . cyclobenzaprine (FLEXERIL) 10 MG tablet Take 0.5-1 tablets (5-10 mg total) by mouth at bedtime as needed for muscle spasms. 30 tablet 0  . ipratropium (ATROVENT) 0.06 % nasal spray INSTILL 2 SPRAYS IN EACH NOSTRIL 3-4 TIMES A  DAY AS NEEDED FOR RUNNY NOSE  0  . meloxicam (MOBIC) 15 MG tablet Take 1 tablet (15 mg total) by mouth daily as needed for pain. 30 tablet 1  . metFORMIN (GLUCOPHAGE) 500 MG tablet Take 1/2 tablet once a day x 3 days then take 1 tablet daily 90 tablet 1  . metoprolol succinate (TOPROL-XL) 50 MG 24 hr tablet Take 1 tablet (50 mg total) by mouth daily. 90 tablet 1  . ondansetron (ZOFRAN-ODT) 8 MG disintegrating tablet Take 1 tablet (8 mg total) by mouth every 8 (eight) hours as needed for nausea. 12 tablet 0   No current facility-administered medications on file prior to visit.     Objective:   Physical Exam  Constitutional: She appears well-nourished. No distress.  HENT:  Right Ear: External ear normal. Tympanic membrane is bulging.  Left Ear: External ear normal. Tympanic membrane is bulging.  Nose: Mucosal edema present. Right sinus exhibits maxillary sinus tenderness. Right sinus exhibits no frontal sinus tenderness. Left sinus exhibits no maxillary sinus tenderness and no frontal sinus tenderness.  Mouth/Throat: Mucous membranes are normal. Posterior oropharyngeal erythema present.  Neck: Normal range of motion. Neck supple.  Cardiovascular: Normal rate, regular rhythm and normal heart sounds. Exam reveals no gallop and no friction rub.  No murmur heard. Pulmonary/Chest: Effort normal and breath sounds normal. No respiratory distress. She has no wheezes.  Lymphadenopathy:    She has no cervical adenopathy.    BP 136/82   Pulse 99   Temp 98.3 F (36.8 C) (Oral)   Wt 169 lb 12 oz (77 kg)   SpO2 97%   BMI 32.07 kg/m       Assessment & Plan:  1. Viral URI - Stay Hydrated and rest as much as possible  2. Palpitations - Monitor home bp and HR. Switch to taking Toprol in AM instead of PM - EKG 12-Lead   Rebecka Apley, RN, Adult-Geriatric Nurse Practitioner Student

## 2017-08-18 NOTE — Progress Notes (Signed)
Subjective:    Patient ID: Tami Foster, female    DOB: Feb 13, 1981, 37 y.o.   MRN: [id number]  HPI This is a 37 yo female who presents today with recent hypertension. She is accompanied by her preschool aged son, Tami Foster. She works as an Museum/gallery exhibitions officer. Two days ago she was feeling poorly with runny nose, headache, cough, fatigue. Had a coworker check her blood pressure and it was 140/110, heart rate was 130 and she felt fluttery in her chest. Takes metoprolol XL 50 mg qhs. Has not missed any doses. Reports poor fluid intake. Has had a cup of coffee today only.  Children and husband sick with similar symptoms.  Had abnormal pap at gyn with ASCU-US, negative HPV 6/17 with one year recall. Has not had repeat.  She is in the process of applying for job at [employer].   Past Medical History:  Diagnosis Date  . Abnormal Pap smear   . Anxiety   . GERD (gastroesophageal reflux disease)   . Hypertension    Past Surgical History:  Procedure Laterality Date  . LEEP    . PERIURETHRAL ABSCESS DRAINAGE     abscess removed from tonsils. Tonsils not removed  . WISDOM TOOTH EXTRACTION     Family History  Problem Relation Age of Onset  . Cancer Mother        bladder  . Hypertension Mother   . Emphysema Father   . Hypertension Father   . Diabetes Paternal Grandmother   . Other Neg Hx    Social History   Tobacco Use  . Smoking status: Former Smoker    Packs/day: 0.50    Years: 14.00    Pack years: 7.00    Types: Cigarettes    Last attempt to quit: 06/09/2017    Years since quitting: 0.1  . Smokeless tobacco: Never Used  Substance Use Topics  . Alcohol use: Yes    Alcohol/week: 0.0 oz    Comment: Rare  . Drug use: No      Review of Systems Per HPI    Objective:   Physical Exam  Constitutional: She is oriented to person, place, and time. She appears well-developed and well-nourished. She appears ill. No distress.  HENT:  Head: Normocephalic and atraumatic.  Right Ear: External ear and  ear canal normal.  Left Ear: External ear and ear canal normal.  Nose: Mucosal edema and rhinorrhea present.  TMs dull.   Eyes: Conjunctivae are normal.  Neck: Normal range of motion. Neck supple.  Cardiovascular: Normal rate, regular rhythm and normal heart sounds.  Pulmonary/Chest: Effort normal and breath sounds normal.  Neurological: She is alert and oriented to person, place, and time.  Skin: Skin is warm and dry. She is not diaphoretic.  Psychiatric: She has a normal mood and affect. Her behavior is normal. Judgment and thought content normal.  Vitals reviewed.     BP 136/82   Pulse 99   Temp 98.3 F (36.8 C) (Oral)   Wt 169 lb 12 oz (77 kg)   SpO2 97%   BMI 32.07 kg/m  Wt Readings from Last 3 Encounters:  08/18/17 169 lb 12 oz (77 kg)  06/26/17 164 lb 8 oz (74.6 kg)  03/08/17 164 lb (74.4 kg)   EKG- SR, rate 97, PR 180, QT 362,  No prior EKG for comparison    Assessment & Plan:  1. Elevated blood pressure reading - isolated reading, she will continue to check readings outside of office and will  follow up in 1 month.   2. Viral URI - increase fluids, symptomatic treatment  3. Palpitations - likely related to mild dehydration, viral URI - EKG 12-Lead  4. Atypical squamous cells of undetermined significance on cytologic smear of cervix (ASC-US) - follow up for PAP   Olean Reeeborah Shyenne Maggard, FNP-BC  [facility], [facility]  08/20/2017 5:53 AM

## 2017-08-20 ENCOUNTER — Encounter: Payer: Self-pay | Admitting: Family Medicine

## 2017-09-22 ENCOUNTER — Other Ambulatory Visit (HOSPITAL_COMMUNITY)
Admission: RE | Admit: 2017-09-22 | Discharge: 2017-09-22 | Disposition: A | Discharge status: Home / Self Care | Payer: 59 | Source: Ambulatory Visit | Attending: Family Medicine | Admitting: Family Medicine

## 2017-09-22 ENCOUNTER — Encounter: Payer: Self-pay | Admitting: Family Medicine

## 2017-09-22 ENCOUNTER — Ambulatory Visit (INDEPENDENT_AMBULATORY_CARE_PROVIDER_SITE_OTHER): Payer: 59 | Admitting: Family Medicine

## 2017-09-22 VITALS — BP 110/70 | HR 95 | Temp 97.6°F | Ht 61.0 in | Wt 169.0 lb

## 2017-09-22 DIAGNOSIS — E669 Obesity, unspecified: Secondary | ICD-10-CM

## 2017-09-22 DIAGNOSIS — Z1151 Encounter for screening for human papillomavirus (HPV): Secondary | ICD-10-CM | POA: Insufficient documentation

## 2017-09-22 DIAGNOSIS — Z0000 Encounter for general adult medical examination without abnormal findings: Secondary | ICD-10-CM | POA: Diagnosis not present

## 2017-09-22 DIAGNOSIS — R030 Elevated blood-pressure reading, without diagnosis of hypertension: Secondary | ICD-10-CM | POA: Diagnosis not present

## 2017-09-22 DIAGNOSIS — F4323 Adjustment disorder with mixed anxiety and depressed mood: Secondary | ICD-10-CM

## 2017-09-22 DIAGNOSIS — R5382 Chronic fatigue, unspecified: Secondary | ICD-10-CM | POA: Diagnosis not present

## 2017-09-22 DIAGNOSIS — Z124 Encounter for screening for malignant neoplasm of cervix: Secondary | ICD-10-CM | POA: Diagnosis not present

## 2017-09-22 DIAGNOSIS — E559 Vitamin D deficiency, unspecified: Secondary | ICD-10-CM | POA: Diagnosis not present

## 2017-09-22 DIAGNOSIS — E66811 Obesity, class 1: Secondary | ICD-10-CM

## 2017-09-22 DIAGNOSIS — I10 Essential (primary) hypertension: Secondary | ICD-10-CM | POA: Diagnosis not present

## 2017-09-22 LAB — CBC WITH DIFFERENTIAL/PLATELET
BASOS ABS: 0 10*3/uL (ref 0.0–0.1)
Basophils Relative: 0.7 % (ref 0.0–3.0)
EOS ABS: 0.1 10*3/uL (ref 0.0–0.7)
Eosinophils Relative: 1.3 % (ref 0.0–5.0)
HEMATOCRIT: 43.9 % (ref 36.0–46.0)
Hemoglobin: 14.9 g/dL (ref 12.0–15.0)
LYMPHS PCT: 39.2 % (ref 12.0–46.0)
Lymphs Abs: 2.7 10*3/uL (ref 0.7–4.0)
MCHC: 33.8 g/dL (ref 30.0–36.0)
MCV: 92.5 fl (ref 78.0–100.0)
MONO ABS: 0.6 10*3/uL (ref 0.1–1.0)
Monocytes Relative: 9.1 % (ref 3.0–12.0)
Neutro Abs: 3.4 10*3/uL (ref 1.4–7.7)
Neutrophils Relative %: 49.7 % (ref 43.0–77.0)
PLATELETS: 210 10*3/uL (ref 150.0–400.0)
RBC: 4.75 Mil/uL (ref 3.87–5.11)
RDW: 13 % (ref 11.5–15.5)
WBC: 6.9 10*3/uL (ref 4.0–10.5)

## 2017-09-22 LAB — VITAMIN D 25 HYDROXY (VIT D DEFICIENCY, FRACTURES): VITD: 22.15 ng/mL — AB (ref 30.00–100.00)

## 2017-09-22 LAB — LIPID PANEL
Cholesterol: 172 mg/dL (ref 0–200)
HDL: 53.3 mg/dL (ref >39.00)
LDL Cholesterol: 103 mg/dL — ABNORMAL HIGH (ref 0–99)
NONHDL: 118.47
Total CHOL/HDL Ratio: 3
Triglycerides: 79 mg/dL (ref 0.0–149.0)
VLDL: 15.8 mg/dL (ref 0.0–40.0)

## 2017-09-22 LAB — COMPREHENSIVE METABOLIC PANEL
ALT: 24 U/L (ref 0–35)
AST: 22 U/L (ref 0–37)
Albumin: 4.2 g/dL (ref 3.5–5.2)
Alkaline Phosphatase: 81 U/L (ref 39–117)
BUN: 12 mg/dL (ref 6–23)
CO2: 26 mEq/L (ref 19–32)
Calcium: 9.4 mg/dL (ref 8.4–10.5)
Chloride: 105 mEq/L (ref 96–112)
Creatinine, Ser: 0.58 mg/dL (ref 0.40–1.20)
GFR: 126 mL/min (ref >60.00)
Glucose, Bld: 102 mg/dL — ABNORMAL HIGH (ref 70–99)
Potassium: 3.9 mEq/L (ref 3.5–5.1)
Sodium: 137 mEq/L (ref 135–145)
Total Bilirubin: 0.5 mg/dL (ref 0.2–1.2)
Total Protein: 7.4 g/dL (ref 6.0–8.3)

## 2017-09-22 LAB — FERRITIN: FERRITIN: 43.6 ng/mL (ref 10.0–291.0)

## 2017-09-22 LAB — TSH: TSH: 0.57 u[IU]/mL (ref 0.35–4.50)

## 2017-09-22 LAB — HEMOGLOBIN A1C: HEMOGLOBIN A1C: 5.5 % (ref 4.6–6.5)

## 2017-09-22 MED ORDER — ALPRAZOLAM 1 MG PO TABS: ORAL_TABLET | ORAL | 2 refills | Status: DC

## 2017-09-22 NOTE — Progress Notes (Signed)
Subjective:    Patient ID: Tami Foster, female    DOB: 07/06/1980, 37 y.o.   MRN: [id number]  HPI This is a 37 yo female who presents today for cpe with PAP. 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 Husband supportive, having difficulties with her teenage stepdaughter with depression and threatening suicide.   Past Medical History:  Diagnosis Date  . Abnormal Pap smear   . Anxiety   . GERD (gastroesophageal reflux disease)   . Hypertension    Past Surgical History:  Procedure Laterality Date  . LEEP    . PERIURETHRAL ABSCESS DRAINAGE     abscess removed from tonsils. Tonsils not removed  . WISDOM TOOTH EXTRACTION     Family History  Problem Relation Age of Onset  . Cancer Mother        bladder  . Hypertension Mother   . Emphysema Father   . Hypertension Father   . Diabetes Paternal Grandmother   . Other Neg Hx    Social History   Tobacco Use  . Smoking status: Current Every Day Smoker    Packs/day: 0.50    Years: 14.00    Pack years: 7.00    Types: Cigarettes    Last attempt to quit: 06/09/2017    Years since quitting: 0.2  . Smokeless tobacco: Never Used  Substance Use Topics  . Alcohol use: Yes    Alcohol/week: 0.0 oz    Comment: Rare  . Drug use: No      Review of Systems  Constitutional: Positive for fatigue (longstanding). Negative for unexpected weight change (can't lose weight).  HENT: Negative.   Eyes: Negative.   Respiratory: Negative.   Cardiovascular: Positive for palpitations (occasionally with anxiety).  Gastrointestinal: Positive for constipation (chronic).  Endocrine: Positive for heat intolerance (always sweats a lot, since teenager).  Genitourinary: Negative.   Musculoskeletal: Positive for back pain.  Skin: Negative.   Allergic/Immunologic: Negative.   Neurological: Positive for headaches (occasional).  Hematological: Negative.   Psychiatric/Behavioral: Positive for dysphoric mood and sleep disturbance (working on getting 66 yo  son out of her bed at night. ). The patient is nervous/anxious (takes alprazolam 1-2 x day, most days).        Objective:   Physical Exam Physical Exam  Constitutional: She is oriented to person, place, and time. She appears well-developed and well-nourished. No distress.  HENT:  Head: Normocephalic and atraumatic.  Right Ear: External ear normal.  Left Ear: External ear normal.  Nose: Nose normal.  Mouth/Throat: Oropharynx is clear and moist. No oropharyngeal exudate.  Eyes: Conjunctivae are normal. Pupils are equal, round, and reactive to light.  Neck: Normal range of motion. Neck supple. No JVD present. No thyromegaly present.  Cardiovascular: Normal rate, regular rhythm, normal heart sounds and intact distal pulses.   Pulmonary/Chest: Effort normal and breath sounds normal. Right breast exhibits no inverted nipple, no mass, no nipple discharge, no skin change and no tenderness. Left breast exhibits no inverted nipple, no mass, no nipple discharge, no skin change and no tenderness. Breasts are symmetrical.  Abdominal: Soft. Bowel sounds are normal. She exhibits no distension and no mass. There is no tenderness. There is no rebound and no guarding.  Genitourinary: Vagina normal. Pelvic exam was performed with patient supine. There is no rash, tenderness, lesion or injury on the right labia. There is no rash, tenderness, lesion or injury on the left labia. Cervix exhibits no discharge. No vaginal discharge found.  Musculoskeletal: Normal range of motion.  She exhibits no edema or tenderness.  Lymphadenopathy:    She has no cervical adenopathy.  Neurological: She is alert and oriented to person, place, and time. She has normal reflexes.  Skin: Skin is warm and dry. She is not diaphoretic.  Psychiatric: She has a normal mood and affect. Her behavior is normal. Judgment and thought content normal.  Vitals reviewed.     BP 110/70 (BP Location: Right Arm, Patient Position: Sitting, Cuff  Size: Normal)   Pulse 95   Temp 97.6 F (36.4 C) (Oral)   Ht  (1.549 m)   Wt 169 lb (76.7 kg)   SpO2 97%   BMI 31.93 kg/m  Wt Readings from Last 3 Encounters:  09/22/17 169 lb (76.7 kg)  08/18/17 169 lb 12 oz (77 kg)  06/26/17 164 lb 8 oz (74.6 kg)   Depression screen [facility] 2/9 09/22/2017 07/20/2016 10/13/2015 05/26/2015 01/19/2015  Decreased Interest 1 0 0 0 0  Down, Depressed, Hopeless 1 0 0 0 0  PHQ - 2 Score 2 0 0 0 0  Altered sleeping 1 - - - -  Tired, decreased energy 2 - - - -  Change in appetite 0 - - - -  Feeling bad or failure about yourself  0 - - - -  Trouble concentrating 1 - - - -  Moving slowly or fidgety/restless 0 - - - -  Suicidal thoughts 0 - - - -  PHQ-9 Score 6 - - - -       Assessment & Plan:  1. Annual physical exam - Discussed and encouraged healthy lifestyle choices- adequate sleep, regular exercise, stress management and healthy food choices.    2. Screening for cervical cancer - Cytology - PAP([place])  3. Adjustment disorder with mixed anxiety and depressed mood - stopped taking venlafaxine, has previously been on sertraline, bupropion, duloxetine. Saw psych, did not find helpful. Not interested in counseling at this time.  - encouraged regular exercise on her days off, eating mostly whole food/unprocessed foods, yoga/meditation - ALPRAZolam (XANAX) 1 MG tablet; Take 1/2 to 1 tablet twice a day as needed for anxiety  Dispense: 30 tablet; Refill: 2  4. Elevated blood pressure reading - Lipid Panel  5. Essential hypertension - well controlled today - TSH  6. Obesity, Class I, BMI 30-34.9 - TSH - Hemoglobin A1c - Lipid Panel  7. Chronic fatigue - TSH - Comprehensive metabolic panel - CBC with Differential - Ferritin  8. Vitamin D deficiency - Vitamin D, 25-hydroxy   Olean Ree, FNP-BC  [facility], [facility]  10/08/2017 10:29 AM

## 2017-09-22 NOTE — Patient Instructions (Signed)
Good to see you today  Try daily mira lax for constipation  Walk 20 minutes every day you are off work  Work on sleep to help with weight loss  Follow up in 6 months

## 2017-09-26 LAB — CYTOLOGY - PAP
BACTERIAL VAGINITIS: NEGATIVE
Candida vaginitis: NEGATIVE
DIAGNOSIS: NEGATIVE
HPV: NOT DETECTED

## 2017-10-06 ENCOUNTER — Encounter: Payer: Self-pay | Admitting: Family Medicine

## 2017-10-07 ENCOUNTER — Other Ambulatory Visit: Payer: Self-pay | Admitting: Family Medicine

## 2017-10-07 DIAGNOSIS — M545 Low back pain, unspecified: Secondary | ICD-10-CM

## 2017-10-07 DIAGNOSIS — G8929 Other chronic pain: Secondary | ICD-10-CM

## 2017-10-07 MED ORDER — MELOXICAM 15 MG PO TABS: 15.0000 mg | ORAL_TABLET | Freq: Every day | ORAL | 1 refills | Status: DC | PRN

## 2017-10-07 MED ORDER — CYCLOBENZAPRINE HCL 10 MG PO TABS: 5.0000 mg | ORAL_TABLET | Freq: Every evening | ORAL | 0 refills | Status: DC | PRN

## 2017-10-08 ENCOUNTER — Encounter: Payer: Self-pay | Admitting: Family Medicine

## 2017-10-08 DIAGNOSIS — E669 Obesity, unspecified: Secondary | ICD-10-CM | POA: Insufficient documentation

## 2017-10-08 DIAGNOSIS — R5382 Chronic fatigue, unspecified: Secondary | ICD-10-CM | POA: Insufficient documentation

## 2017-10-08 DIAGNOSIS — F4323 Adjustment disorder with mixed anxiety and depressed mood: Secondary | ICD-10-CM | POA: Insufficient documentation

## 2017-10-08 DIAGNOSIS — R5383 Other fatigue: Secondary | ICD-10-CM | POA: Insufficient documentation

## 2017-11-19 ENCOUNTER — Other Ambulatory Visit: Payer: Self-pay | Admitting: Family Medicine

## 2017-11-19 DIAGNOSIS — I10 Essential (primary) hypertension: Secondary | ICD-10-CM

## 2017-11-19 DIAGNOSIS — F4323 Adjustment disorder with mixed anxiety and depressed mood: Secondary | ICD-10-CM

## 2017-12-26 ENCOUNTER — Other Ambulatory Visit: Payer: Self-pay | Admitting: Family Medicine

## 2017-12-26 DIAGNOSIS — F4323 Adjustment disorder with mixed anxiety and depressed mood: Secondary | ICD-10-CM

## 2017-12-27 ENCOUNTER — Other Ambulatory Visit: Payer: Self-pay | Admitting: Family Medicine

## 2017-12-27 DIAGNOSIS — M545 Low back pain: Principal | ICD-10-CM

## 2017-12-27 DIAGNOSIS — G8929 Other chronic pain: Secondary | ICD-10-CM

## 2017-12-27 NOTE — Telephone Encounter (Signed)
Last office visit 10/07/2017 for back pain.  Last refilled 10/07/2017 for #30 with no refills. Follow up scheduled for 03/28/2018 Ok to refill?

## 2017-12-28 MED ORDER — CYCLOBENZAPRINE HCL 10 MG PO TABS: 5.0000 mg | ORAL_TABLET | Freq: Every evening | ORAL | 0 refills | Status: DC | PRN

## 2017-12-29 ENCOUNTER — Encounter: Payer: Self-pay | Admitting: Family Medicine

## 2018-01-01 NOTE — Telephone Encounter (Signed)
Last filled 09/22/17 # 30 Refills 2  Last OV 09/22/17

## 2018-01-06 DIAGNOSIS — J029 Acute pharyngitis, unspecified: Secondary | ICD-10-CM | POA: Diagnosis not present

## 2018-01-26 ENCOUNTER — Encounter: Payer: Self-pay | Admitting: Family Medicine

## 2018-01-26 ENCOUNTER — Ambulatory Visit (INDEPENDENT_AMBULATORY_CARE_PROVIDER_SITE_OTHER): Payer: BLUE CROSS/BLUE SHIELD | Admitting: Family Medicine

## 2018-01-26 VITALS — BP 120/82 | HR 95 | Temp 98.5°F | Resp 12 | Wt 164.2 lb

## 2018-01-26 DIAGNOSIS — M549 Dorsalgia, unspecified: Secondary | ICD-10-CM

## 2018-01-26 MED ORDER — KETOROLAC TROMETHAMINE 60 MG/2ML IM SOLN
60.0000 mg | Freq: Once | INTRAMUSCULAR | Status: AC
  Administered 2018-01-26: 60 mg via INTRAMUSCULAR

## 2018-01-26 MED ORDER — METHOCARBAMOL 500 MG PO TABS: 500.0000 mg | ORAL_TABLET | Freq: Three times a day (TID) | ORAL | 0 refills | Status: AC | PRN

## 2018-01-26 MED ORDER — DICLOFENAC SODIUM 75 MG PO TBEC: 75.0000 mg | DELAYED_RELEASE_TABLET | Freq: Two times a day (BID) | ORAL | 0 refills | Status: AC

## 2018-01-26 NOTE — Progress Notes (Signed)
ACUTE VISIT   HPI:  Chief Complaint  Patient presents with  . Muscle Pain    Pt present for back pain that she claims are related to work. Pt has tried advil.     Tami Foster is a 37 y.o. female, who is here today complaining of pain, bilateral back pain that started after pulling a muscle while she was working. 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   According to patient, she reported injury but a Worker's Comp documentation was not completed.  Initially pain was sharp, now is moderate soreness and intermittent.   Pain is not radiated, exacerbated by upper extremity movement and certain positions. Alleviated by rest. Slowly getting better.  Negative for associated upper extremity numbness or tingling.  No rash or edema on area, fever, chills, or abnormal wt loss.  Prior Hx of back pain: History of lower back pain with sciatic, she denies history of upper back pain.   OTC medications: Ibuprofen.  She has not taken any medication today.    Review of Systems  Constitutional: Negative for appetite change and fever.  HENT: Negative for mouth sores, sore throat, trouble swallowing and voice change.   Respiratory: Negative for cough, chest tightness, shortness of breath and wheezing.   Gastrointestinal: Negative for nausea and vomiting.  Musculoskeletal: Positive for back pain. Negative for neck pain.  Skin: Negative for rash.  Neurological: Negative for syncope, weakness, numbness and headaches.  Psychiatric/Behavioral: Negative for confusion.      Current Outpatient Medications on File Prior to Visit  Medication Sig Dispense Refill  . albuterol (PROVENTIL HFA;VENTOLIN HFA) 108 (90 Base) MCG/ACT inhaler Inhale 2 puffs into the lungs every 4 (four) hours as needed for wheezing or shortness of breath (cough, shortness of breath or wheezing.). 1 Inhaler 1  . ALPRAZolam  (XANAX) 1 MG tablet TAKE 1/2- 1 TABLET BY MOUTH TWICE A DAY AS NEEDED FOR ANXIETY 30 tablet 0  . cyclobenzaprine (FLEXERIL) 10 MG tablet Take 0.5-1 tablets (5-10 mg total) by mouth at bedtime as needed for muscle spasms. 30 tablet 0  . ipratropium (ATROVENT) 0.06 % nasal spray INSTILL 2 SPRAYS IN EACH NOSTRIL 3-4 TIMES A DAY AS NEEDED FOR RUNNY NOSE  0  . meloxicam (MOBIC) 15 MG tablet Take 1 tablet (15 mg total) by mouth daily as needed for pain. 30 tablet 1  . metoprolol succinate (TOPROL-XL) 50 MG 24 hr tablet TAKE 1 TABLET BY MOUTH EVERY DAY 90 tablet 1  . ondansetron (ZOFRAN-ODT) 8 MG disintegrating tablet Take 1 tablet (8 mg total) by mouth every 8 (eight) hours as needed for nausea. 12 tablet 0   No current facility-administered medications on file prior to visit.      Past Medical History:  Diagnosis Date  . Abnormal Pap smear   . Anxiety   . GERD (gastroesophageal reflux disease)   . Hypertension    No Known Allergies  Social History   Socioeconomic History  . Marital status: Married    Spouse name: Not on file  . Number of children: Not on file  . Years of education: Not on file  . Highest education level: Not on file  Occupational History  . Not on file  Social Needs  . Financial resource strain: Not on file  . Food insecurity:    Worry: Not on file    Inability:  Not on file  . Transportation needs:    Medical: Not on file    Non-medical: Not on file  Tobacco Use  . Smoking status: Current Every Day Smoker    Packs/day: 0.50    Years: 14.00    Pack years: 7.00    Types: Cigarettes    Last attempt to quit: 06/09/2017    Years since quitting: 0.6  . Smokeless tobacco: Never Used  Substance and Sexual Activity  . Alcohol use: Yes    Alcohol/week: 0.0 standard drinks    Comment: Rare  . Drug use: No  . Sexual activity: Yes    Partners: Male    Birth control/protection: Inserts    Comment: 1st intercourse- 19, partners- 6  Lifestyle  . Physical activity:      Days per week: Not on file    Minutes per session: Not on file  . Stress: Not on file  Relationships  . Social connections:    Talks on phone: Not on file    Gets together: Not on file    Attends religious service: Not on file    Active member of club or organization: Not on file    Attends meetings of clubs or organizations: Not on file    Relationship status: Not on file  Other Topics Concern  . Not on file  Social History Narrative  . Not on file    Vitals:   01/26/18 1111  BP: 120/82  Pulse: 95  Resp: 12  Temp: 98.5 F (36.9 C)  SpO2: 98%   Body mass index is 31.03 kg/m.   Physical Exam  Nursing note and vitals reviewed. Constitutional: She is oriented to person, place, and time. She appears well-developed. She does not appear ill. No distress.  HENT:  Head: Normocephalic and atraumatic.  Eyes: Conjunctivae are normal.  Cardiovascular: Normal rate and regular rhythm.  Respiratory: Effort normal and breath sounds normal. No respiratory distress.  Musculoskeletal: She exhibits no edema.       Cervical back: She exhibits tenderness and spasm. She exhibits normal range of motion and no bony tenderness.       Thoracic back: She exhibits tenderness. She exhibits no bony tenderness.       Back:  No significant deformity appreciated. Tenderness upon palpation of lower cervical and upper thoracic paraspinal muscles.Also pain upon palpation of right trapezium. Pain elicited with movement of shoulders,ROM is not limited. No local edema or erythema appreciated, no suspicious lesions.  Neurological: She is alert and oriented to person, place, and time. She has normal strength. Gait normal.  Skin: Skin is warm. No rash noted. No erythema.  Psychiatric: She has a normal mood and affect.  Well groomed, good eye contact.      ASSESSMENT AND PLAN:   Tami Foster was seen today for muscle pain.  Diagnoses and all orders for this visit:  Acute upper back pain -      diclofenac (VOLTAREN) 75 MG EC tablet; Take 1 tablet (75 mg total) by mouth 2 (two) times daily for 7 days. -     methocarbamol (ROBAXIN) 500 MG tablet; Take 1 tablet (500 mg total) by mouth every 8 (eight) hours as needed for up to 15 days for muscle spasms. -     ketorolac (TORADOL) injection 60 mg    Here in the office and after verbal consent, she received Toradol 60 mg IM. Instructed to start diclofenac 75 mg in about 8 hours and continue twice daily for  7 days. We discussed some side effects of muscle relaxants and NSAIDs. Local massage, range of motion exercises, and OTC Aspercreme or icy hot with lidocaine may also help. Because no history of blunt trauma, I do not think imaging is necessary today. Recommend have employer completing documentation for Worker's Comp if in fact this was related to her work functions/activities as she reported. Instructed about warning signs. Follow-up with PCP in 2 weeks, before if needed.    Return in about 2 weeks (around 02/09/2018) for PCP.      Betty G. [place], MD  [facility]. [place] office.

## 2018-01-26 NOTE — Patient Instructions (Addendum)
A few things to remember from today's visit:   Acute upper back pain - Plan: diclofenac (VOLTAREN) 75 MG EC tablet, methocarbamol (ROBAXIN) 500 MG tablet  Start Diclofenac in 8 hours and take it with food.   Please be sure medication list is accurate. If a new problem present, please set up appointment sooner than planned today.

## 2018-01-31 ENCOUNTER — Other Ambulatory Visit: Payer: Self-pay | Admitting: Family Medicine

## 2018-01-31 DIAGNOSIS — F4323 Adjustment disorder with mixed anxiety and depressed mood: Secondary | ICD-10-CM

## 2018-02-02 MED ORDER — ALPRAZOLAM 1 MG PO TABS: ORAL_TABLET | ORAL | 0 refills | Status: DC

## 2018-02-02 NOTE — Telephone Encounter (Signed)
Last filled 01/01/18 # 30 refills 0  Last OV 01/26/18

## 2018-02-27 ENCOUNTER — Other Ambulatory Visit: Payer: Self-pay | Admitting: Family Medicine

## 2018-02-27 DIAGNOSIS — F4323 Adjustment disorder with mixed anxiety and depressed mood: Secondary | ICD-10-CM

## 2018-02-28 MED ORDER — ALPRAZOLAM 1 MG PO TABS: ORAL_TABLET | ORAL | 0 refills | Status: DC

## 2018-02-28 NOTE — Telephone Encounter (Signed)
Last OV 09/22/17 Last filled 02/02/18 #30 refills 0

## 2018-03-04 ENCOUNTER — Encounter: Payer: Self-pay | Admitting: Family Medicine

## 2018-03-04 DIAGNOSIS — R21 Rash and other nonspecific skin eruption: Secondary | ICD-10-CM | POA: Diagnosis not present

## 2018-03-07 ENCOUNTER — Encounter: Payer: Self-pay | Admitting: Family Medicine

## 2018-03-07 ENCOUNTER — Ambulatory Visit (INDEPENDENT_AMBULATORY_CARE_PROVIDER_SITE_OTHER): Payer: BLUE CROSS/BLUE SHIELD | Admitting: Family Medicine

## 2018-03-07 VITALS — BP 112/70 | HR 83 | Temp 97.8°F | Ht 61.0 in | Wt 163.0 lb

## 2018-03-07 DIAGNOSIS — R21 Rash and other nonspecific skin eruption: Secondary | ICD-10-CM | POA: Diagnosis not present

## 2018-03-07 DIAGNOSIS — J069 Acute upper respiratory infection, unspecified: Secondary | ICD-10-CM

## 2018-03-07 DIAGNOSIS — M545 Low back pain, unspecified: Secondary | ICD-10-CM

## 2018-03-07 DIAGNOSIS — M546 Pain in thoracic spine: Secondary | ICD-10-CM | POA: Diagnosis not present

## 2018-03-07 DIAGNOSIS — G8929 Other chronic pain: Secondary | ICD-10-CM

## 2018-03-07 NOTE — Progress Notes (Signed)
Subjective:    Patient ID: Tami Foster, female    DOB: 07/09/1980, 37 y.o.   MRN: [id number]  HPI This is a 37 yo female who presents today with rash x 5 days, she went to UC and was started on doxy for possible tick born illness and given a shot of decadron. First noticed on arms and spread to the rest of her body. Felt ok prior to rash starting. Now feels achy. Has had a cough for at least a month. Was concerned about strep/scarlet fever.   Work is crazy. She hurt her upper back about a month ago. No improvement. Chronic low back pain. Has been doing some stretching, little improvement with meloxicam and muscle relaxers. Some tingling of right arm with certain positions, resolves with position changes.   Cough productive of green/brown sputum, chunky, runny nose (clear), cough worse in am. Wet, hacking cough. No OTC treatments. Smoking. Has not used albuterol.   Past Medical History:  Diagnosis Date  . Abnormal Pap smear   . Anxiety   . GERD (gastroesophageal reflux disease)   . Hypertension    Past Surgical History:  Procedure Laterality Date  . LEEP    . PERIURETHRAL ABSCESS DRAINAGE     abscess removed from tonsils. Tonsils not removed  . WISDOM TOOTH EXTRACTION     Family History  Problem Relation Age of Onset  . Cancer Mother        bladder  . Hypertension Mother   . Emphysema Father   . Hypertension Father   . Diabetes Paternal Grandmother   . Other Neg Hx    Social History   Tobacco Use  . Smoking status: Current Every Day Smoker    Packs/day: 0.50    Years: 14.00    Pack years: 7.00    Types: Cigarettes    Last attempt to quit: 06/09/2017    Years since quitting: 0.7  . Smokeless tobacco: Never Used  Substance Use Topics  . Alcohol use: Yes    Alcohol/week: 0.0 standard drinks    Comment: Rare  . Drug use: No        Review of Systems Per HPI    Objective:   Physical Exam  Constitutional: She is oriented to person, place, and time. She  appears well-developed and well-nourished. No distress.  HENT:  Head: Normocephalic and atraumatic.  Nose: Nose normal.  Mouth/Throat: Oropharynx is clear and moist.  Eyes: Conjunctivae are normal.  Neck: Normal range of motion. Neck supple.  Cardiovascular: Normal rate, regular rhythm and normal heart sounds.  Pulmonary/Chest: Effort normal and breath sounds normal.  Musculoskeletal: She exhibits no edema.       Cervical back: Normal.       Thoracic back: She exhibits tenderness. She exhibits normal range of motion and no swelling.       Lumbar back: She exhibits tenderness. She exhibits normal range of motion.  Lymphadenopathy:    She has no cervical adenopathy.  Neurological: She is alert and oriented to person, place, and time.  Skin: Skin is warm and dry. Rash (resolving, macular, erythematous, lesions on arms/legs/palms/soles/trunk. None on face. ) noted. She is not diaphoretic.  Psychiatric: She has a normal mood and affect. Her behavior is normal. Judgment and thought content normal.  Vitals reviewed.     BP 112/70 (BP Location: Right Arm, Patient Position: Sitting, Cuff Size: Normal)   Pulse 83   Temp 97.8 F (36.6 C) (Oral)   Ht 5\' 1"  (1.549  m)   Wt 163 lb (73.9 kg)   LMP 02/25/2018   SpO2 97%   BMI 30.80 kg/m  Wt Readings from Last 3 Encounters:  03/07/18 163 lb (73.9 kg)  01/26/18 164 lb 3.2 oz (74.5 kg)  09/22/17 169 lb (76.7 kg)       Assessment & Plan:  1. Rash - resolving - will request records from UC and let patient know if additional labs needed  2. Chronic bilateral low back pain without sciatica - Discussed PT and medication. She will check with her insurance and let me know about PT coverage  3. Acute upper respiratory infection - still on doxy, discussed symptomatic treatment, using albuterol inhaler regularly - RTC precautions reviewed  4. Chronic midline thoracic back pain - see #3  Olean Ree, FNP-BC  [facility] at  [facility], [facility]  03/08/2018 10:10 AM

## 2018-03-07 NOTE — Patient Instructions (Addendum)
Use your albuterol inhaler every 4-6 hours for cough, chest tightness  Look at your insurance and see how PT is covered, let me know if you want to proceed and if there is a preferred provider

## 2018-03-08 ENCOUNTER — Encounter: Payer: Self-pay | Admitting: Family Medicine

## 2018-03-12 ENCOUNTER — Other Ambulatory Visit (INDEPENDENT_AMBULATORY_CARE_PROVIDER_SITE_OTHER): Payer: BLUE CROSS/BLUE SHIELD

## 2018-03-12 ENCOUNTER — Other Ambulatory Visit: Payer: Self-pay | Admitting: Family Medicine

## 2018-03-12 ENCOUNTER — Encounter: Payer: Self-pay | Admitting: Family Medicine

## 2018-03-12 DIAGNOSIS — R21 Rash and other nonspecific skin eruption: Secondary | ICD-10-CM

## 2018-03-12 LAB — CBC WITH DIFFERENTIAL/PLATELET
BASOS PCT: 0.5 % (ref 0.0–3.0)
Basophils Absolute: 0 10*3/uL (ref 0.0–0.1)
Eosinophils Absolute: 0.1 10*3/uL (ref 0.0–0.7)
Eosinophils Relative: 1.6 % (ref 0.0–5.0)
HCT: 42.5 % (ref 36.0–46.0)
Hemoglobin: 14.4 g/dL (ref 12.0–15.0)
LYMPHS ABS: 3 10*3/uL (ref 0.7–4.0)
Lymphocytes Relative: 32.1 % (ref 12.0–46.0)
MCHC: 33.8 g/dL (ref 30.0–36.0)
MCV: 92.9 fl (ref 78.0–100.0)
MONOS PCT: 7 % (ref 3.0–12.0)
Monocytes Absolute: 0.6 10*3/uL (ref 0.1–1.0)
NEUTROS ABS: 5.4 10*3/uL (ref 1.4–7.7)
Neutrophils Relative %: 58.8 % (ref 43.0–77.0)
PLATELETS: 211 10*3/uL (ref 150.0–400.0)
RBC: 4.58 Mil/uL (ref 3.87–5.11)
RDW: 13.4 % (ref 11.5–15.5)
WBC: 9.3 10*3/uL (ref 4.0–10.5)

## 2018-03-12 LAB — COMPREHENSIVE METABOLIC PANEL
ALT: 21 U/L (ref 0–35)
AST: 19 U/L (ref 0–37)
Albumin: 4 g/dL (ref 3.5–5.2)
Alkaline Phosphatase: 93 U/L (ref 39–117)
BUN: 11 mg/dL (ref 6–23)
CALCIUM: 9.2 mg/dL (ref 8.4–10.5)
CO2: 27 meq/L (ref 19–32)
CREATININE: 0.6 mg/dL (ref 0.40–1.20)
Chloride: 105 mEq/L (ref 96–112)
GFR: 120.84 mL/min (ref >60.00)
Glucose, Bld: 98 mg/dL (ref 70–99)
Potassium: 4.2 mEq/L (ref 3.5–5.1)
Sodium: 136 mEq/L (ref 135–145)
TOTAL PROTEIN: 6.6 g/dL (ref 6.0–8.3)
Total Bilirubin: 0.4 mg/dL (ref 0.2–1.2)

## 2018-03-12 LAB — SEDIMENTATION RATE: Sed Rate: 6 mm/hr (ref 0–20)

## 2018-03-14 LAB — ANA: ANA: NEGATIVE

## 2018-03-28 ENCOUNTER — Ambulatory Visit: Payer: Self-pay | Admitting: Family Medicine

## 2018-03-29 ENCOUNTER — Other Ambulatory Visit: Payer: Self-pay | Admitting: Family Medicine

## 2018-03-29 DIAGNOSIS — F4323 Adjustment disorder with mixed anxiety and depressed mood: Secondary | ICD-10-CM

## 2018-03-30 MED ORDER — ALPRAZOLAM 1 MG PO TABS: ORAL_TABLET | ORAL | 0 refills | Status: DC

## 2018-03-30 NOTE — Telephone Encounter (Signed)
Last filled 02/28/18 # 30 refills 0  Last OV 03/07/18

## 2018-04-24 ENCOUNTER — Other Ambulatory Visit: Payer: Self-pay | Admitting: Family Medicine

## 2018-04-24 DIAGNOSIS — F4323 Adjustment disorder with mixed anxiety and depressed mood: Secondary | ICD-10-CM

## 2018-04-24 NOTE — Telephone Encounter (Signed)
Last filled 03/30/18 #30 refills 0  Last OV 03/07/18   Should additional refills be provided?

## 2018-04-25 MED ORDER — ALPRAZOLAM 1 MG PO TABS: ORAL_TABLET | ORAL | 1 refills | Status: DC

## 2018-05-27 ENCOUNTER — Other Ambulatory Visit: Payer: Self-pay | Admitting: Family Medicine

## 2018-05-27 DIAGNOSIS — F4323 Adjustment disorder with mixed anxiety and depressed mood: Secondary | ICD-10-CM

## 2018-05-27 DIAGNOSIS — I10 Essential (primary) hypertension: Secondary | ICD-10-CM

## 2018-06-07 ENCOUNTER — Telehealth: Payer: Self-pay | Admitting: Family Medicine

## 2018-06-07 NOTE — Telephone Encounter (Signed)
Left message asking pt to call office regarding schedule appointment   Pt requested through my chart

## 2018-06-09 DIAGNOSIS — R5383 Other fatigue: Secondary | ICD-10-CM | POA: Diagnosis not present

## 2018-06-09 DIAGNOSIS — B349 Viral infection, unspecified: Secondary | ICD-10-CM | POA: Diagnosis not present

## 2018-06-09 DIAGNOSIS — R1032 Left lower quadrant pain: Secondary | ICD-10-CM | POA: Diagnosis not present

## 2018-06-09 DIAGNOSIS — R109 Unspecified abdominal pain: Secondary | ICD-10-CM | POA: Diagnosis not present

## 2018-06-11 ENCOUNTER — Ambulatory Visit: Payer: Self-pay | Admitting: Family Medicine

## 2018-06-20 ENCOUNTER — Other Ambulatory Visit: Payer: Self-pay | Admitting: Family Medicine

## 2018-06-20 DIAGNOSIS — F4323 Adjustment disorder with mixed anxiety and depressed mood: Secondary | ICD-10-CM

## 2018-07-13 ENCOUNTER — Other Ambulatory Visit: Payer: Self-pay | Admitting: Family Medicine

## 2018-07-13 DIAGNOSIS — M545 Low back pain, unspecified: Secondary | ICD-10-CM

## 2018-07-13 DIAGNOSIS — G8929 Other chronic pain: Secondary | ICD-10-CM

## 2018-07-13 MED ORDER — CYCLOBENZAPRINE HCL 10 MG PO TABS: 5.0000 mg | ORAL_TABLET | Freq: Every evening | ORAL | 0 refills | Status: DC | PRN

## 2018-07-13 NOTE — Telephone Encounter (Signed)
Last OV 03/07/2018   Last refilled 12/28/2017 disp 30 with no refills   Sent to PCP

## 2018-07-22 ENCOUNTER — Other Ambulatory Visit: Payer: Self-pay | Admitting: Family Medicine

## 2018-07-22 DIAGNOSIS — F4323 Adjustment disorder with mixed anxiety and depressed mood: Secondary | ICD-10-CM

## 2018-07-23 ENCOUNTER — Other Ambulatory Visit: Payer: Self-pay | Admitting: Family Medicine

## 2018-07-23 ENCOUNTER — Encounter: Payer: Self-pay | Admitting: Family Medicine

## 2018-07-23 DIAGNOSIS — F4323 Adjustment disorder with mixed anxiety and depressed mood: Secondary | ICD-10-CM

## 2018-07-23 MED ORDER — ALPRAZOLAM 1 MG PO TABS: ORAL_TABLET | ORAL | 0 refills | Status: DC

## 2018-07-23 NOTE — Telephone Encounter (Signed)
Please advise  Last filled 06/22/18 qty #30  Last ov 03/07/18- looks like an acute visit

## 2018-07-25 ENCOUNTER — Ambulatory Visit (INDEPENDENT_AMBULATORY_CARE_PROVIDER_SITE_OTHER): Payer: BLUE CROSS/BLUE SHIELD | Admitting: Family Medicine

## 2018-07-25 ENCOUNTER — Other Ambulatory Visit: Payer: Self-pay

## 2018-07-25 ENCOUNTER — Encounter: Payer: Self-pay | Admitting: Family Medicine

## 2018-07-25 VITALS — BP 122/78 | HR 132 | Temp 98.2°F | Ht 61.0 in | Wt 158.8 lb

## 2018-07-25 DIAGNOSIS — F4323 Adjustment disorder with mixed anxiety and depressed mood: Secondary | ICD-10-CM

## 2018-07-25 DIAGNOSIS — I10 Essential (primary) hypertension: Secondary | ICD-10-CM

## 2018-07-25 DIAGNOSIS — Z720 Tobacco use: Secondary | ICD-10-CM

## 2018-07-25 DIAGNOSIS — Z0000 Encounter for general adult medical examination without abnormal findings: Secondary | ICD-10-CM | POA: Diagnosis not present

## 2018-07-25 MED ORDER — METOPROLOL SUCCINATE ER 100 MG PO TB24: 100.0000 mg | ORAL_TABLET | Freq: Every day | ORAL | 3 refills | Status: DC

## 2018-07-25 MED ORDER — SERTRALINE HCL 100 MG PO TABS: 100.0000 mg | ORAL_TABLET | Freq: Every day | ORAL | 3 refills | Status: DC

## 2018-07-25 NOTE — Progress Notes (Signed)
Subjective:    Patient ID: Tami Foster, female    DOB: February 10, 1981, 38 y.o.   MRN: [id number]  HPI This is a 38 yo female who presents today for CPE.   Last CPE- 5/19 Pap-5/19- nml, needs repeat 5/20 due to abnormal previous Tdap- 07/20/16 Flu- [facility]- regular Exercise- works at [employer], does stretching for her back.  Tobacco- smoking 1/2-1 ppd   Anxiety- taking alprazolam nightly, occasionally during the day. Helps her sleep. Has tried effexor and bupropion previously. Stress with work and having step daughters living with her and her husband and their 55 yo. Poor sleep, son with difficulty falling asleep, sleeps in bed with them. She is always afraid something bad is going to happen. Feels tired all of the time, but can't sleep at night.    Heart rate- running 110-115. Taking metoprolol xl 50 mg. Caffeine- drinks 2-3 cups of coffee daily, when working will also have 2 [employer] drinks (diet).   Past Medical History:  Diagnosis Date  . Abnormal Pap smear   . Anxiety   . GERD (gastroesophageal reflux disease)   . Hypertension    Past Surgical History:  Procedure Laterality Date  . LEEP    . PERIURETHRAL ABSCESS DRAINAGE     abscess removed from tonsils. Tonsils not removed  . WISDOM TOOTH EXTRACTION     Family History  Problem Relation Age of Onset  . Cancer Mother        bladder  . Hypertension Mother   . Emphysema Father   . Hypertension Father   . Diabetes Paternal Grandmother   . Other Neg Hx    Social History   Tobacco Use  . Smoking status: Current Every Day Smoker    Packs/day: 0.50    Years: 14.00    Pack years: 7.00    Types: Cigarettes    Last attempt to quit: 06/09/2017    Years since quitting: 1.1  . Smokeless tobacco: Never Used  Substance Use Topics  . Alcohol use: Yes    Alcohol/week: 0.0 standard drinks    Comment: Rare  . Drug use: No      Review of Systems  Constitutional: Positive for fatigue (chronic).  HENT: Negative.   Eyes:  Negative.   Respiratory: Negative.   Cardiovascular: Negative.   Gastrointestinal: Negative.   Endocrine: Positive for heat intolerance.  Genitourinary: Positive for menstrual problem (regular cycles, severe cramps, some improvement with ibuprofen or naprosyn, last 3-4 days).  Musculoskeletal: Positive for back pain (improved with regular inversion table use).  Neurological: Positive for headaches (occasional).       Objective:   Physical Exam Vitals signs reviewed.  Constitutional:      General: She is not in acute distress.    Appearance: Normal appearance. She is obese. She is not ill-appearing, toxic-appearing or diaphoretic.  HENT:     Head: Normocephalic and atraumatic.  Neck:     Musculoskeletal: Normal range of motion and neck supple.  Cardiovascular:     Rate and Rhythm: Regular rhythm. Tachycardia present.     Pulses: Normal pulses.     Heart sounds: Normal heart sounds.     Comments: 104 on auscultation  Pulmonary:     Effort: Pulmonary effort is normal.     Breath sounds: Normal breath sounds.  Chest:     Breasts: Breasts are symmetrical.        Right: Normal.        Left: Normal.     Comments:  Right nipple piercing.  Abdominal:     General: Abdomen is flat. There is no distension.     Palpations: Abdomen is soft. There is no mass.     Tenderness: There is no abdominal tenderness. There is no guarding or rebound.     Hernia: No hernia is present.  Musculoskeletal: Normal range of motion.  Lymphadenopathy:     Upper Body:     Right upper body: No supraclavicular, axillary or pectoral adenopathy.     Left upper body: No supraclavicular, axillary or pectoral adenopathy.  Skin:    General: Skin is warm.  Neurological:     Mental Status: She is alert and oriented to person, place, and time.  Psychiatric:        Mood and Affect: Mood normal.        Behavior: Behavior normal.        Thought Content: Thought content normal.        Judgment: Judgment normal.        BP 122/78   Pulse (!) 132   Temp 98.2 F (36.8 C)   Ht 5\' 1"  (1.549 m)   Wt 158 lb 12.8 oz (72 kg)   SpO2 95%   BMI 30.00 kg/m  Wt Readings from Last 3 Encounters:  07/25/18 158 lb 12.8 oz (72 kg)  03/07/18 163 lb (73.9 kg)  01/26/18 164 lb 3.2 oz (74.5 kg)   GAD 7 : Generalized Anxiety Score 07/25/2018  Nervous, Anxious, on Edge 2  Control/stop worrying 2  Worry too much - different things 2  Trouble relaxing 2  Restless 1  Easily annoyed or irritable 3  Afraid - awful might happen 3  Total GAD 7 Score 15    Depression screen [facility] 2/9 07/25/2018 09/22/2017 07/20/2016 10/13/2015 05/26/2015  Decreased Interest 2 1 0 0 0  Down, Depressed, Hopeless 3 1 0 0 0  PHQ - 2 Score 5 2 0 0 0  Altered sleeping 2 1 - - -  Tired, decreased energy 2 2 - - -  Change in appetite 0 0 - - -  Feeling bad or failure about yourself  1 0 - - -  Trouble concentrating 2 1 - - -  Moving slowly or fidgety/restless 1 0 - - -  Suicidal thoughts 0 0 - - -  PHQ-9 Score 13 6 - - -  Difficult doing work/chores Very difficult - - - -       Assessment & Plan:  1. Annual physical exam - Discussed and encouraged healthy lifestyle choices- adequate sleep, regular exercise, stress management and healthy food choices.  - encouraged her to take daily otc prenatal vitamin since she and her husband are not using contraception  2. Adjustment disorder with mixed anxiety and depressed mood - encouraged her to set limits with her family, discussed trying different SSRI, reviewed potential side effects, expectations of treatment - metoprolol succinate (TOPROL-XL) 100 MG 24 hr tablet; Take 1 tablet (100 mg total) by mouth daily. Take with or immediately following a meal.  Dispense: 90 tablet; Refill: 3 - sertraline (ZOLOFT) 100 MG tablet; Take 1 tablet (100 mg total) by mouth at bedtime.  Dispense: 90 tablet; Refill: 3 - follow up via mychart in 1 month, office visit in 3 months, sooner if needed  3. Essential  hypertension - well controlled, some increased HR and anxiety, will try increasing metoprolol to 100 mg daily - metoprolol succinate (TOPROL-XL) 100 MG 24 hr tablet; Take 1 tablet (100  mg total) by mouth daily. Take with or immediately following a meal.  Dispense: 90 tablet; Refill: 3  4. Tobacco abuse - she feels that now is not a good time to try to quit with her increased stress  Olean Ree, FNP-BC  [facility], [facility]  07/25/2018 12:47 PM

## 2018-07-25 NOTE — Patient Instructions (Addendum)
Good to see you today.   Please take 2 of your metoprolol until finished. I have sent in new dose of 100 mg.   I have sent in sertraline 100 mg. Take 1/2 tablet nightly x 7 days, then increase to 1 tablet nightly  Follow up with me via Mychart in 4 weeks, sooner if no improvement Schedule an office visit for 3 months  Books I like-   Parent Power  Boundaries with Kids  Health Maintenance, Female Adopting a healthy lifestyle and getting preventive care can go a long way to promote health and wellness. Talk with your health care provider about what schedule of regular examinations is right for you. This is a good chance for you to check in with your provider about disease prevention and staying healthy. In between checkups, there are plenty of things you can do on your own. Experts have done a lot of research about which lifestyle changes and preventive measures are most likely to keep you healthy. Ask your health care provider for more information. Weight and diet Eat a healthy diet  Be sure to include plenty of vegetables, fruits, low-fat dairy products, and lean protein.  Do not eat a lot of foods high in solid fats, added sugars, or salt.  Get regular exercise. This is one of the most important things you can do for your health. ? Most adults should exercise for at least 150 minutes each week. The exercise should increase your heart rate and make you sweat (moderate-intensity exercise). ? Most adults should also do strengthening exercises at least twice a week. This is in addition to the moderate-intensity exercise. Maintain a healthy weight  Body mass index (BMI) is a measurement that can be used to identify possible weight problems. It estimates body fat based on height and weight. Your health care provider can help determine your BMI and help you achieve or maintain a healthy weight.  For females 37 years of age and older: ? A BMI below 18.5 is considered underweight. ? A BMI  of 18.5 to 24.9 is normal. ? A BMI of 25 to 29.9 is considered overweight. ? A BMI of 30 and above is considered obese. Watch levels of cholesterol and blood lipids  You should start having your blood tested for lipids and cholesterol at 36 years of age, then have this test every 5 years.  You may need to have your cholesterol levels checked more often if: ? Your lipid or cholesterol levels are high. ? You are older than 36 years of age. ? You are at high risk for heart disease. Cancer screening Lung Cancer  Lung cancer screening is recommended for adults 8-68 years old who are at high risk for lung cancer because of a history of smoking.  A yearly low-dose CT scan of the lungs is recommended for people who: ? Currently smoke. ? Have quit within the past 15 years. ? Have at least a 30-pack-year history of smoking. A pack year is smoking an average of one pack of cigarettes a day for 1 year.  Yearly screening should continue until it has been 15 years since you quit.  Yearly screening should stop if you develop a health problem that would prevent you from having lung cancer treatment. Breast Cancer  Practice breast self-awareness. This means understanding how your breasts normally appear and feel.  It also means doing regular breast self-exams. Let your health care provider know about any changes, no matter how small.  If you  are in your 20s or 30s, you should have a clinical breast exam (CBE) by a health care provider every 1-3 years as part of a regular health exam.  If you are 72 or older, have a CBE every year. Also consider having a breast X-ray (mammogram) every year.  If you have a family history of breast cancer, talk to your health care provider about genetic screening.  If you are at high risk for breast cancer, talk to your health care provider about having an MRI and a mammogram every year.  Breast cancer gene (BRCA) assessment is recommended for women who have  family members with BRCA-related cancers. BRCA-related cancers include: ? Breast. ? Ovarian. ? Tubal. ? Peritoneal cancers.  Results of the assessment will determine the need for genetic counseling and BRCA1 and BRCA2 testing. Cervical Cancer Your health care provider may recommend that you be screened regularly for cancer of the pelvic organs (ovaries, uterus, and vagina). This screening involves a pelvic examination, including checking for microscopic changes to the surface of your cervix (Pap test). You may be encouraged to have this screening done every 3 years, beginning at age 77.  For women ages 53-65, health care providers may recommend pelvic exams and Pap testing every 3 years, or they may recommend the Pap and pelvic exam, combined with testing for human papilloma virus (HPV), every 5 years. Some types of HPV increase your risk of cervical cancer. Testing for HPV may also be done on women of any age with unclear Pap test results.  Other health care providers may not recommend any screening for nonpregnant women who are considered low risk for pelvic cancer and who do not have symptoms. Ask your health care provider if a screening pelvic exam is right for you.  If you have had past treatment for cervical cancer or a condition that could lead to cancer, you need Pap tests and screening for cancer for at least 20 years after your treatment. If Pap tests have been discontinued, your risk factors (such as having a new sexual partner) need to be reassessed to determine if screening should resume. Some women have medical problems that increase the chance of getting cervical cancer. In these cases, your health care provider may recommend more frequent screening and Pap tests. Colorectal Cancer  This type of cancer can be detected and often prevented.  Routine colorectal cancer screening usually begins at 35 years of age and continues through 36 years of age.  Your health care provider may  recommend screening at an earlier age if you have risk factors for colon cancer.  Your health care provider may also recommend using home test kits to check for hidden blood in the stool.  A small camera at the end of a tube can be used to examine your colon directly (sigmoidoscopy or colonoscopy). This is done to check for the earliest forms of colorectal cancer.  Routine screening usually begins at age 7.  Direct examination of the colon should be repeated every 5-10 years through 36 years of age. However, you may need to be screened more often if early forms of precancerous polyps or small growths are found. Skin Cancer  Check your skin from head to toe regularly.  Tell your health care provider about any new moles or changes in moles, especially if there is a change in a mole's shape or color.  Also tell your health care provider if you have a mole that is larger than the size of  a pencil eraser.  Always use sunscreen. Apply sunscreen liberally and repeatedly throughout the day.  Protect yourself by wearing long sleeves, pants, a wide-brimmed hat, and sunglasses whenever you are outside. Heart disease, diabetes, and high blood pressure  High blood pressure causes heart disease and increases the risk of stroke. High blood pressure is more likely to develop in: ? People who have blood pressure in the high end of the normal range (130-139/85-89 mm Hg). ? People who are overweight or obese. ? People who are African American.  If you are 49-56 years of age, have your blood pressure checked every 3-5 years. If you are 2 years of age or older, have your blood pressure checked every year. You should have your blood pressure measured twice-once when you are at a hospital or clinic, and once when you are not at a hospital or clinic. Record the average of the two measurements. To check your blood pressure when you are not at a hospital or clinic, you can use: ? An automated blood pressure  machine at a pharmacy. ? A home blood pressure monitor.  If you are between 29 years and 4 years old, ask your health care provider if you should take aspirin to prevent strokes.  Have regular diabetes screenings. This involves taking a blood sample to check your fasting blood sugar level. ? If you are at a normal weight and have a low risk for diabetes, have this test once every three years after 36 years of age. ? If you are overweight and have a high risk for diabetes, consider being tested at a younger age or more often. Preventing infection Hepatitis B  If you have a higher risk for hepatitis B, you should be screened for this virus. You are considered at high risk for hepatitis B if: ? You were born in a country where hepatitis B is common. Ask your health care provider which countries are considered high risk. ? Your parents were born in a high-risk country, and you have not been immunized against hepatitis B (hepatitis B vaccine). ? You have HIV or AIDS. ? You use needles to inject street drugs. ? You live with someone who has hepatitis B. ? You have had sex with someone who has hepatitis B. ? You get hemodialysis treatment. ? You take certain medicines for conditions, including cancer, organ transplantation, and autoimmune conditions. Hepatitis C  Blood testing is recommended for: ? Everyone born from [birth year] through 1963. ? Anyone with known risk factors for hepatitis C. Sexually transmitted infections (STIs)  You should be screened for sexually transmitted infections (STIs) including gonorrhea and chlamydia if: ? You are sexually active and are younger than 36 years of age. ? You are older than 36 years of age and your health care provider tells you that you are at risk for this type of infection. ? Your sexual activity has changed since you were last screened and you are at an increased risk for chlamydia or gonorrhea. Ask your health care provider if you are at risk.  If  you do not have HIV, but are at risk, it may be recommended that you take a prescription medicine daily to prevent HIV infection. This is called pre-exposure prophylaxis (PrEP). You are considered at risk if: ? You are sexually active and do not regularly use condoms or know the HIV status of your partner(s). ? You take drugs by injection. ? You are sexually active with a partner who has HIV. Talk with  your health care provider about whether you are at high risk of being infected with HIV. If you choose to begin PrEP, you should first be tested for HIV. You should then be tested every 3 months for as long as you are taking PrEP. Pregnancy  If you are premenopausal and you may become pregnant, ask your health care provider about preconception counseling.  If you may become pregnant, take 400 to 800 micrograms (mcg) of folic acid every day.  If you want to prevent pregnancy, talk to your health care provider about birth control (contraception). Osteoporosis and menopause  Osteoporosis is a disease in which the bones lose minerals and strength with aging. This can result in serious bone fractures. Your risk for osteoporosis can be identified using a bone density scan.  If you are 14 years of age or older, or if you are at risk for osteoporosis and fractures, ask your health care provider if you should be screened.  Ask your health care provider whether you should take a calcium or vitamin D supplement to lower your risk for osteoporosis.  Menopause may have certain physical symptoms and risks.  Hormone replacement therapy may reduce some of these symptoms and risks. Talk to your health care provider about whether hormone replacement therapy is right for you. Follow these instructions at home:  Schedule regular health, dental, and eye exams.  Stay current with your immunizations.  Do not use any tobacco products including cigarettes, chewing tobacco, or electronic cigarettes.  If you are  pregnant, do not drink alcohol.  If you are breastfeeding, limit how much and how often you drink alcohol.  Limit alcohol intake to no more than 1 drink per day for nonpregnant women. One drink equals 12 ounces of beer, 5 ounces of wine, or 1 ounces of hard liquor.  Do not use street drugs.  Do not share needles.  Ask your health care provider for help if you need support or information about quitting drugs.  Tell your health care provider if you often feel depressed.  Tell your health care provider if you have ever been abused or do not feel safe at home. This information is not intended to replace advice given to you by your health care provider. Make sure you discuss any questions you have with your health care provider. Document Released: 11/08/2010 Document Revised: 10/01/2015 Document Reviewed: 01/27/2015 Elsevier Interactive Patient Education  2019 [employer].

## 2018-07-30 ENCOUNTER — Encounter: Payer: BLUE CROSS/BLUE SHIELD | Admitting: Family Medicine

## 2018-08-15 ENCOUNTER — Encounter: Payer: Self-pay | Admitting: Family Medicine

## 2018-08-26 ENCOUNTER — Other Ambulatory Visit: Payer: Self-pay | Admitting: Family Medicine

## 2018-08-26 DIAGNOSIS — F4323 Adjustment disorder with mixed anxiety and depressed mood: Secondary | ICD-10-CM

## 2018-08-26 DIAGNOSIS — M545 Low back pain, unspecified: Secondary | ICD-10-CM

## 2018-08-26 DIAGNOSIS — G8929 Other chronic pain: Secondary | ICD-10-CM

## 2018-08-27 MED ORDER — ALPRAZOLAM 1 MG PO TABS: ORAL_TABLET | ORAL | 0 refills | Status: DC

## 2018-08-27 MED ORDER — CYCLOBENZAPRINE HCL 10 MG PO TABS: 5.0000 mg | ORAL_TABLET | Freq: Every evening | ORAL | 1 refills | Status: DC | PRN

## 2018-08-27 NOTE — Telephone Encounter (Signed)
Cyclobenzaprine last filled 07-13-18 #30 Alprazolam last filled 07-23-18 #15 Last OV 07-25-18  Next OV 10-26-18 [facility]

## 2018-09-24 ENCOUNTER — Encounter: Payer: Self-pay | Admitting: Family Medicine

## 2018-09-24 ENCOUNTER — Ambulatory Visit: Payer: Self-pay | Admitting: *Deleted

## 2018-09-24 DIAGNOSIS — Z20822 Contact with and (suspected) exposure to covid-19: Secondary | ICD-10-CM

## 2018-09-24 NOTE — Telephone Encounter (Signed)
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 She is currently having fever and chills.   Insurance- Transport planner, member [employer]- [phone], Employer- [employer] # [id number], eligibility verified   Thank you,  Olean Ree, FNP-BC    Pt notified of the request for testing. Appointment scheduled for 09/25/18 at 11:30 at [facility] site in [place]. Pt advised of wearing a mask, staying in the car with the windows rolled up until she is ready to be tested. Pt voiced understanding.

## 2018-09-25 ENCOUNTER — Other Ambulatory Visit: Payer: Self-pay

## 2018-09-25 DIAGNOSIS — R6889 Other general symptoms and signs: Secondary | ICD-10-CM | POA: Diagnosis not present

## 2018-09-25 DIAGNOSIS — Z20822 Contact with and (suspected) exposure to covid-19: Secondary | ICD-10-CM

## 2018-09-27 LAB — NOVEL CORONAVIRUS, NAA: SARS-CoV-2, NAA: NOT DETECTED

## 2018-09-28 ENCOUNTER — Encounter: Payer: Self-pay | Admitting: Family Medicine

## 2018-10-06 ENCOUNTER — Encounter: Payer: Self-pay | Admitting: Family Medicine

## 2018-10-15 ENCOUNTER — Other Ambulatory Visit: Payer: Self-pay | Admitting: Family Medicine

## 2018-10-15 DIAGNOSIS — F4323 Adjustment disorder with mixed anxiety and depressed mood: Secondary | ICD-10-CM

## 2018-10-15 MED ORDER — ALPRAZOLAM 1 MG PO TABS: ORAL_TABLET | ORAL | 0 refills | Status: DC

## 2018-10-15 NOTE — Telephone Encounter (Signed)
Last office visit 07/25/2018 for CPE.  Last refilled 08/27/2018 for #30 with no refills.  Next Appt; 10/26/2018 for 3 month follow up.

## 2018-10-26 ENCOUNTER — Other Ambulatory Visit: Payer: Self-pay

## 2018-10-26 ENCOUNTER — Ambulatory Visit (INDEPENDENT_AMBULATORY_CARE_PROVIDER_SITE_OTHER): Payer: PRIVATE HEALTH INSURANCE | Admitting: Family Medicine

## 2018-10-26 ENCOUNTER — Encounter: Payer: Self-pay | Admitting: Family Medicine

## 2018-10-26 DIAGNOSIS — F4323 Adjustment disorder with mixed anxiety and depressed mood: Secondary | ICD-10-CM | POA: Diagnosis not present

## 2018-10-26 NOTE — Progress Notes (Signed)
Virtual Visit via Video Note  I connected with Tami Foster on 10/26/18 at 12:00 PM EDT by a video enabled telemedicine application and verified that I am speaking with the correct person using two identifiers.  Location: Patient: at her home Provider: [place]   I discussed the limitations of evaluation and management by telemedicine and the availability of in person appointments. The patient expressed understanding and agreed to proceed.  History of Present Illness: This is a 38 yo female who presents today for virtual visit to discuss anxiety and depression. She has been on multiple medications, most recently she was sertraline 100 mg but stopped due to it making her feel funny/wierd. She reports that side effects have resolved since stopping. She takes occasional alprazolam 1/2 to 1 mg 1-2 times per day prn. 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 She currently finds her job to be very stressful with difficult working conditions (long shifts, multiple days working in a row, PPE, no air-conditioning with COVID patient transport, concern for exposure).  She reports that her home life is good except that she has difficulty sleeping.  Both difficulty going to sleep and staying asleep in part due to her 19-year-old son.  She feels that her energy level is low and that she lacks motivation to do the things she needs to do around her home. She has been on multiple medications over the last several years for anxiety and depression including bupropion, duloxetine, venlafaxine.  She is also seen psychiatry who made some adjustments to her medications that she did not feel were helpful.  She does not wish to go back to psychiatry at this time.  She has occasional episodes of palpitations, when she checks her blood pressure and heart rate at work they are generally normal.  Her heart rate does tend to run in the upper 80s to 90s.  Occasionally she will have an  elevated blood pressure but it does not seem to persist.  Past Medical History:  Diagnosis Date  . Abnormal Pap smear   . Anxiety   . Depression   . GERD (gastroesophageal reflux disease)   . Hypertension    Past Surgical History:  Procedure Laterality Date  . LEEP    . PERIURETHRAL ABSCESS DRAINAGE     abscess removed from tonsils. Tonsils not removed  . WISDOM TOOTH EXTRACTION     Family History  Problem Relation Age of Onset  . Cancer Mother        bladder  . Hypertension Mother   . Emphysema Father   . Hypertension Father   . Cancer Father 87       Colon  . Diabetes Paternal Grandmother   . Other Neg Hx    Social History   Tobacco Use  . Smoking status: Current Every Day Smoker    Packs/day: 0.50    Years: 14.00    Pack years: 7.00    Types: Cigarettes    Last attempt to quit: 06/09/2017    Years since quitting: 1.3  . Smokeless tobacco: Never Used  Substance Use Topics  . Alcohol use: Yes    Alcohol/week: 0.0 standard drinks    Comment: Rare  . Drug use: No      Observations/Objective: Patient is alert and answers questions appropriately.  Visible skin is unremarkable.  She is normally conversive without shortness of breath.  No witnessed wheeze or cough.  Her mood and affect are appropriate. There  were no vitals taken for this visit. Wt Readings from Last 3 Encounters:  07/25/18 158 lb 12.8 oz (72 kg)  03/07/18 163 lb (73.9 kg)  01/26/18 164 lb 3.2 oz (74.5 kg)   BP Readings from Last 3 Encounters:  07/25/18 122/78  03/07/18 112/70  01/26/18 120/82    Assessment and Plan: 1. Adjustment disorder with mixed anxiety and depressed mood - reviewed patient's prior antidepressant therapy with her and she would like to re-try venlafaxine. We were unable to determine why she came off it previously.  - Follow up in 8 weeks - venlafaxine XR (EFFEXOR-XR) 37.5 MG 24 hr capsule; Take one tablet by mouth daily with breakfast x 1 week, then increase to 2 tablets  with breakfast.  Dispense: 60 capsule; Refill: 1   Tami Reeeborah Grisel Blumenstock, FNP-BC  [facility], [facility]  11/01/2018 8:14 AM  Follow Up Instructions:    I discussed the assessment and treatment plan with the patient. The patient was provided an opportunity to ask questions and all were answered. The patient agreed with the plan and demonstrated an understanding of the instructions.   The patient was advised to call back or seek an in-person evaluation if the symptoms worsen or if the condition fails to improve as anticipated.    Emi Belfasteborah B Tyara Dassow, FNP

## 2018-10-31 ENCOUNTER — Encounter: Payer: Self-pay | Admitting: Family Medicine

## 2018-11-01 ENCOUNTER — Encounter: Payer: Self-pay | Admitting: Family Medicine

## 2018-11-01 MED ORDER — VENLAFAXINE HCL ER 37.5 MG PO CP24: ORAL_CAPSULE | ORAL | 1 refills | Status: DC

## 2018-11-23 ENCOUNTER — Other Ambulatory Visit: Payer: Self-pay | Admitting: Family Medicine

## 2018-11-23 DIAGNOSIS — F4323 Adjustment disorder with mixed anxiety and depressed mood: Secondary | ICD-10-CM

## 2018-12-04 ENCOUNTER — Other Ambulatory Visit: Payer: Self-pay | Admitting: Family Medicine

## 2018-12-04 DIAGNOSIS — F4323 Adjustment disorder with mixed anxiety and depressed mood: Secondary | ICD-10-CM

## 2018-12-05 MED ORDER — ALPRAZOLAM 1 MG PO TABS: ORAL_TABLET | ORAL | 0 refills | Status: DC

## 2018-12-05 NOTE — Telephone Encounter (Signed)
Last office visit 10/26/2018 for depression/anxiety.  Last refilled 10/15/2018 for #30 with no refills.  No future appointments.

## 2018-12-05 NOTE — Telephone Encounter (Signed)
Noted. Reviewed Debbies last note regarding anxiety and depression. Refill sent to pharmacy.

## 2018-12-27 ENCOUNTER — Other Ambulatory Visit: Payer: Self-pay | Admitting: Family Medicine

## 2018-12-27 DIAGNOSIS — F4323 Adjustment disorder with mixed anxiety and depressed mood: Secondary | ICD-10-CM

## 2018-12-28 NOTE — Telephone Encounter (Signed)
Last refill and OV - 10/26/2018 - venlafaxine XR (EFFEXOR-XR) 37.5 MG 24 hr capsule; Take one tablet by mouth daily with breakfast x 1 week, then increase to 2 tablets with breakfast.  Dispense: 60 capsule; Refill: 1  Advised to follow up in 8 weeks - No appt on file

## 2019-01-11 ENCOUNTER — Other Ambulatory Visit: Payer: Self-pay | Admitting: Family Medicine

## 2019-01-11 DIAGNOSIS — F4323 Adjustment disorder with mixed anxiety and depressed mood: Secondary | ICD-10-CM

## 2019-01-20 ENCOUNTER — Encounter: Payer: Self-pay | Admitting: Family Medicine

## 2019-01-21 ENCOUNTER — Encounter: Payer: Self-pay | Admitting: Family Medicine

## 2019-01-21 ENCOUNTER — Other Ambulatory Visit: Payer: Self-pay | Admitting: Family Medicine

## 2019-01-21 MED ORDER — DOXYCYCLINE HYCLATE 100 MG PO CAPS: 100.0000 mg | ORAL_CAPSULE | Freq: Two times a day (BID) | ORAL | 0 refills | Status: DC

## 2019-01-25 ENCOUNTER — Other Ambulatory Visit: Payer: Self-pay

## 2019-01-25 ENCOUNTER — Other Ambulatory Visit: Payer: Self-pay | Admitting: Family Medicine

## 2019-01-25 ENCOUNTER — Encounter: Payer: Self-pay | Admitting: Family Medicine

## 2019-01-25 DIAGNOSIS — F4323 Adjustment disorder with mixed anxiety and depressed mood: Secondary | ICD-10-CM

## 2019-01-25 MED ORDER — CEPHALEXIN 500 MG PO CAPS: 500.0000 mg | ORAL_CAPSULE | Freq: Three times a day (TID) | ORAL | 0 refills | Status: DC

## 2019-01-25 MED ORDER — ALPRAZOLAM 1 MG PO TABS: ORAL_TABLET | ORAL | 0 refills | Status: DC

## 2019-01-25 NOTE — Telephone Encounter (Signed)
Last office visit 10/26/2018 for Anxiety and depressed mood.  Last refilled 12/05/2018 for #30 with no refills.  No future appointments.

## 2019-01-29 ENCOUNTER — Encounter: Payer: Self-pay | Admitting: Family Medicine

## 2019-02-01 ENCOUNTER — Other Ambulatory Visit: Payer: Self-pay | Admitting: Family Medicine

## 2019-02-01 DIAGNOSIS — R21 Rash and other nonspecific skin eruption: Secondary | ICD-10-CM

## 2019-02-01 MED ORDER — PREDNISONE 20 MG PO TABS: 20.0000 mg | ORAL_TABLET | Freq: Every day | ORAL | 0 refills | Status: DC

## 2019-02-01 NOTE — Progress Notes (Signed)
See mychart message regarding rash.

## 2019-02-20 ENCOUNTER — Encounter: Payer: Self-pay | Admitting: Family Medicine

## 2019-03-06 ENCOUNTER — Other Ambulatory Visit: Payer: Self-pay | Admitting: Family Medicine

## 2019-03-06 DIAGNOSIS — F4323 Adjustment disorder with mixed anxiety and depressed mood: Secondary | ICD-10-CM

## 2019-03-06 MED ORDER — ALPRAZOLAM 1 MG PO TABS: ORAL_TABLET | ORAL | 0 refills | Status: DC

## 2019-04-18 ENCOUNTER — Other Ambulatory Visit: Payer: Self-pay | Admitting: Family Medicine

## 2019-04-18 DIAGNOSIS — F4323 Adjustment disorder with mixed anxiety and depressed mood: Secondary | ICD-10-CM

## 2019-04-18 NOTE — Telephone Encounter (Signed)
Electronic refill request. Alprazolam Last office visit:    

## 2019-04-19 ENCOUNTER — Encounter: Payer: Self-pay | Admitting: Family Medicine

## 2019-04-19 MED ORDER — ALPRAZOLAM 1 MG PO TABS: ORAL_TABLET | ORAL | 0 refills | Status: DC

## 2019-04-19 NOTE — Telephone Encounter (Signed)
Last OV 10/26/2018 for Anxiety Last refilled 03/06/2019 #30 x zero refills Pt advised last OV to follow up in 8 weeks, no appt ever scheduled. Pt was Due for OV mid August to September.   LM to call back - NEEDS OV  Please advise if okay to refill for 30 days until visit can be scheduled.   Jackelyn Poling is not in office this week)

## 2019-04-19 NOTE — Telephone Encounter (Signed)
Sent. Had prev been filled by PCP mult times, will route to PCP about follow up.  Thanks.

## 2019-05-22 ENCOUNTER — Ambulatory Visit: Payer: Self-pay | Admitting: Family Medicine

## 2019-05-22 ENCOUNTER — Encounter: Payer: Self-pay | Admitting: Family Medicine

## 2019-05-22 ENCOUNTER — Other Ambulatory Visit: Payer: Self-pay

## 2019-05-22 VITALS — BP 138/96 | HR 108 | Temp 98.0°F | Ht 61.0 in | Wt 169.8 lb

## 2019-05-22 DIAGNOSIS — G8929 Other chronic pain: Secondary | ICD-10-CM

## 2019-05-22 DIAGNOSIS — M545 Low back pain, unspecified: Secondary | ICD-10-CM

## 2019-05-22 DIAGNOSIS — E669 Obesity, unspecified: Secondary | ICD-10-CM

## 2019-05-22 DIAGNOSIS — E66811 Obesity, class 1: Secondary | ICD-10-CM

## 2019-05-22 DIAGNOSIS — M2540 Effusion, unspecified joint: Secondary | ICD-10-CM

## 2019-05-22 LAB — COMPREHENSIVE METABOLIC PANEL
ALT: 21 U/L (ref 0–35)
AST: 20 U/L (ref 0–37)
Albumin: 4.5 g/dL (ref 3.5–5.2)
Alkaline Phosphatase: 83 U/L (ref 39–117)
BUN: 14 mg/dL (ref 6–23)
CO2: 25 mEq/L (ref 19–32)
Calcium: 9.4 mg/dL (ref 8.4–10.5)
Chloride: 104 mEq/L (ref 96–112)
Creatinine, Ser: 0.61 mg/dL (ref 0.40–1.20)
GFR: 110.79 mL/min (ref >60.00)
Glucose, Bld: 103 mg/dL — ABNORMAL HIGH (ref 70–99)
Potassium: 4 mEq/L (ref 3.5–5.1)
Sodium: 137 mEq/L (ref 135–145)
Total Bilirubin: 0.7 mg/dL (ref 0.2–1.2)
Total Protein: 7.2 g/dL (ref 6.0–8.3)

## 2019-05-22 LAB — CBC WITH DIFFERENTIAL/PLATELET
Basophils Absolute: 0 10*3/uL (ref 0.0–0.1)
Basophils Relative: 0.4 % (ref 0.0–3.0)
Eosinophils Absolute: 0.1 10*3/uL (ref 0.0–0.7)
Eosinophils Relative: 0.8 % (ref 0.0–5.0)
HCT: 44.4 % (ref 36.0–46.0)
Hemoglobin: 14.9 g/dL (ref 12.0–15.0)
Lymphocytes Relative: 26 % (ref 12.0–46.0)
Lymphs Abs: 2.4 10*3/uL (ref 0.7–4.0)
MCHC: 33.5 g/dL (ref 30.0–36.0)
MCV: 93.7 fl (ref 78.0–100.0)
Monocytes Absolute: 0.7 10*3/uL (ref 0.1–1.0)
Monocytes Relative: 7.6 % (ref 3.0–12.0)
Neutro Abs: 6 10*3/uL (ref 1.4–7.7)
Neutrophils Relative %: 65.2 % (ref 43.0–77.0)
Platelets: 204 10*3/uL (ref 150.0–400.0)
RBC: 4.73 Mil/uL (ref 3.87–5.11)
RDW: 13.4 % (ref 11.5–15.5)
WBC: 9.2 10*3/uL (ref 4.0–10.5)

## 2019-05-22 LAB — TSH: TSH: 0.46 u[IU]/mL (ref 0.35–4.50)

## 2019-05-22 LAB — HIGH SENSITIVITY CRP: CRP, High Sensitivity: 1.38 mg/L (ref 0.000–5.000)

## 2019-05-22 NOTE — Patient Instructions (Addendum)
I will notify you of labs, can take ibuprofen 2-3 tabs every 8 to 12 hours or alleve 2 tabs every 12 hours Can also try over the counter thumb spica splints- [employer]  Feet up the wall- look on Youtube  A resource that I like is www.dietdoctor.com/diabetes/diet  Here are some guidelines to help you with meal planning -  Avoid all processed and packaged foods (bread, pasta, crackers, chips, etc) and beverages containing calories.  Avoid added sugars and excessive natural sugars.  Attention to how you feel if you consume artificial sweeteners.  Do they make you more hungry or raise your blood sugar?  With every meal and snack, aim to get 20 g of protein (3 ounces of meat, 4 ounces of fish, 3 eggs, protein powder, 1 cup [place] yogurt, 1 cup cottage cheese, etc.)  Increase fiber in the form of non-starchy vegetables.  These help you feel full with very little carbohydrates and are good for gut health.  Eat 1 serving healthy carb per meal- 1/2 cup brown rice, beans, potato, corn- pay attention to whether or not this significantly raises your blood sugar. If it does, reduce the frequency you consume these.   Eat 2-3 servings of lower sugar fruits daily.  This includes berries, apples, oranges, peaches, pears, one half banana.  Have small amounts of good fats such as avocado, nuts, olive oil, nut butters, olives.  Add a little cheese to your salads to make them tasty.    Back Exercises These exercises help to make your trunk and back strong. They also help to keep the lower back flexible. Doing these exercises can help to prevent back pain or lessen existing pain.  If you have back pain, try to do these exercises 2-3 times each day or as told by your doctor.  As you get better, do the exercises once each day. Repeat the exercises more often as told by your doctor.  To stop back pain from coming back, do the exercises once each day, or as told by your doctor. Exercises Single knee to chest Do  these steps 3-5 times in a row for each leg: 1. Lie on your back on a firm bed or the floor with your legs stretched out. 2. Bring one knee to your chest. 3. Grab your knee or thigh with both hands and hold them it in place. 4. Pull on your knee until you feel a gentle stretch in your lower back or buttocks. 5. Keep doing the stretch for 10-30 seconds. 6. Slowly let go of your leg and straighten it. Pelvic tilt Do these steps 5-10 times in a row: 1. Lie on your back on a firm bed or the floor with your legs stretched out. 2. Bend your knees so they point up to the ceiling. Your feet should be flat on the floor. 3. Tighten your lower belly (abdomen) muscles to press your lower back against the floor. This will make your tailbone point up to the ceiling instead of pointing down to your feet or the floor. 4. Stay in this position for 5-10 seconds while you gently tighten your muscles and breathe evenly. Cat-cow Do these steps until your lower back bends more easily: 1. Get on your hands and knees on a firm surface. Keep your hands under your shoulders, and keep your knees under your hips. You may put padding under your knees. 2. Let your head hang down toward your chest. Tighten (contract) the muscles in your belly. Point your  tailbone toward the floor so your lower back becomes rounded like the back of a cat. 3. Stay in this position for 5 seconds. 4. Slowly lift your head. Let the muscles of your belly relax. Point your tailbone up toward the ceiling so your back forms a sagging arch like the back of a cow. 5. Stay in this position for 5 seconds.  Press-ups Do these steps 5-10 times in a row: 1. Lie on your belly (face-down) on the floor. 2. Place your hands near your head, about shoulder-width apart. 3. While you keep your back relaxed and keep your hips on the floor, slowly straighten your arms to raise the top half of your body and lift your shoulders. Do not use your back muscles. You may  change where you place your hands in order to make yourself more comfortable. 4. Stay in this position for 5 seconds. 5. Slowly return to lying flat on the floor.  Bridges Do these steps 10 times in a row: 1. Lie on your back on a firm surface. 2. Bend your knees so they point up to the ceiling. Your feet should be flat on the floor. Your arms should be flat at your sides, next to your body. 3. Tighten your butt muscles and lift your butt off the floor until your waist is almost as high as your knees. If you do not feel the muscles working in your butt and the back of your thighs, slide your feet 1-2 inches farther away from your butt. 4. Stay in this position for 3-5 seconds. 5. Slowly lower your butt to the floor, and let your butt muscles relax. If this exercise is too easy, try doing it with your arms crossed over your chest. Belly crunches Do these steps 5-10 times in a row: 1. Lie on your back on a firm bed or the floor with your legs stretched out. 2. Bend your knees so they point up to the ceiling. Your feet should be flat on the floor. 3. Cross your arms over your chest. 4. Tip your chin a little bit toward your chest but do not bend your neck. 5. Tighten your belly muscles and slowly raise your chest just enough to lift your shoulder blades a tiny bit off of the floor. Avoid raising your body higher than that, because it can put too much stress on your low back. 6. Slowly lower your chest and your head to the floor. Back lifts Do these steps 5-10 times in a row: 1. Lie on your belly (face-down) with your arms at your sides, and rest your forehead on the floor. 2. Tighten the muscles in your legs and your butt. 3. Slowly lift your chest off of the floor while you keep your hips on the floor. Keep the back of your head in line with the curve in your back. Look at the floor while you do this. 4. Stay in this position for 3-5 seconds. 5. Slowly lower your chest and your face to the  floor. Contact a doctor if:  Your back pain gets a lot worse when you do an exercise.  Your back pain does not get better 2 hours after you exercise. If you have any of these problems, stop doing the exercises. Do not do them again unless your doctor says it is okay. Get help right away if:  You have sudden, very bad back pain. If this happens, stop doing the exercises. Do not do them again unless your doctor  says it is okay. This information is not intended to replace advice given to you by your health care provider. Make sure you discuss any questions you have with your health care provider. Document Revised: 01/18/2018 Document Reviewed: 01/18/2018 Elsevier Patient Education  2020 [employer].

## 2019-05-22 NOTE — Progress Notes (Signed)
Subjective:    Patient ID: Tami Foster, female    DOB: 02-Feb-1981, 39 y.o.   MRN: [id number]  HPI Chief Complaint  Patient presents with  . Joint Pain    pt c/o joint pain x 1 week in hands and has moved to different joints in her body now. Pt states that her joints feel swollen and she finds it hard to make a fist. Pt c/o back pain x a few years.    This is a 39 yo female who presents today with above cc. 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 She lost her health insurance. Her husband had been laid off due to pandemic, but recently was rehired.   Acute onset of hand, wrist and knee pain. Feels "bruised." Can't grip like she normally can. Some improvement with heat/ hot shower. Has been taking tylenol arthritis without relief. Advil pm occasionally without relief. Not getting worse, not getting better. Pain at night while sleeping.   Back hurts all of the time x several years. Sleeps with feet elevated which helps some, worse in am, better when she gets ups and moves around.   Obesity- active at work. Poor diet while working, eats whatever she can find at the gas station. Tried watching her diet, decreased sugar, starches but only lost 2 pounds. Felt better but was discouraged. Poor sleep. Her 68 yo father with cancer lives with them and she is always worried about his health.   Past Medical History:  Diagnosis Date  . Abnormal Pap smear   . Anxiety   . Depression   . GERD (gastroesophageal reflux disease)   . Hypertension    Past Surgical History:  Procedure Laterality Date  . LEEP    . PERIURETHRAL ABSCESS DRAINAGE     abscess removed from tonsils. Tonsils not removed  . WISDOM TOOTH EXTRACTION     Family History  Problem Relation Age of Onset  . Cancer Mother        bladder  . Hypertension Mother   . Emphysema Father   . Hypertension Father   . Cancer Father 7       Colon  . Diabetes  Paternal Grandmother   . Other Neg Hx    Social History   Tobacco Use  . Smoking status: Current Every Day Smoker    Packs/day: 0.50    Years: 14.00    Pack years: 7.00    Types: Cigarettes    Last attempt to quit: 06/09/2017    Years since quitting: 1.9  . Smokeless tobacco: Never Used  Substance Use Topics  . Alcohol use: Yes    Alcohol/week: 0.0 standard drinks    Comment: Rare  . Drug use: No      Review of Systems Per HPI    Objective:   Physical Exam Vitals reviewed.  Constitutional:      General: She is not in acute distress.    Appearance: Normal appearance. She is obese. She is not ill-appearing, toxic-appearing or diaphoretic.  HENT:     Head: Atraumatic.  Cardiovascular:     Rate and Rhythm: Normal rate and regular rhythm.     Heart sounds: Normal heart sounds.  Pulmonary:     Effort: Pulmonary effort is normal.     Breath sounds: Normal breath sounds.  Musculoskeletal:        General: Swelling (mild bilateral fingers, no effusion. ) present.  Comments: Normal gait, normal strength, back without point tenderness, normal ROM.   Skin:    General: Skin is warm and dry.  Neurological:     Mental Status: She is alert and oriented to person, place, and time.  Psychiatric:        Mood and Affect: Mood normal.        Behavior: Behavior normal.        Thought Content: Thought content normal.        Judgment: Judgment normal.       BP (!) 138/96 (BP Location: Left Arm, Patient Position: Sitting, Cuff Size: Normal)   Pulse (!) 108   Temp 98 F (36.7 C) (Temporal)   Ht 5\' 1"  (1.549 m)   Wt 169 lb 12.8 oz (77 kg)   SpO2 98%   BMI 32.08 kg/m  Wt Readings from Last 3 Encounters:  05/22/19 169 lb 12.8 oz (77 kg)  07/25/18 158 lb 12.8 oz (72 kg)  03/07/18 163 lb (73.9 kg)       Assessment & Plan:  1. Joint swelling - patient concerned about autoimmune process, will check labs - encouraged low inflammation diet - CRP High sensitivity -  Rheumatoid factor; Future - TSH - CBC with Differential - Comprehensive metabolic panel  2. Chronic midline low back pain without sciatica - encouraged daily walking, provided information about low back exercises - would likely benefit from physical therapy, will consider once she has insurance coverage  3. Obesity, Class I, BMI 30-34.9 - provided written and verbal information regarding balanced diet  Needs CPE with pap, reminded patient, she will schedule when she has insurance coverage  This visit occurred during the SARS-CoV-2 public health emergency.  Safety protocols were in place, including screening questions prior to the visit, additional usage of staff PPE, and extensive cleaning of exam room while observing appropriate contact time as indicated for disinfecting solutions.      03/09/18, FNP-BC  [facility] at [facility], [facility]  05/22/2019 8:25 PM

## 2019-06-07 ENCOUNTER — Encounter: Payer: Self-pay | Admitting: Family Medicine

## 2019-06-11 ENCOUNTER — Other Ambulatory Visit: Payer: Self-pay | Admitting: Family Medicine

## 2019-06-11 DIAGNOSIS — F4323 Adjustment disorder with mixed anxiety and depressed mood: Secondary | ICD-10-CM

## 2019-06-12 MED ORDER — ALPRAZOLAM 1 MG PO TABS: ORAL_TABLET | ORAL | 0 refills | Status: DC

## 2019-07-29 ENCOUNTER — Other Ambulatory Visit: Payer: Self-pay | Admitting: Family Medicine

## 2019-07-29 DIAGNOSIS — F4323 Adjustment disorder with mixed anxiety and depressed mood: Secondary | ICD-10-CM

## 2019-07-30 NOTE — Telephone Encounter (Signed)
Xanax last refilled 06/12/19 #30 x 0 refills Effexor 75mg  last refilled 12/28/18 #30 x 0 refills  Last OV 05/22/19  Please advise, thanks.

## 2019-07-30 NOTE — Telephone Encounter (Signed)
Message left for patient to return my call.  

## 2019-07-30 NOTE — Telephone Encounter (Signed)
Please call patient and see if she is taking venlafaxine? Also, she needs to schedule office visit for Pap/ cpe.

## 2019-08-02 ENCOUNTER — Other Ambulatory Visit: Payer: Self-pay | Admitting: Family Medicine

## 2019-08-02 DIAGNOSIS — F4323 Adjustment disorder with mixed anxiety and depressed mood: Secondary | ICD-10-CM

## 2019-08-02 MED ORDER — VENLAFAXINE HCL ER 75 MG PO CP24: 75.0000 mg | ORAL_CAPSULE | Freq: Every day | ORAL | 2 refills | Status: DC

## 2019-08-02 NOTE — Telephone Encounter (Signed)
Sent pt a mychart message as well.  

## 2019-08-04 ENCOUNTER — Other Ambulatory Visit: Payer: Self-pay | Admitting: Family Medicine

## 2019-08-04 DIAGNOSIS — F4323 Adjustment disorder with mixed anxiety and depressed mood: Secondary | ICD-10-CM

## 2019-08-05 NOTE — Telephone Encounter (Signed)
Pt aware approximate cost 175 and price list dated 12/2018.  She is aware no insurance she will get a 55% discount and we like to collect 80 deposit at time of appointment.  She is also aware any labs and pap would be extra charge

## 2019-08-05 NOTE — Telephone Encounter (Signed)
Alprazolam was last refilled on 06/12/19 for #30 with 0 refills. Patient was last seen on 05/22/19 and has no upcoming appts. Is this ok to refill?

## 2019-08-16 ENCOUNTER — Other Ambulatory Visit (HOSPITAL_COMMUNITY)
Admission: RE | Admit: 2019-08-16 | Discharge: 2019-08-16 | Disposition: A | Discharge status: Home / Self Care | Payer: Self-pay | Source: Ambulatory Visit | Attending: Family Medicine | Admitting: Family Medicine

## 2019-08-16 ENCOUNTER — Encounter: Payer: Self-pay | Admitting: Family Medicine

## 2019-08-16 ENCOUNTER — Other Ambulatory Visit: Payer: Self-pay

## 2019-08-16 ENCOUNTER — Ambulatory Visit: Payer: Self-pay | Admitting: Family Medicine

## 2019-08-16 VITALS — BP 122/82 | HR 80 | Temp 98.7°F | Ht 62.0 in | Wt 167.8 lb

## 2019-08-16 DIAGNOSIS — Z124 Encounter for screening for malignant neoplasm of cervix: Secondary | ICD-10-CM | POA: Insufficient documentation

## 2019-08-16 NOTE — Progress Notes (Addendum)
Subjective:    Patient ID: Tami Foster, female    DOB: May 23, 1980, 38 y.o.   MRN: [id number]  HPI Chief Complaint  Patient presents with  . Gynecologic Exam     This is a 39 yo female who presents today for gyn exam to follow up previously abnormal Pap. She does not currently have health insurance and requests pap only.  Has been doing ok.  Stress level continues to be high.  Her husband has found a job and she has been able to cut back on her hours some.  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  She is also heavily involved in taking care of her father who has chronic medical conditions.  Abnormal Pap-several years ago had ASCUS.  Last Pap negative, due for Pap today.  Menses irregular.  She has history of PCOS.  Tobacco abuse-she is not interested in quitting at this time.  Mood-resumed her venlafaxine 75 mg and mood has improved.  Review of Systems Occasional headaches, unchanged; occasional palpitations, no chest pain, no shortness of breath.  Occasional leg swelling    Objective:   Physical Exam Vitals reviewed.  Constitutional:      General: She is not in acute distress.    Appearance: Normal appearance. She is obese. She is not ill-appearing, toxic-appearing or diaphoretic.  HENT:     Head: Normocephalic and atraumatic.  Cardiovascular:     Rate and Rhythm: Normal rate and regular rhythm.  Pulmonary:     Effort: Pulmonary effort is normal.  Genitourinary:    General: Normal vulva.     Exam position: Supine.     Pubic Area: No rash.      Labia:        Right: No rash, tenderness, lesion or injury.        Left: No rash, tenderness, lesion or injury.      Urethra: No prolapse, urethral pain, urethral swelling or urethral lesion.     Vagina: No vaginal discharge.     Cervix: No discharge, friability, lesion, erythema or cervical bleeding.  Neurological:     Mental Status:  She is alert.       BP 122/82   Pulse 80   Temp 98.7 F (37.1 C) (Tympanic)   Ht 5\' 2"  (1.575 m)   Wt 167 lb 12.8 oz (76.1 kg)   SpO2 96%   BMI 30.69 kg/m  Wt Readings from Last 3 Encounters:  08/16/19 167 lb 12.8 oz (76.1 kg)  05/22/19 169 lb 12.8 oz (77 kg)  07/25/18 158 lb 12.8 oz (72 kg)   Depression screen [facility] 2/9 08/16/2019 07/25/2018 09/22/2017 07/20/2016 10/13/2015  Decreased Interest 1 2 1  0 0  Down, Depressed, Hopeless 1 3 1  0 0  PHQ - 2 Score 2 5 2  0 0  Altered sleeping - 2 1 - -  Tired, decreased energy - 2 2 - -  Change in appetite - 0 0 - -  Feeling bad or failure about yourself  - 1 0 - -  Trouble concentrating - 2 1 - -  Moving slowly or fidgety/restless - 1 0 - -  Suicidal thoughts - 0 0 - -  PHQ-9 Score - 13 6 - -  Difficult doing work/chores - Very difficult - - -       Assessment & Plan:  1. Screening for cervical cancer - Cytology - PAP([facility])  This visit occurred during the SARS-CoV-2 public health emergency.  Safety protocols were in place, including screening questions prior to the visit, additional usage of staff PPE, and extensive cleaning of exam room while observing appropriate contact time as indicated for disinfecting solutions.    Olean Ree, FNP-BC   [facility] at [facility], [facility]  08/18/2019 8:46 AM

## 2019-08-16 NOTE — Patient Instructions (Signed)
Good to see you today  I will notify you of pap results follow up in 4-6 months, sooner if needed

## 2019-08-18 ENCOUNTER — Encounter: Payer: Self-pay | Admitting: Family Medicine

## 2019-08-20 LAB — CYTOLOGY - PAP
Comment: NEGATIVE
Diagnosis: NEGATIVE
High risk HPV: NEGATIVE

## 2019-08-26 ENCOUNTER — Other Ambulatory Visit: Payer: Self-pay | Admitting: Family Medicine

## 2019-08-26 DIAGNOSIS — F4323 Adjustment disorder with mixed anxiety and depressed mood: Secondary | ICD-10-CM

## 2019-08-28 NOTE — Telephone Encounter (Signed)
Pharmacy is requesting a 90 day supply. Tami Foster, is this ok?

## 2019-09-20 ENCOUNTER — Other Ambulatory Visit: Payer: Self-pay | Admitting: Family Medicine

## 2019-09-20 DIAGNOSIS — F4323 Adjustment disorder with mixed anxiety and depressed mood: Secondary | ICD-10-CM

## 2019-09-20 MED ORDER — ALPRAZOLAM 1 MG PO TABS: 1.0000 mg | ORAL_TABLET | Freq: Every day | ORAL | 0 refills | Status: DC | PRN

## 2019-10-19 ENCOUNTER — Other Ambulatory Visit: Payer: Self-pay | Admitting: Family Medicine

## 2019-10-19 DIAGNOSIS — F4323 Adjustment disorder with mixed anxiety and depressed mood: Secondary | ICD-10-CM

## 2019-10-19 DIAGNOSIS — I10 Essential (primary) hypertension: Secondary | ICD-10-CM

## 2019-11-04 ENCOUNTER — Other Ambulatory Visit: Payer: Self-pay | Admitting: Family Medicine

## 2019-11-04 DIAGNOSIS — F4323 Adjustment disorder with mixed anxiety and depressed mood: Secondary | ICD-10-CM

## 2019-11-04 MED ORDER — ALPRAZOLAM 1 MG PO TABS: 1.0000 mg | ORAL_TABLET | Freq: Every day | ORAL | 0 refills | Status: DC | PRN

## 2019-11-04 NOTE — Telephone Encounter (Signed)
Please advise, thanks.

## 2019-11-04 NOTE — Telephone Encounter (Signed)
Last refilled 09/20/19 #30 x 0 refills Upcoming OV With Family Medicine Emi Belfast, FNP) 02/14/2020 at 11:15 AM  Please advise, thanks.

## 2019-11-13 ENCOUNTER — Encounter: Payer: Self-pay | Admitting: Family Medicine

## 2019-11-15 ENCOUNTER — Other Ambulatory Visit: Payer: Self-pay | Admitting: Family Medicine

## 2019-11-15 DIAGNOSIS — M545 Low back pain, unspecified: Secondary | ICD-10-CM

## 2019-11-15 MED ORDER — METAXALONE 800 MG PO TABS: 800.0000 mg | ORAL_TABLET | Freq: Three times a day (TID) | ORAL | 0 refills | Status: DC | PRN

## 2019-11-21 ENCOUNTER — Other Ambulatory Visit: Payer: Self-pay | Admitting: Family Medicine

## 2019-11-21 DIAGNOSIS — F4323 Adjustment disorder with mixed anxiety and depressed mood: Secondary | ICD-10-CM

## 2019-12-18 ENCOUNTER — Other Ambulatory Visit: Payer: Self-pay | Admitting: Family Medicine

## 2019-12-18 DIAGNOSIS — F4323 Adjustment disorder with mixed anxiety and depressed mood: Secondary | ICD-10-CM

## 2019-12-18 MED ORDER — ALPRAZOLAM 1 MG PO TABS: 1.0000 mg | ORAL_TABLET | Freq: Every day | ORAL | 0 refills | Status: DC | PRN

## 2019-12-18 NOTE — Telephone Encounter (Signed)
Last written 11-04-19 #30 Last OV 08-16-19 Next OV 02-14-20 [facility]

## 2020-02-02 ENCOUNTER — Other Ambulatory Visit: Payer: Self-pay | Admitting: Family Medicine

## 2020-02-02 DIAGNOSIS — F4323 Adjustment disorder with mixed anxiety and depressed mood: Secondary | ICD-10-CM

## 2020-02-03 MED ORDER — ALPRAZOLAM 1 MG PO TABS: 1.0000 mg | ORAL_TABLET | Freq: Every day | ORAL | 0 refills | Status: DC | PRN

## 2020-02-03 NOTE — Telephone Encounter (Signed)
Last filled 12-18-19 #30 Last OV 08-16-19 Next OV 02-14-20 [facility]

## 2020-02-10 ENCOUNTER — Encounter: Payer: Self-pay | Admitting: Family Medicine

## 2020-02-14 ENCOUNTER — Encounter: Payer: Self-pay | Admitting: Family Medicine

## 2020-02-14 ENCOUNTER — Other Ambulatory Visit: Payer: Self-pay

## 2020-02-14 ENCOUNTER — Ambulatory Visit: Payer: Self-pay | Admitting: Family Medicine

## 2020-02-14 VITALS — BP 124/80 | HR 90 | Temp 97.8°F | Ht 62.0 in | Wt 172.5 lb

## 2020-02-14 DIAGNOSIS — E66811 Obesity, class 1: Secondary | ICD-10-CM

## 2020-02-14 DIAGNOSIS — Z720 Tobacco use: Secondary | ICD-10-CM

## 2020-02-14 DIAGNOSIS — F4323 Adjustment disorder with mixed anxiety and depressed mood: Secondary | ICD-10-CM

## 2020-02-14 DIAGNOSIS — E669 Obesity, unspecified: Secondary | ICD-10-CM

## 2020-02-14 DIAGNOSIS — R6884 Jaw pain: Secondary | ICD-10-CM

## 2020-02-14 NOTE — Patient Instructions (Addendum)
Podcasts- Dr. Clearnce Sorrel,   Increase your venlafaxine to two tablets at one time, let me know in 2 weeks how you are doing  Walk every day

## 2020-02-14 NOTE — Progress Notes (Signed)
Subjective:    Patient ID: Tami Foster, female    DOB: 07-01-1980, 39 y.o.   MRN: [id number]  HPI Chief Complaint  Patient presents with  . Follow-up    pap results   This is a 39 yo female who presents today with increased anxiety, jaw pain, panic attacks. Has increased anxiety with leaving the house. Continues to work as EMT, working fewer shifts, only at night. Decreased ability to focus. Worse over last 2-3 months. Decreased sleep.  Takes venlafaxine 75 xr off and on, feels like it works for [employer] then is not working.  Irritable. TAkes alprazolam 1/2 of 1 mg most nights.  This helps with sleep.  Sleep is very disrupted due to 22-year-old son, husband snoring.  Increased weight- eats 1-2 meals a day, few snacks. No fast food, cooks at home.   Tobacco abuse-continues to smoke about 1/2 pack/day, would really like to quit.  Bilateral jaw pain in front of ears.  Reports that this started recently.  She finds herself clenching her jaw during the day and suspects that she clenches and grinds during sleep.  She has braces and has upcoming orthodontic appointment.  Some relief with ibuprofen.  Review of Systems Per HPI    Objective:   Physical Exam Vitals reviewed.  Constitutional:      General: She is not in acute distress.    Appearance: Normal appearance. She is obese. She is not ill-appearing, toxic-appearing or diaphoretic.  HENT:     Head: Normocephalic and atraumatic.     Jaw: There is normal jaw occlusion. Tenderness (bilaterally) and pain on movement present. No trismus or swelling.     Mouth/Throat:     Lips: Pink.     Mouth: Mucous membranes are moist.     Pharynx: Oropharynx is clear. Uvula midline.  Cardiovascular:     Rate and Rhythm: Normal rate and regular rhythm.     Heart sounds: Normal heart sounds.  Pulmonary:     Effort: Pulmonary effort is normal.     Breath sounds: Normal breath sounds.  Musculoskeletal:     Right lower leg: No edema.     Left  lower leg: No edema.  Skin:    General: Skin is warm and dry.  Neurological:     Mental Status: She is alert and oriented to person, place, and time.  Psychiatric:        Mood and Affect: Mood normal.        Behavior: Behavior normal.        Thought Content: Thought content normal.        Judgment: Judgment normal.      BP 124/80   Pulse 90   Temp 97.8 F (36.6 C) (Temporal)   Ht 5\' 2"  (1.575 m)   Wt 172 lb 8 oz (78.2 kg)   LMP 02/11/2020 (Approximate)   SpO2 96%   BMI 31.55 kg/m  Wt Readings from Last 3 Encounters:  02/14/20 172 lb 8 oz (78.2 kg)  08/16/19 167 lb 12.8 oz (76.1 kg)  05/22/19 169 lb 12.8 oz (77 kg)       Assessment & Plan:  1. Jaw pain -Suspect teeth grinding and clenching.  She has appointment with orthodontist next week, she will ask about nightguard.  Discussed relaxation techniques, can use over-the-counter analgesics and heat.  Muscle relaxer for nighttime use prescribed - cyclobenzaprine (FLEXERIL) 10 MG tablet; Take 1 tablet (10 mg total) by mouth at bedtime as needed for muscle spasms.  Do not take with alprazolam.  Dispense: 30 tablet; Refill: 0  2. Adjustment disorder with mixed anxiety and depressed mood -Discussed nonpharmacologic interventions and will try increased dose of venlafaxine.  She will take 2 of her 75 mg extended release capsules daily. -Follow-up via MyChart in 2 to 3 weeks, sooner if worsening symptoms or intolerance of increased dose  3. Obesity, Class I, BMI 30-34.9 -Discussed role of stress and sleep in her attempts at weight loss.  She was doing better when she was walking daily and I have encouraged this. -Discussed varying her diet and times of eating, continue healthy food choices with adequate protein, vegetables and good fats  4. Tobacco abuse -If unable to quit at this time, recommended reduction in use.   Olean Ree, FNP-BC  [facility], [facility]  02/15/2020 10:51  AM

## 2020-02-15 MED ORDER — CYCLOBENZAPRINE HCL 10 MG PO TABS: 10.0000 mg | ORAL_TABLET | Freq: Every evening | ORAL | 0 refills | Status: DC | PRN

## 2020-02-19 ENCOUNTER — Encounter: Payer: Self-pay | Admitting: Family Medicine

## 2020-03-06 ENCOUNTER — Other Ambulatory Visit: Payer: Self-pay | Admitting: Family Medicine

## 2020-03-06 ENCOUNTER — Encounter: Payer: Self-pay | Admitting: Family Medicine

## 2020-03-06 DIAGNOSIS — F4323 Adjustment disorder with mixed anxiety and depressed mood: Secondary | ICD-10-CM

## 2020-03-06 MED ORDER — VENLAFAXINE HCL ER 150 MG PO CP24: 150.0000 mg | ORAL_CAPSULE | Freq: Every day | ORAL | 1 refills | Status: DC

## 2020-03-22 ENCOUNTER — Other Ambulatory Visit: Payer: Self-pay | Admitting: Family Medicine

## 2020-03-22 DIAGNOSIS — F4323 Adjustment disorder with mixed anxiety and depressed mood: Secondary | ICD-10-CM

## 2020-03-23 MED ORDER — ALPRAZOLAM 1 MG PO TABS: 1.0000 mg | ORAL_TABLET | Freq: Every day | ORAL | 0 refills | Status: DC | PRN

## 2020-03-23 NOTE — Telephone Encounter (Signed)
Last ov 02/14/2020  no upcoming appt Last refill was 01/09/2020 #30 no reills

## 2020-04-27 ENCOUNTER — Other Ambulatory Visit: Payer: Self-pay | Admitting: Family Medicine

## 2020-04-27 DIAGNOSIS — F4323 Adjustment disorder with mixed anxiety and depressed mood: Secondary | ICD-10-CM

## 2020-04-27 MED ORDER — ALPRAZOLAM 1 MG PO TABS: 1.0000 mg | ORAL_TABLET | Freq: Every day | ORAL | 1 refills | Status: DC | PRN

## 2020-04-27 NOTE — Telephone Encounter (Signed)
Last office visit 02/14/2020 for jaw pain.  Last refilled 03/23/2020 for #30 with no refills.  No future appointments.

## 2020-04-28 ENCOUNTER — Telehealth: Payer: Self-pay | Admitting: Family Medicine

## 2020-04-28 ENCOUNTER — Encounter: Payer: Self-pay | Admitting: Family Medicine

## 2020-04-28 ENCOUNTER — Other Ambulatory Visit: Payer: Self-pay | Admitting: Family Medicine

## 2020-04-28 DIAGNOSIS — F4323 Adjustment disorder with mixed anxiety and depressed mood: Secondary | ICD-10-CM

## 2020-04-28 DIAGNOSIS — I10 Essential (primary) hypertension: Secondary | ICD-10-CM

## 2020-04-28 MED ORDER — VENLAFAXINE HCL ER 75 MG PO CP24: 75.0000 mg | ORAL_CAPSULE | Freq: Every day | ORAL | 0 refills | Status: DC

## 2020-04-28 MED ORDER — METOPROLOL SUCCINATE ER 100 MG PO TB24: 50.0000 mg | ORAL_TABLET | Freq: Every day | ORAL | 0 refills | Status: DC

## 2020-04-28 NOTE — Telephone Encounter (Signed)
Patient called to notify me that she had a positive pregnancy test at [facility]. She is currently on several medications. Reviewed with patient. Will have her taper metoprolol XL 100 mg by taking 1/2 tablet daily for 2 weeks then stop. She was advised to check home bp readings and keep a log. New prescription of venlafaxine XR for 75 mg sent to patient's pharmacy for planned 2 week taper. She was advised to avoid NSAIDs, alprazolam, cyclobenzaprine. She has started prenatal vitamin. She is working on decreasing her tobacco use. She is currently trying to find ob provider. She has had some gray mucoid vaginal discharge. There is no odor. She also had a small amount of dried blood. She denies itching, burning, dysuria, hematuria, fever/ chills. She was instructed to follow up if new symptoms or unable to find provider. Instructions also sent via mychart.

## 2020-05-03 ENCOUNTER — Other Ambulatory Visit: Payer: Self-pay | Admitting: Family Medicine

## 2020-05-03 DIAGNOSIS — F4323 Adjustment disorder with mixed anxiety and depressed mood: Secondary | ICD-10-CM

## 2020-05-03 DIAGNOSIS — I10 Essential (primary) hypertension: Secondary | ICD-10-CM

## 2020-05-04 ENCOUNTER — Emergency Department
Admission: EM | Admit: 2020-05-04 | Discharge: 2020-05-05 | Disposition: A | Discharge status: Home / Self Care | Payer: Medicaid Other | Attending: Emergency Medicine | Admitting: Emergency Medicine

## 2020-05-04 ENCOUNTER — Other Ambulatory Visit: Payer: Self-pay

## 2020-05-04 DIAGNOSIS — O418X10 Other specified disorders of amniotic fluid and membranes, first trimester, not applicable or unspecified: Secondary | ICD-10-CM

## 2020-05-04 DIAGNOSIS — O99331 Smoking (tobacco) complicating pregnancy, first trimester: Secondary | ICD-10-CM | POA: Insufficient documentation

## 2020-05-04 DIAGNOSIS — Z3A08 8 weeks gestation of pregnancy: Secondary | ICD-10-CM | POA: Insufficient documentation

## 2020-05-04 DIAGNOSIS — O4690 Antepartum hemorrhage, unspecified, unspecified trimester: Secondary | ICD-10-CM

## 2020-05-04 DIAGNOSIS — O139 Gestational [pregnancy-induced] hypertension without significant proteinuria, unspecified trimester: Secondary | ICD-10-CM | POA: Diagnosis not present

## 2020-05-04 DIAGNOSIS — F17210 Nicotine dependence, cigarettes, uncomplicated: Secondary | ICD-10-CM | POA: Insufficient documentation

## 2020-05-04 DIAGNOSIS — R1032 Left lower quadrant pain: Secondary | ICD-10-CM

## 2020-05-04 DIAGNOSIS — O208 Other hemorrhage in early pregnancy: Secondary | ICD-10-CM | POA: Insufficient documentation

## 2020-05-04 DIAGNOSIS — I10 Essential (primary) hypertension: Secondary | ICD-10-CM | POA: Insufficient documentation

## 2020-05-04 DIAGNOSIS — Z79899 Other long term (current) drug therapy: Secondary | ICD-10-CM | POA: Diagnosis not present

## 2020-05-04 DIAGNOSIS — Z3A01 Less than 8 weeks gestation of pregnancy: Secondary | ICD-10-CM | POA: Diagnosis not present

## 2020-05-04 LAB — URINALYSIS, COMPLETE (UACMP) WITH MICROSCOPIC
Bilirubin Urine: NEGATIVE
Glucose, UA: NEGATIVE mg/dL
Ketones, ur: NEGATIVE mg/dL
Leukocytes,Ua: NEGATIVE
Nitrite: NEGATIVE
Protein, ur: NEGATIVE mg/dL
RBC / HPF: >50 RBC/hpf — ABNORMAL HIGH (ref 0–5)
Specific Gravity, Urine: 1.011 (ref 1.005–1.030)
pH: 6 (ref 5.0–8.0)

## 2020-05-04 LAB — CBC
HCT: 40.8 % (ref 36.0–46.0)
Hemoglobin: 13.7 g/dL (ref 12.0–15.0)
MCH: 32.2 pg (ref 26.0–34.0)
MCHC: 33.6 g/dL (ref 30.0–36.0)
MCV: 95.8 fL (ref 80.0–100.0)
Platelets: 236 10*3/uL (ref 150–400)
RBC: 4.26 MIL/uL (ref 3.87–5.11)
RDW: 12.8 % (ref 11.5–15.5)
WBC: 12 10*3/uL — ABNORMAL HIGH (ref 4.0–10.5)
nRBC: 0 % (ref 0.0–0.2)

## 2020-05-04 LAB — COMPREHENSIVE METABOLIC PANEL
ALT: 25 U/L (ref 0–44)
AST: 22 U/L (ref 15–41)
Albumin: 4.4 g/dL (ref 3.5–5.0)
Alkaline Phosphatase: 77 U/L (ref 38–126)
Anion gap: 8 (ref 5–15)
BUN: 13 mg/dL (ref 6–20)
CO2: 24 mmol/L (ref 22–32)
Calcium: 9.7 mg/dL (ref 8.9–10.3)
Chloride: 104 mmol/L (ref 98–111)
Creatinine, Ser: 0.42 mg/dL — ABNORMAL LOW (ref 0.44–1.00)
GFR, Estimated: >60 mL/min (ref >60)
Glucose, Bld: 93 mg/dL (ref 70–99)
Potassium: 3.6 mmol/L (ref 3.5–5.1)
Sodium: 136 mmol/L (ref 135–145)
Total Bilirubin: 0.4 mg/dL (ref 0.3–1.2)
Total Protein: 7.8 g/dL (ref 6.5–8.1)

## 2020-05-04 LAB — HCG, QUANTITATIVE, PREGNANCY: hCG, Beta Chain, Quant, S: 94158 m[IU]/mL — ABNORMAL HIGH (ref <5)

## 2020-05-04 LAB — POC URINE PREG, ED: Preg Test, Ur: POSITIVE — AB

## 2020-05-04 NOTE — ED Triage Notes (Addendum)
PT to ED via POV c/o bleeding while 6-[redacted]wks pregnant. Mild LLQ pain. Bleeding is scant, was brown is red today.  EDD Aug 15

## 2020-05-05 ENCOUNTER — Encounter: Payer: Self-pay | Admitting: Radiology

## 2020-05-05 ENCOUNTER — Emergency Department: Payer: Medicaid Other

## 2020-05-05 LAB — CHLAMYDIA/NGC RT PCR (ARMC ONLY)
Chlamydia Tr: NOT DETECTED
N gonorrhoeae: NOT DETECTED

## 2020-05-05 LAB — WET PREP, GENITAL
Clue Cells Wet Prep HPF POC: NONE SEEN
Sperm: NONE SEEN
Trich, Wet Prep: NONE SEEN
Yeast Wet Prep HPF POC: NONE SEEN

## 2020-05-05 LAB — ABO/RH: ABO/RH(D): B POS

## 2020-05-05 NOTE — ED Provider Notes (Signed)
[facility] Emergency Department Provider Note  ____________________________________________  Time seen: Approximately 1:54 AM  I have reviewed the triage vital signs and the nursing notes.   HISTORY  Chief Complaint Vaginal Bleeding   HPI Tami Foster is a 39 y.o. female with a history of hypertension, anxiety and depression who presents for evaluation of vaginal bleeding.  Patient had a positive pregnancy test at home.  Gestational age based on LMP is 6 to 7 weeks.  Has not established care for this pregnancy yet.  This is patient's third pregnancy.  She had a miscarriage followed by a full-term delivery.  She reports having bleeding and subchorionic hemorrhage with her second pregnancy.  She reports that initially she was spotting but had a little bit more heavy bleeding today.  She is not passing large clots.  She denies any abdominal pain but reports that she feels suprapubic pressure/pulling sensation.  No syncope, no vaginal discharge, no dysuria or hematuria.  Patient's blood type to be positive.   Past Medical History:  Diagnosis Date   Abnormal Pap smear    Anxiety    Depression    GERD (gastroesophageal reflux disease)    Hypertension     Patient Active Problem List   Diagnosis Date Noted   Adjustment disorder with mixed anxiety and depressed mood 10/08/2017   Obesity, Class I, BMI 30-34.9 10/08/2017   Chronic fatigue 10/08/2017   History of female hirsutism 11/11/2015   ASCUS favor benign 11/11/2015   Hirsutism 08/05/2014   Oligomenorrhea 08/05/2014   PCOS (polycystic ovarian syndrome) 08/05/2014   Weight gain 08/05/2014   Smoker 08/05/2014   Anxiety 05/06/2013   Postpartum depression 03/28/2013   Tobacco abuse 11/28/2012    Past Surgical History:  Procedure Laterality Date   LEEP     PERIURETHRAL ABSCESS DRAINAGE     abscess removed from tonsils. Tonsils not removed   WISDOM TOOTH EXTRACTION      Prior  to Admission medications   Medication Sig Start Date End Date Taking? Authorizing Provider  albuterol (PROVENTIL HFA;VENTOLIN HFA) 108 (90 Base) MCG/ACT inhaler Inhale 2 puffs into the lungs every 4 (four) hours as needed for wheezing or shortness of breath (cough, shortness of breath or wheezing.). 06/26/17   Emi Belfast, FNP  ipratropium (ATROVENT) 0.06 % nasal spray INSTILL 2 SPRAYS IN EACH NOSTRIL 3-4 TIMES A DAY AS NEEDED FOR RUNNY NOSE 06/21/17   [provider]  metoprolol succinate (TOPROL-XL) 100 MG 24 hr tablet Take 0.5 tablets (50 mg total) by mouth daily for 14 days. Take with or immediately following a meal. 04/28/20 05/12/20  Emi Belfast, FNP  venlafaxine XR (EFFEXOR-XR) 75 MG 24 hr capsule Take 1 capsule (75 mg total) by mouth daily with breakfast. 04/28/20 05/28/20  Emi Belfast, FNP    Allergies Patient has no known allergies.  Family History  Problem Relation Age of Onset   Cancer Mother        bladder   Hypertension Mother    Emphysema Father    Hypertension Father    Cancer Father 14       Colon   Diabetes Paternal Grandmother    Other Neg Hx     Social History Social History   Tobacco Use   Smoking status: Current Every Day Smoker    Packs/day: 0.50    Years: 14.00    Pack years: 7.00    Types: Cigarettes    Last attempt to quit: 06/09/2017  Years since quitting: 2.9   Smokeless tobacco: Never Used  Vaping Use   Vaping Use: Every day  Substance Use Topics   Alcohol use: Yes    Alcohol/week: 0.0 standard drinks    Comment: Rare   Drug use: No    Review of Systems  Constitutional: Negative for fever. Eyes: Negative for visual changes. ENT: Negative for sore throat. Neck: No neck pain  Cardiovascular: Negative for chest pain. Respiratory: Negative for shortness of breath. Gastrointestinal: Negative for abdominal pain, vomiting or diarrhea. Genitourinary: Negative for dysuria. + vaginal  bleeding Musculoskeletal: Negative for back pain. Skin: Negative for rash. Neurological: Negative for headaches, weakness or numbness. Psych: No SI or HI  ____________________________________________   PHYSICAL EXAM:  VITAL SIGNS: Vitals:   05/04/20 2144 05/04/20 2350  BP: (!) 141/92 129/79  Pulse: (!) 107 98  Resp: 16 18  Temp: 99 F (37.2 C)   SpO2: 100% 98%    Constitutional: Alert and oriented. Well appearing and in no apparent distress. HEENT:      Head: Normocephalic and atraumatic.         Eyes: Conjunctivae are normal. Sclera is non-icteric.       Mouth/Throat: Mucous membranes are moist.       Neck: Supple with no signs of meningismus. Cardiovascular: Regular rate and rhythm. Respiratory: Normal respiratory effort. Lungs are clear to auscultation bilaterally.  Gastrointestinal: Soft, non tender, and non distended with positive bowel sounds. No rebound or guarding. Pelvic exam: Normal external genitalia, no rashes or lesions. Small amount of blood in the vaginal vault. Os closed. No cervical motion tenderness.  No uterine or adnexal tenderness.   Musculoskeletal:  No edema, cyanosis, or erythema of extremities. Neurologic: Normal speech and language. Face is symmetric. Moving all extremities. No gross focal neurologic deficits are appreciated. Skin: Skin is warm, dry and intact. No rash noted. Psychiatric: Mood and affect are normal. Speech and behavior are normal.  ____________________________________________   LABS (all labs ordered are listed, but only abnormal results are displayed)  Labs Reviewed  WET PREP, GENITAL - Abnormal; Notable for the following components:      Result Value   WBC, Wet Prep HPF POC FEW (*)    All other components within normal limits  COMPREHENSIVE METABOLIC PANEL - Abnormal; Notable for the following components:   Creatinine, Ser 0.42 (*)    All other components within normal limits  CBC - Abnormal; Notable for the following  components:   WBC 12.0 (*)    All other components within normal limits  URINALYSIS, COMPLETE (UACMP) WITH MICROSCOPIC - Abnormal; Notable for the following components:   Color, Urine YELLOW (*)    APPearance CLEAR (*)    Hgb urine dipstick LARGE (*)    RBC / HPF >50 (*)    Bacteria, UA FEW (*)    All other components within normal limits  HCG, QUANTITATIVE, PREGNANCY - Abnormal; Notable for the following components:   hCG, Beta Chain, Quant, S 94,158 (*)    All other components within normal limits  POC URINE PREG, ED - Abnormal; Notable for the following components:   Preg Test, Ur POSITIVE (*)    All other components within normal limits  CHLAMYDIA/NGC RT PCR ([facility] ONLY)  URINE CULTURE  ABO/RH   ____________________________________________  EKG  none  ____________________________________________  RADIOLOGY  I have personally reviewed the images performed during this visit and I agree with the Radiologist's read.   Interpretation by Radiologist:  US OB  LESS THAN 14 WEEKS WITH OB TRANSVAGINAL  Result Date: 05/05/2020 CLINICAL DATA:  Left lower quadrant abdominal pain, vaginal bleeding, positive pregnancy test. LMP 03/16/2020. EXAM: OBSTETRIC <14 WK [place] AND TRANSVAGINAL OB US TECHNIQUE: Both transabdominal and transvaginal ultrasound examinations were performed for complete evaluation of the gestation as well as the maternal uterus, adnexal regions, and pelvic cul-de-sac. Transvaginal technique was performed to assess early pregnancy. COMPARISON:  None. FINDINGS: Intrauterine gestational sac: Present, single Yolk sac:  Present, single, normal-appearing Embryo:  Present, single Cardiac Activity: Present, regular Heart Rate: 123 bpm MSD: Appropriate given fetal size CRL:  7 mm   6 w   4 d                  [place] EDC: 12/25/2020 Subchorionic hemorrhage: A small subchorionic hemorrhage is identified along the caudal aspect of the gestational sac comprising less than 25% of the  sac. A small amount of debris and fluid is seen within the endometrial cavity within the fundus possibly representing a small amount blood product Maternal uterus/adnexae: No uterine masses are seen. The cervix is closed and is unremarkable. There is no free fluid within the cul-de-sac. The maternal ovaries are normal in size and echogenicity. Corpus luteum is noted within the right ovary. No adnexal masses are seen. IMPRESSION: Single living intrauterine gestation with an estimated gestational age of [redacted] weeks, 4 days. Small subchorionic hemorrhage. Electronically Signed   By: Helyn Numbers MD   On: 05/05/2020 01:49     ____________________________________________   PROCEDURES  Procedure(s) performed: None Procedures Critical Care performed:  None ____________________________________________   INITIAL IMPRESSION / ASSESSMENT AND PLAN / ED COURSE   39 y.o. female with a history of hypertension, anxiety and depression currently at estimated [redacted] weeks gestational age who presents for evaluation of vaginal bleeding.  Patient is hemodynamically stable, extremely well-appearing, abdomen is soft with no tenderness, pelvic exam with small amount of blood in a closed os.  No signs of the pregnancy sac coming through the os.  STD screening negative.  Transvaginal ultrasound visualized by me showing a single live IUP, confirmed by radiology who also describes a small subchorionic hemorrhage.  Patient is B+ no indication for RhoGam.  Blood work showing no signs of anemia with stable hemoglobin at 13.7.  hCG and 94,000.  UA with a few bacteria but no nitrites and no leukocytes.  Patient denies any signs of a urinary tract infection.  Most likely a dirty sample.  Will send for culture.  Discussed pelvic rest and close follow-up with OB/GYN.  Discussed signs and symptoms of acute blood loss anemia and recommended return to the emergency room if these develop.  Old medical records reviewed.       _____________________________________________ Please note:  Patient was evaluated in Emergency Department today for the symptoms described in the history of present illness. Patient was evaluated in the context of the global COVID-19 pandemic, which necessitated consideration that the patient might be at risk for infection with the SARS-CoV-2 virus that causes COVID-19. Institutional protocols and algorithms that pertain to the evaluation of patients at risk for COVID-19 are in a state of rapid change based on information released by regulatory bodies including the CDC and federal and state organizations. These policies and algorithms were followed during the patient's care in the ED.  Some ED evaluations and interventions may be delayed as a result of limited staffing during the pandemic.    Controlled Substance Database was reviewed by me.  ____________________________________________   FINAL CLINICAL IMPRESSION(S) / ED DIAGNOSES   Final diagnoses:  Vaginal bleeding in pregnancy  Subchorionic hemorrhage of placenta in first trimester, single or unspecified fetus      NEW MEDICATIONS STARTED DURING THIS VISIT:  ED Discharge Orders    None       Note:  This document was prepared using Dragon voice recognition software and may include unintentional dictation errors.    Don Perking, [place], MD 05/05/20 [phone]

## 2020-05-06 LAB — URINE CULTURE: Culture: NO GROWTH

## 2020-05-22 ENCOUNTER — Other Ambulatory Visit: Payer: Self-pay | Admitting: Obstetrics and Gynecology

## 2020-05-22 DIAGNOSIS — O4690 Antepartum hemorrhage, unspecified, unspecified trimester: Secondary | ICD-10-CM

## 2020-05-28 ENCOUNTER — Encounter: Payer: Self-pay | Admitting: Obstetrics and Gynecology

## 2020-05-28 ENCOUNTER — Ambulatory Visit (INDEPENDENT_AMBULATORY_CARE_PROVIDER_SITE_OTHER): Payer: Medicaid Other

## 2020-05-28 ENCOUNTER — Ambulatory Visit (INDEPENDENT_AMBULATORY_CARE_PROVIDER_SITE_OTHER): Payer: Medicaid Other | Admitting: Obstetrics and Gynecology

## 2020-05-28 ENCOUNTER — Other Ambulatory Visit (HOSPITAL_COMMUNITY)
Admission: RE | Admit: 2020-05-28 | Discharge: 2020-05-28 | Disposition: A | Discharge status: Home / Self Care | Payer: Medicaid Other | Source: Ambulatory Visit | Attending: Obstetrics and Gynecology | Admitting: Obstetrics and Gynecology

## 2020-05-28 ENCOUNTER — Other Ambulatory Visit: Payer: Self-pay

## 2020-05-28 VITALS — BP 130/80 | Wt 176.0 lb

## 2020-05-28 DIAGNOSIS — Z1379 Encounter for other screening for genetic and chromosomal anomalies: Secondary | ICD-10-CM | POA: Diagnosis not present

## 2020-05-28 DIAGNOSIS — O0991 Supervision of high risk pregnancy, unspecified, first trimester: Secondary | ICD-10-CM

## 2020-05-28 DIAGNOSIS — O09529 Supervision of elderly multigravida, unspecified trimester: Secondary | ICD-10-CM | POA: Insufficient documentation

## 2020-05-28 DIAGNOSIS — O169 Unspecified maternal hypertension, unspecified trimester: Secondary | ICD-10-CM | POA: Insufficient documentation

## 2020-05-28 DIAGNOSIS — Z113 Encounter for screening for infections with a predominantly sexual mode of transmission: Secondary | ICD-10-CM

## 2020-05-28 DIAGNOSIS — Z369 Encounter for antenatal screening, unspecified: Secondary | ICD-10-CM | POA: Diagnosis not present

## 2020-05-28 DIAGNOSIS — Z3A10 10 weeks gestation of pregnancy: Secondary | ICD-10-CM | POA: Diagnosis not present

## 2020-05-28 DIAGNOSIS — O4690 Antepartum hemorrhage, unspecified, unspecified trimester: Secondary | ICD-10-CM

## 2020-05-28 DIAGNOSIS — O161 Unspecified maternal hypertension, first trimester: Secondary | ICD-10-CM

## 2020-05-28 DIAGNOSIS — O09521 Supervision of elderly multigravida, first trimester: Secondary | ICD-10-CM

## 2020-05-28 DIAGNOSIS — Z3149 Encounter for other procreative investigation and testing: Secondary | ICD-10-CM | POA: Diagnosis not present

## 2020-05-28 DIAGNOSIS — I479 Paroxysmal tachycardia, unspecified: Secondary | ICD-10-CM | POA: Insufficient documentation

## 2020-05-28 DIAGNOSIS — E669 Obesity, unspecified: Secondary | ICD-10-CM

## 2020-05-28 DIAGNOSIS — R000 Tachycardia, unspecified: Secondary | ICD-10-CM | POA: Insufficient documentation

## 2020-05-28 DIAGNOSIS — O0990 Supervision of high risk pregnancy, unspecified, unspecified trimester: Secondary | ICD-10-CM | POA: Insufficient documentation

## 2020-05-28 DIAGNOSIS — Z7185 Encounter for immunization safety counseling: Secondary | ICD-10-CM

## 2020-05-28 MED ORDER — DOXYLAMINE-PYRIDOXINE 10-10 MG PO TBEC: 2.0000 | DELAYED_RELEASE_TABLET | Freq: Every day | ORAL | 5 refills | Status: DC

## 2020-05-28 NOTE — Patient Instructions (Signed)
For nausea (these may be purchased over-the-counter): -Vitamin B6 (pyridoxine):  25 mg three times each day (may buy 100 mg tablet and take twice per day or try to cut into 4 equal pieces and take 1 piece three times each day).  - doxylamine (found in [employer] and other sleep agents that can be bought in the store): take 12.5 - 25 mg at bedtime.  May take up to 25 mg three time each day.  However, keep in mind that this might make you sleepy.  

## 2020-05-28 NOTE — Progress Notes (Signed)
New Obstetric Patient H&P    Chief Complaint: Positive home pregnancy test   History of Present Illness: Patient is a 40 y.o. [id number] Not Hispanic or Latino female, presents with amenorrhea and positive home pregnancy test. Patient's last menstrual period was 03/16/2020 (approximate). and based on her  LMP, her EDD is Estimated Date of Delivery: 12/21/20 and her EGA is [redacted]w[redacted]d. Cycles are 3-4. days, irregular, and occur approximately every : infrequently (can go 3-4 months without a cycle) days. Her last pap smear was 08/16/19 and was NILM.    She had a urine pregnancy test which was positive 4 week(s)  ago. Her last menstrual period was normal and lasted for  3 or 4 day(s). Since her LMP she claims she has experienced increase in N/V and headaches. She denies vaginal bleeding. Her past medical history is contibutory - cHTN, tachycardia, 1 ppd smoker, abn pap/hx of LEEP. Her prior pregnancies are notable for 34 week preterm delivery (SVD following PPROM).  Since her LMP, she admits to the use of tobacco products  Yes - currently down to 1/2 ppd She claims she has gained 6 pounds since the start of her pregnancy.  There are cats in the home in the home  yes If yes Indoor - reviewed risks of litterbox changing She admits close contact with children on a regular basis  yes  She has had chicken pox in the past yes She has had Tuberculosis exposures, symptoms, or previously tested positive for TB   no Current or past history of domestic violence. no  Genetic Screening/Teratology Counseling: (Includes patient, baby's father, or anyone in either family with:)   1. Patient's age >/= 33 at [facility]  yes 2. Thalassemia ([place], [place], Mediterranean, or Asian background): MCV<80  no 3. Neural tube defect (meningomyelocele, spina bifida, anencephaly)  no 4. Congenital heart defect  no  5. Down syndrome  no 6. Tay-Sachs (Jewish, [place]  no 7. Canavan's Disease  no 8. Sickle cell disease or trait  (African)  no  9. Hemophilia or other blood disorders  no  10. Muscular dystrophy  no  11. Cystic fibrosis  no  12. Huntington's Chorea  no  13. Mental retardation/autism  no 14. Other inherited genetic or chromosomal disorder  no 15. Maternal metabolic disorder (DM, PKU, etc)  no 16. Patient or FOB with a child with a birth defect not listed above no  16a. Patient or FOB with a birth defect themselves no 17. Recurrent pregnancy loss, or stillbirth  no  18. Any medications since LMP other than prenatal vitamins (include vitamins, supplements, OTC meds, drugs, alcohol)  Yes - metoprolol, Effexor, xanax 19. Any other genetic/environmental exposure to discuss  no  Infection History:   1. Lives with someone with TB or TB exposed  no  2. Patient or partner has history of genital herpes  no 3. Rash or viral illness since LMP  no 4. History of STI (GC, CT, HPV, syphilis, HIV)  no 5. 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 Lives with husband "Leonette Most" [phone]).     Review of Systems:10 point review of systems negative unless otherwise noted in HPI  Past Medical History:  Patient Active Problem List   Diagnosis Date Noted  . Hypertension affecting pregnancy in first trimester 05/28/2020    [ ]  Aspirin 81 mg daily after 12 weeks; discontinue after 36 weeks [  x] baseline labs with CBC, CMP, urine protein/creatinine ratio [ ]  no BP meds unless BPs become elevated - patient PCP d/c'd metoprolol at beginning of pregnancy [ ]  ultrasound for growth at 28, 32, 36 weeks [x ] Baseline EKG - cardiology referral placed  Current antihypertensives: None - metoprolol d/c'd by PCP at beginning of pregnancy  Baseline and surveillance labs ([facility], refresh links as needed)  Lab Results  Component Value Date   PLT 236 05/04/2020   CREATININE 0.42 (L) 05/04/2020   AST 22 05/04/2020   ALT 25  05/04/2020    Antenatal Testing CHTN - O10.919  Group I  BP < 140/90, no preeclampsia, AGA,  nml AFV, +/- meds    Group II BP > 140/90, on meds, no preeclampsia, AGA, nml AFV  20-28-34-38  20-24-28-32-35-38  32//2 x wk  28//BPP wkly then 32//2 x wk  40 no meds; 39 meds  PRN or 37  Pre-eclampsia  GHTN - O13.9/Preeclampsia without severe features  - O14.00   Preeclampsia with severe features - O14.10  Q 3-4wks  Q 2 wks  28//BPP wkly then 32//2 x wk  Inpatient  39  PRN or 34      . Tachycardia 05/28/2020  . Multigravida of advanced maternal age in first trimester 05/28/2020  . Supervision of high risk pregnancy in first trimester 05/28/2020     Nursing Staff Provider  Office Location  [facility] Dating   LMP = 10w US  Language  English Anatomy US    Flu Vaccine   declines Genetic Screen  NIPS:   collected   TDaP vaccine    Hgb A1C or  GTT Early : ordered Third trimester :   Rhogam   n/a   LAB RESULTS   Feeding Plan  breast Blood Type --/--/B POS Performed at [facility], [address] [place], [place] [address]  [phone](12/27 2146)   Contraception  Antibody    Circumcision  Rubella    Pediatrician   RPR     Support Person  Leonette Mostharles (Buddy) - Husband HBsAg     Prenatal Classes  HIV      Varicella    BTL Consent  n/a GBS  (For PCN allergy, check sensitivities)        VBAC Consent  n/a Pap  08/16/19 - NILM    Hgb Electro      CF      SMA         High Risk Pregnancy Diagnoses  AMA Obesity class I (pregravid BMI >30) cHTN (on metoprolol prior to pregnancy) Hx of PTL (PPROM at 34w, NSVD, hx of LEEP)   . Adjustment disorder with mixed anxiety and depressed mood 10/08/2017  . Obesity, Class I, BMI 30-34.9 10/08/2017  . Chronic fatigue 10/08/2017  . History of female hirsutism 11/11/2015  . ASCUS favor benign 11/11/2015  . Hirsutism 08/05/2014  . Oligomenorrhea 08/05/2014  . PCOS (polycystic ovarian syndrome) 08/05/2014  . Smoker 08/05/2014  .  Anxiety 05/06/2013    Past Surgical History:  Past Surgical History:  Procedure Laterality Date  . LEEP    . PERIURETHRAL ABSCESS DRAINAGE     abscess removed from tonsils. Tonsils not removed  . WISDOM TOOTH EXTRACTION      Gynecologic History: Patient's last menstrual period was 03/16/2020 (approximate).  Obstetric History: [id number]  Family History:  Family History  Problem Relation Age of Onset  . Cancer Mother        bladder  .  Hypertension Mother   . Emphysema Father   . Hypertension Father   . Cancer Father 47       Colon  . Diabetes Paternal Grandmother   . Other Neg Hx     Social History:  Social History   Socioeconomic History  . Marital status: Married    Spouse name: Not on file  . Number of children: Not on file  . Years of education: Not on file  . Highest education level: Not on file  Occupational History  . Not on file  Tobacco Use  . Smoking status: Current Every Day Smoker    Packs/day: 0.50    Years: 14.00    Pack years: 7.00    Types: Cigarettes    Last attempt to quit: 06/09/2017    Years since quitting: 2.9  . Smokeless tobacco: Never Used  Vaping Use  . Vaping Use: Some days  Substance and Sexual Activity  . Alcohol use: Not Currently    Alcohol/week: 0.0 standard drinks    Comment: Rare  . Drug use: No  . Sexual activity: Yes    Partners: Male    Birth control/protection: None    Comment: 1st intercourse- 19, partners- 6  Other Topics Concern  . Not on file  Social History Narrative  . Not on file   Social Determinants of Health   Financial Resource Strain: Not on file  Food Insecurity: Not on file  Transportation Needs: Not on file  Physical Activity: Not on file  Stress: Not on file  Social Connections: Not on file  Intimate Partner Violence: Not on file    Allergies:  No Known Allergies  Medications: Prior to Admission medications   Medication Sig Start Date End Date Taking? Authorizing Provider   Doxylamine-Pyridoxine (DICLEGIS) 10-10 MG TBEC Take 2 tablets by mouth at bedtime. If symptoms persist, add one tablet in the morning and one in the afternoon 05/28/20  Yes Adolphus Hanf, CNM  Prenatal Vit-Fe Fumarate-FA (PRENATAL PO) Take by mouth.   Yes [provider]  albuterol (PROVENTIL HFA;VENTOLIN HFA) 108 (90 Base) MCG/ACT inhaler Inhale 2 puffs into the lungs every 4 (four) hours as needed for wheezing or shortness of breath (cough, shortness of breath or wheezing.). Patient not taking: Reported on 05/28/2020 06/26/17   Emi Belfast, FNP  ipratropium (ATROVENT) 0.06 % nasal spray INSTILL 2 SPRAYS IN EACH NOSTRIL 3-4 TIMES A DAY AS NEEDED FOR RUNNY NOSE Patient not taking: Reported on 05/28/2020 06/21/17   [provider]  metoprolol succinate (TOPROL-XL) 100 MG 24 hr tablet Take 0.5 tablets (50 mg total) by mouth daily for 14 days. Take with or immediately following a meal. 04/28/20 05/12/20  Emi Belfast, FNP  venlafaxine XR (EFFEXOR-XR) 75 MG 24 hr capsule Take 1 capsule (75 mg total) by mouth daily with breakfast. Patient not taking: Reported on 05/28/2020 04/28/20 05/28/20  Emi Belfast, FNP    Physical Exam Vitals: Blood pressure 130/80, weight 176 lb (79.8 kg), last menstrual period 03/16/2020.  General: NAD HEENT: normocephalic, anicteric Thyroid: no enlargement, no palpable nodules Pulmonary: No increased work of breathing, CTAB Cardiovascular: RRR, distal pulses 2+ Abdomen: NABS, soft, non-tender, non-distended.  Umbilicus without lesions.  No hepatomegaly, splenomegaly or masses palpable. No evidence of hernia  Genitourinary:  External: Normal external female genitalia.  Normal urethral meatus, normal  Bartholin's and Skene's glands.    Vagina: Normal vaginal mucosa, no evidence of prolapse.    Cervix: Grossly normal in appearance, no  bleeding  Uterus:Enlarged (size consistent with 10-12 week dates), mobile, normal contour.  No CMT  Adnexa:  ovaries non-enlarged, no adnexal masses  Rectal: deferred Extremities: no edema, erythema, or tenderness Neurologic: Grossly intact Psychiatric: mood appropriate, affect full   Assessment: 40 y.o. [id number] at [redacted]w[redacted]d presenting to initiate prenatal care  Plan: 1) Avoid alcoholic beverages. 2) Patient encouraged not to smoke. Patient stated understanding. 3) Discontinue the use of all non-medicinal drugs and chemicals.  4) Take prenatal vitamins daily.  5) Nutrition, food safety (fish, cheese advisories, and high nitrite foods) and exercise discussed. 6) Hospital and practice style discussed with cross coverage system.  7) Genetic Screening, such as with 1st Trimester Screening, cell free fetal DNA, AFP testing, and Ultrasound, as well as with amniocentesis and CVS as appropriate, is discussed with patient. At the conclusion of today's visit patient requested genetic testing (NIPS and Inheritest today)  8) Hx of HTN and tachycardia - RTC in two weeks for BP check since now off meds (per PCP), referral to cards for baseline EKG  9) BMI >30 - early 1h GTT at next visit  10) NOB labs/NIPS/Inheritest/baseline PIH labs today  11) N/V in pregnancy - provided info for OCT tx and Rx'd diclegis - f/u if sx persist  12) Hx of PTL, hx of LEEP - cervical length check at 16 weeks  13) Hx of depression/anxiety - recently weaned off medications - patient states she is currently coping well, will discuss further if patient feels like change in current sx  RTC in 2 wk for MD visit/BP check/1h GTT   Zipporah Plants, CNM, MSN [facility], [facility] 05/28/2020, 4:22 PM

## 2020-05-29 LAB — RPR+RH+ABO+RUB AB+AB SCR+CB...
Antibody Screen: NEGATIVE
HIV Screen 4th Generation wRfx: NONREACTIVE
Hematocrit: 39.1 % (ref 34.0–46.6)
Hemoglobin: 13.5 g/dL (ref 11.1–15.9)
Hepatitis B Surface Ag: NEGATIVE
MCH: 31.5 pg (ref 26.6–33.0)
MCHC: 34.5 g/dL (ref 31.5–35.7)
MCV: 91 fL (ref 79–97)
Platelets: 211 10*3/uL (ref 150–450)
RBC: 4.28 x10E6/uL (ref 3.77–5.28)
RDW: 11.6 % — ABNORMAL LOW (ref 11.7–15.4)
RPR Ser Ql: NONREACTIVE
Rh Factor: POSITIVE
Rubella Antibodies, IGG: 9.51 index (ref >0.99)
Varicella zoster IgG: 427 index (ref >165)
WBC: 9.9 10*3/uL (ref 3.4–10.8)

## 2020-05-29 LAB — COMPREHENSIVE METABOLIC PANEL
ALT: 20 IU/L (ref 0–32)
AST: 16 IU/L (ref 0–40)
Albumin/Globulin Ratio: 1.5 (ref 1.2–2.2)
Albumin: 4.1 g/dL (ref 3.8–4.8)
Alkaline Phosphatase: 72 IU/L (ref 44–121)
BUN/Creatinine Ratio: 16 (ref 9–23)
BUN: 7 mg/dL (ref 6–20)
Bilirubin Total: 0.3 mg/dL (ref 0.0–1.2)
CO2: 19 mmol/L — ABNORMAL LOW (ref 20–29)
Calcium: 9.1 mg/dL (ref 8.7–10.2)
Chloride: 101 mmol/L (ref 96–106)
Creatinine, Ser: 0.44 mg/dL — ABNORMAL LOW (ref 0.57–1.00)
GFR calc Af Amer: 149 mL/min/{1.73_m2} (ref >59)
GFR calc non Af Amer: 129 mL/min/{1.73_m2} (ref >59)
Globulin, Total: 2.8 g/dL (ref 1.5–4.5)
Glucose: 78 mg/dL (ref 65–99)
Potassium: 3.5 mmol/L (ref 3.5–5.2)
Sodium: 134 mmol/L (ref 134–144)
Total Protein: 6.9 g/dL (ref 6.0–8.5)

## 2020-05-29 LAB — PROTEIN / CREATININE RATIO, URINE
Creatinine, Urine: 169.5 mg/dL
Protein, Ur: 28.4 mg/dL
Protein/Creat Ratio: 168 mg/g creat (ref 0–200)

## 2020-05-30 LAB — URINE CULTURE: Organism ID, Bacteria: NO GROWTH

## 2020-05-30 LAB — URINE DRUG PANEL 7
Amphetamines, Urine: NEGATIVE ng/mL
Barbiturate Quant, Ur: NEGATIVE ng/mL
Benzodiazepine Quant, Ur: NEGATIVE ng/mL
Cannabinoid Quant, Ur: NEGATIVE ng/mL
Cocaine (Metab.): NEGATIVE ng/mL
Opiate Quant, Ur: NEGATIVE ng/mL
PCP Quant, Ur: NEGATIVE ng/mL

## 2020-06-01 LAB — CERVICOVAGINAL ANCILLARY ONLY
Chlamydia: NEGATIVE
Comment: NEGATIVE
Comment: NEGATIVE
Comment: NORMAL
Neisseria Gonorrhea: NEGATIVE
Trichomonas: NEGATIVE

## 2020-06-02 LAB — MATERNIT 21 PLUS CORE, BLOOD
Fetal Fraction: 8
Result (T21): NEGATIVE
Trisomy 13 (Patau syndrome): NEGATIVE
Trisomy 18 (Edwards syndrome): NEGATIVE
Trisomy 21 (Down syndrome): NEGATIVE

## 2020-06-11 ENCOUNTER — Other Ambulatory Visit: Payer: Medicaid Other

## 2020-06-11 ENCOUNTER — Ambulatory Visit (INDEPENDENT_AMBULATORY_CARE_PROVIDER_SITE_OTHER): Payer: Medicaid Other | Admitting: Obstetrics & Gynecology

## 2020-06-11 ENCOUNTER — Encounter: Payer: Self-pay | Admitting: Obstetrics & Gynecology

## 2020-06-11 ENCOUNTER — Other Ambulatory Visit: Payer: Self-pay

## 2020-06-11 VITALS — BP 120/80 | Wt 176.0 lb

## 2020-06-11 DIAGNOSIS — O161 Unspecified maternal hypertension, first trimester: Secondary | ICD-10-CM

## 2020-06-11 DIAGNOSIS — Z9889 Other specified postprocedural states: Secondary | ICD-10-CM | POA: Insufficient documentation

## 2020-06-11 DIAGNOSIS — O0991 Supervision of high risk pregnancy, unspecified, first trimester: Secondary | ICD-10-CM | POA: Diagnosis not present

## 2020-06-11 DIAGNOSIS — E669 Obesity, unspecified: Secondary | ICD-10-CM

## 2020-06-11 DIAGNOSIS — Z3A12 12 weeks gestation of pregnancy: Secondary | ICD-10-CM

## 2020-06-11 DIAGNOSIS — O09521 Supervision of elderly multigravida, first trimester: Secondary | ICD-10-CM

## 2020-06-11 LAB — POCT URINALYSIS DIPSTICK OB
Glucose, UA: NEGATIVE
POC,PROTEIN,UA: NEGATIVE

## 2020-06-11 LAB — INHERITEST CORE(CF97,SMA,FRAX)

## 2020-06-11 MED ORDER — ONDANSETRON 4 MG PO TBDP: 4.0000 mg | ORAL_TABLET | Freq: Four times a day (QID) | ORAL | 0 refills | Status: DC | PRN

## 2020-06-11 MED ORDER — BUTALBITAL-APAP-CAFFEINE 50-325-40 MG PO CAPS: 1.0000 | ORAL_CAPSULE | Freq: Four times a day (QID) | ORAL | 1 refills | Status: DC | PRN

## 2020-06-11 NOTE — Progress Notes (Signed)
  Subjective  Nausea nd headaches reported  Discussed prior hx- LEEP as teenager; PPROM/PTD 34 weeks 2014.  Objective  BP 120/80   Wt 176 lb (79.8 kg)   LMP 03/16/2020 (Approximate)   BMI 32.19 kg/m  General: NAD Pumonary: no increased work of breathing Abdomen: gravid, non-tender Extremities: no edema Psychiatric: mood appropriate, affect full  Assessment  40 y.o. [id number] at [redacted]w[redacted]d by  12/21/2020, by Last Menstrual Period presenting for routine prenatal visit  Plan   Problem List Items Addressed This Visit    Hypertension affecting pregnancy in first trimester   Obesity, Class I, BMI 30-34.9    Glucola today   Multigravida of advanced maternal age in first trimester    NIPT results dicussed    Anat [place] 20 weeks planned   Supervision of high risk pregnancy in first trimester - PPROM and PTD 34 weeks    Discussed pros and cons of 17-OHP for preterm labor prevention    Will decide, and if so plan weekly injections starting at 16 weeks    [place] for cervical length   H/O LEEP   US OB Transvaginal for cervical length   [redacted] weeks gestation of pregnancy        Zofran for nausea, Fioricet prn for headaches    PNV      Annamarie Major, MD, [facility], [facility] 06/11/2020  8:34 AM

## 2020-06-11 NOTE — Patient Instructions (Signed)
Thank you for choosing [facility]. As part of our ongoing efforts to improve patient experience, we would appreciate your feedback. Please fill out the short survey that you will receive by mail or MyChart. Your opinion is important to [place]! -Dr Tiburcio Pea  Hydroxyprogesterone caproate injection for pregnancy What is this medicine? HYDROXYPROGESTERONE (hye drox ee proe JES ter one) is a female hormone. This medicine is used in women who are pregnant and who have delivered a baby too early (preterm) in the past. It helps lower the risk of having a preterm baby again. This medicine may be used for other purposes; ask your health care provider or pharmacist if you have questions. COMMON BRAND NAME(S): Makena What should I tell my health care provider before I take this medicine? They need to know if you have any of these conditions:  breast, cervical, uterine, or vaginal cancer  depression  diabetes or prediabetes  heart disease  high blood pressure  history of blood clots  kidney disease  liver disease  lung or breathing disease, like asthma  migraine headaches  seizures  vaginal bleeding  an unusual or allergic reaction to hydroxyprogesterone, other hormones, castor oil, benzyl alcohol, other medicines, foods, dyes, or preservatives  breast-feeding How should I use this medicine? This medicine is for injection into a muscle or under the skin. You will receive an injection once every week (every 7 days) as directed during your pregnancy. It is given by a health care professional in a hospital or clinic setting. Talk to your pediatrician regarding the use of this medicine in children. While this drug may be prescribed for pregnant women as young as 16 years, precautions do apply. Overdosage: If you think you have taken too much of this medicine contact a poison control center or emergency room at once. NOTE: This medicine is only for you. Do not share this medicine with  others. What if I miss a dose? It is important not to miss your dose. Call your doctor or health care professional if you are unable to keep an appointment. What may interact with this medicine? Significant interactions are not expected. This list may not describe all possible interactions. Give your health care provider a list of all the medicines, herbs, non-prescription drugs, or dietary supplements you use. Also tell them if you smoke, drink alcohol, or use illegal drugs. Some items may interact with your medicine. What should I watch for while using this medicine? Your pregnancy will be monitored carefully while you are receiving this medicine. What side effects may I notice from receiving this medicine? Side effects that you should report to your doctor or health care professional as soon as possible:  allergic reactions like skin rash, itching or hives, swelling of the face, lips, or tongue  breathing problems  depressed mood  increase in blood pressure  increased hunger or thirst  increased urination  signs and symptoms of a blood clot such as breathing problems; changes in vision; chest pain; severe, sudden headache; pain, swelling, warmth in the leg; trouble speaking; sudden numbness or weakness of the face, arm or leg  unusually weak or tired  unusual vaginal bleeding  yellowing of the eyes or skin Side effects that usually do not require medical attention (report to your doctor or health care professional if they continue or are bothersome):  diarrhea  fluid retention and swelling  nausea  pain, redness, or irritation at site where injected This list may not describe all possible side effects. Call  your doctor for medical advice about side effects. You may report side effects to FDA at 1-800-FDA-1088. Where should I keep my medicine? This drug is given in a hospital or clinic and will not be stored at home. NOTE: This sheet is a summary. It may not cover all  possible information. If you have questions about this medicine, talk to your doctor, pharmacist, or health care provider.  2021 Elsevier/Gold Standard (2017-01-08 11:14:47)  

## 2020-06-12 ENCOUNTER — Telehealth: Payer: Self-pay

## 2020-06-12 LAB — GLUCOSE, 1 HOUR GESTATIONAL: Gestational Diabetes Screen: 189 mg/dL — ABNORMAL HIGH (ref 65–139)

## 2020-06-12 NOTE — Telephone Encounter (Signed)
-----   Message from Zipporah Plants, [place] sent at 06/12/2020  9:46 AM EST ----- Regarding: 3h GTT Could you call and schedule this patient for a 3h GTT and ROB? Thanks, Jae Dire

## 2020-06-12 NOTE — Telephone Encounter (Signed)
Patient is scheduled for 06/17/20

## 2020-06-17 ENCOUNTER — Other Ambulatory Visit: Payer: Self-pay | Admitting: Obstetrics

## 2020-06-17 ENCOUNTER — Encounter: Payer: Self-pay | Admitting: Obstetrics

## 2020-06-17 ENCOUNTER — Ambulatory Visit (INDEPENDENT_AMBULATORY_CARE_PROVIDER_SITE_OTHER): Payer: Medicaid Other | Admitting: Obstetrics

## 2020-06-17 ENCOUNTER — Other Ambulatory Visit: Payer: Medicaid Other

## 2020-06-17 ENCOUNTER — Other Ambulatory Visit: Payer: Self-pay

## 2020-06-17 VITALS — BP 120/80 | Wt 175.0 lb

## 2020-06-17 DIAGNOSIS — O0991 Supervision of high risk pregnancy, unspecified, first trimester: Secondary | ICD-10-CM | POA: Diagnosis not present

## 2020-06-17 DIAGNOSIS — R7309 Other abnormal glucose: Secondary | ICD-10-CM | POA: Diagnosis not present

## 2020-06-17 DIAGNOSIS — Z3A13 13 weeks gestation of pregnancy: Secondary | ICD-10-CM

## 2020-06-17 DIAGNOSIS — Z9889 Other specified postprocedural states: Secondary | ICD-10-CM

## 2020-06-17 LAB — POCT URINALYSIS DIPSTICK OB
Glucose, UA: NEGATIVE
POC,PROTEIN,UA: NEGATIVE

## 2020-06-17 NOTE — Addendum Note (Signed)
Addended by: Cornelius Moras D on: 06/17/2020 09:47 AM   Modules accepted: Orders

## 2020-06-17 NOTE — Progress Notes (Signed)
Routine Prenatal Care Visit  Subjective  Tami Foster is a 40 y.o. [phone] at [redacted]w[redacted]d being seen today for ongoing prenatal care.  She is currently monitored for the following issues for this high-risk pregnancy and has Anxiety; Hirsutism; Oligomenorrhea; PCOS (polycystic ovarian syndrome); Smoker; History of female hirsutism; ASCUS favor benign; Adjustment disorder with mixed anxiety and depressed mood; Obesity, Class I, BMI 30-34.9; Chronic fatigue; Hypertension affecting pregnancy in first trimester; Tachycardia; Multigravida of advanced maternal age in first trimester; Supervision of high risk pregnancy in first trimester; and H/O LEEP on their problem list.  ----------------------------------------------------------------------------------- Patient reports no complaints.  She is having a 3 hr GTT today. She does mention that she has been constipated Miralax,stool softeners suggested- prune juice  . Vag. Bleeding: None.   . Leaking Fluid denies.  ----------------------------------------------------------------------------------- The following portions of the patient's history were reviewed and updated as appropriate: allergies, current medications, past family history, past medical history, past social history, past surgical history and problem list. Problem list updated.  Objective  Blood pressure 120/80, weight 175 lb (79.4 kg), last menstrual period 03/16/2020. Pregravid weight 170 lb (77.1 kg) Total Weight Gain 5 lb (2.268 kg) Urinalysis: Urine Protein    Urine Glucose    Fetal Status:           General:  Alert, oriented and cooperative. Patient is in no acute distress.  Skin: Skin is warm and dry. No rash noted.   Cardiovascular: Normal heart rate noted  Respiratory: Normal respiratory effort, no problems with respiration noted  Abdomen: Soft, gravid, appropriate for gestational age. Pain/Pressure: Absent     Pelvic:  Cervical exam deferred        Extremities: Normal range of  motion.     Mental Status: Normal mood and affect. Normal behavior. Normal judgment and thought content.   Assessment   40 y.o. [id number] at [redacted]w[redacted]d by  12/21/2020, by Last Menstrual Period presenting for routine prenatal visit  Plan   THIRD Problems (from 05/28/20 to present)    Problem Noted Resolved   Supervision of high risk pregnancy in first trimester 05/28/2020 by Zipporah Plants, CNM No   Overview Addendum 06/11/2020  8:37 AM by Nadara Mustard, MD     Nursing Staff Provider  Office Location  [facility] Dating   LMP = 10w [place]  Language  English Anatomy US    Flu Vaccine   declines Genetic Screen  NIPS:nml XY  TDaP vaccine    Hgb A1C or  GTT Early : ordered Third trimester :   Rhogam   n/a   LAB RESULTS   Feeding Plan  breast Blood Type B/Positive/-- (01/20 1627)   Contraception  Antibody Negative (01/20 1627)  Circumcision  Rubella 9.51 (01/20 1627)  Pediatrician   RPR Non Reactive (01/20 1627)   Support Person  Leonette Most (Buddy) - Husband HBsAg Negative (01/20 1627)   Prenatal Classes  HIV Non Reactive (01/20 1627)    Varicella  IMM  BTL Consent  n/a GBS  (For PCN allergy, check sensitivities)        VBAC Consent  n/a Pap  08/16/19 - NILM    Hgb Electro      CF      SMA         High Risk Pregnancy Diagnoses  AMA Obesity class I (pregravid BMI >30) cHTN (on metoprolol prior to pregnancy) Hx of PTL (PPROM at 34w, NSVD, hx of LEEP)      Previous Version  Preterm labor symptoms and general obstetric precautions including but not limited to vaginal bleeding, contractions, leaking of fluid and fetal movement were reviewed in detail with the patient. Please refer to After Visit Summary for other counseling recommendations.   Return in about 3 weeks (around 07/08/2020) for return OB, cervical length sono.  Mirna Mires, CNM  06/17/2020 9:29 AM

## 2020-06-18 ENCOUNTER — Encounter: Payer: Self-pay | Admitting: Obstetrics

## 2020-06-18 ENCOUNTER — Other Ambulatory Visit: Payer: Self-pay | Admitting: Obstetrics

## 2020-06-18 DIAGNOSIS — O0991 Supervision of high risk pregnancy, unspecified, first trimester: Secondary | ICD-10-CM

## 2020-06-18 DIAGNOSIS — R7309 Other abnormal glucose: Secondary | ICD-10-CM

## 2020-06-18 DIAGNOSIS — O24419 Gestational diabetes mellitus in pregnancy, unspecified control: Secondary | ICD-10-CM

## 2020-06-18 LAB — GESTATIONAL GLUCOSE TOLERANCE
Glucose, Fasting: 100 mg/dL — ABNORMAL HIGH (ref 65–94)
Glucose, GTT - 1 Hour: 202 mg/dL — ABNORMAL HIGH (ref 65–179)
Glucose, GTT - 2 Hour: 170 mg/dL — ABNORMAL HIGH (ref 65–154)
Glucose, GTT - 3 Hour: 102 mg/dL (ref 65–139)

## 2020-06-18 NOTE — Progress Notes (Signed)
Early 1hr GTT was elevated. Hr three hour results also elevated; FBS 100, 1hr PP -202; and 2 hour PP was 170 pt contacted by phone as well as via My Chart. Will put in an order for diabetes education.  Discussed the increased risks to pregnancy should her sugar not be well controlled. Importance of monitoring and following counsel of the [employer] stressed.  Mirna Mires, CNM  06/18/2020 10:37 AM

## 2020-06-18 NOTE — Progress Notes (Signed)
Pt notified of her elevated results. Referral to Lifestyles made. Mirna Mires, CNM  06/18/2020 11:22 AM

## 2020-06-25 LAB — GESTATIONAL GLUCOSE TOLERANCE

## 2020-06-26 ENCOUNTER — Other Ambulatory Visit: Payer: Self-pay

## 2020-06-26 ENCOUNTER — Encounter: Payer: Self-pay | Admitting: *Deleted

## 2020-06-26 ENCOUNTER — Other Ambulatory Visit: Payer: Self-pay | Admitting: Obstetrics

## 2020-06-26 ENCOUNTER — Encounter: Payer: Medicaid Other | Attending: Obstetrics | Admitting: *Deleted

## 2020-06-26 VITALS — BP 120/76 | Ht 62.0 in | Wt 173.8 lb

## 2020-06-26 DIAGNOSIS — O0991 Supervision of high risk pregnancy, unspecified, first trimester: Secondary | ICD-10-CM

## 2020-06-26 DIAGNOSIS — Z3A00 Weeks of gestation of pregnancy not specified: Secondary | ICD-10-CM | POA: Diagnosis not present

## 2020-06-26 DIAGNOSIS — O24419 Gestational diabetes mellitus in pregnancy, unspecified control: Secondary | ICD-10-CM

## 2020-06-26 DIAGNOSIS — O24410 Gestational diabetes mellitus in pregnancy, diet controlled: Secondary | ICD-10-CM

## 2020-06-26 MED ORDER — ACCU-CHEK SOFTCLIX LANCETS MISC: 6 refills | Status: DC

## 2020-06-26 MED ORDER — ACCU-CHEK GUIDE VI STRP: ORAL_STRIP | 12 refills | Status: DC

## 2020-06-26 NOTE — Patient Instructions (Signed)
Read booklet on Gestational Diabetes Follow Gestational Meal Planning Guidelines Don't skip meals Limit fried foods Avoid sugar sweetened drinks Complete a 3 Day Food Record and bring to next appointment Check blood sugars 4 x day - before breakfast and 2 hrs after every meal and record  Bring blood sugar log to all appointments Call MD for prescription for meter strips and lancets Strips  Accu-Chek Guide Lancets   Accu-Chek Softclix Purchase urine ketone strips if instructed by MD and check urine ketones every am:  If + increase bedtime snack to 1 protein and 2 carbohydrate servings Walk 20-30 minutes at least 5 x week if permitted by MD Decrease/quit smoking

## 2020-06-26 NOTE — Progress Notes (Signed)
Diabetes Self-Management Education  Visit Type: First/Initial  Appt. Start Time: 0845 Appt. End Time: 1025  06/26/2020  Tami Foster, identified by name and date of birth, is a 40 y.o. female with a diagnosis of Diabetes: Gestational Diabetes.   ASSESSMENT  Blood pressure 120/76, height 5\' 2"  (1.575 m), weight 173 lb 12.8 oz (78.8 kg), last menstrual period 03/16/2020, estimated date of delivery 12/21/2020 Body mass index is 31.79 kg/m.   Diabetes Self-Management Education - 06/26/20 1041      Visit Information   Visit Type First/Initial      Initial Visit   Diabetes Type Gestational Diabetes    Are you currently following a meal plan? No    Are you taking your medications as prescribed? Yes    Date Diagnosed 1 week ago      Health Coping   How would you rate your overall health? Fair      Psychosocial Assessment   Patient Belief/Attitude about Diabetes Other (comment)   "stressed"   Self-care barriers None    Self-management support Doctor's office;Family    Patient Concerns Nutrition/Meal planning;Glycemic Control;Medication;Monitoring;Weight Control;Healthy Lifestyle    Special Needs None    Preferred Learning Style Visual;Hands on    Learning Readiness Ready    How often do you need to have someone help you when you read instructions, pamphlets, or other written materials from your doctor or pharmacy? 1 - Never    What is the last grade level you completed in school? high school      Pre-Education Assessment   Patient understands the diabetes disease and treatment process. Needs Instruction    Patient understands incorporating nutritional management into lifestyle. Needs Instruction    Patient undertands incorporating physical activity into lifestyle. Needs Instruction    Patient understands using medications safely. Needs Instruction    Patient understands monitoring blood glucose, interpreting and using results Needs Review    Patient understands prevention,  detection, and treatment of acute complications. Needs Instruction    Patient understands prevention, detection, and treatment of chronic complications. Needs Instruction    Patient understands how to develop strategies to address psychosocial issues. Needs Instruction    Patient understands how to develop strategies to promote health/change behavior. Needs Instruction      Complications   How often do you check your blood sugar? 0 times/day (not testing)   Provided Accu-Chek Guide Me meter and instructed on use. BG upon return demonstration was 81 mg/dL at 06/28/20 am - 2 1/2 hrs after drinking coffee with sugar.   Have you had a dilated eye exam in the past 12 months? No    Have you had a dental exam in the past 12 months? No    Are you checking your feet? No      Dietary Intake   Breakfast usually skips and only drinks coffee with sugar    Snack (morning) reports 1-2 snacks day - almonds, chips, tuna and crackers, fruit - banana, apple    Lunch eggs, pizza    Dinner eggs with English muffin, chicken, beef, pork, tuna; potatoes, corn, beans, green beans, broccoli, rice, pasta, tomatoes, cuccumbers, asparagus, peppers    Beverage(s) water, coffee with sugar      Exercise   Exercise Type ADL's      Patient Education   Previous Diabetes Education No    Disease state  Definition of diabetes, type 1 and 2, and the diagnosis of diabetes;Factors that contribute to the development of diabetes  Nutrition management  Role of diet in the treatment of diabetes and the relationship between the three main macronutrients and blood glucose level;Food label reading, portion sizes and measuring food.;Reviewed blood glucose goals for pre and post meals and how to evaluate the patients' food intake on their blood glucose level.    Physical activity and exercise  Role of exercise on diabetes management, blood pressure control and cardiac health.    Medications Other (comment)   Limited use oral medications  during pregnancy and potential for insulin.   Monitoring Taught/evaluated SMBG meter.;Purpose and frequency of SMBG.;Taught/discussed recording of test results and interpretation of SMBG.;Ketone testing, when, how.    Chronic complications Relationship between chronic complications and blood glucose control    Psychosocial adjustment Identified and addressed patients feelings and concerns about diabetes    Preconception care Pregnancy and GDM  Role of pre-pregnancy blood glucose control on the development of the fetus;Reviewed with patient blood glucose goals with pregnancy;Role of family planning for patients with diabetes    Personal strategies to promote health Review risk of smoking and offered smoking cessation      Individualized Goals (developed by patient)   Reducing Risk Other (comment)   improve blood sugars, decrease medications, prevent diabetes complications, lose weight, lead a healthier lifestyle, quit smoking     Outcomes   Expected Outcomes Demonstrated interest in learning. Expect positive outcomes           Individualized Plan for Diabetes Self-Management Training:   Learning Objective:  Patient will have a greater understanding of diabetes self-management. Patient education plan is to attend individual and/or group sessions per assessed needs and concerns.   Plan:   Patient Instructions  Read booklet on Gestational Diabetes Follow Gestational Meal Planning Guidelines Don't skip meals Limit fried foods Avoid sugar sweetened drinks Complete a 3 Day Food Record and bring to next appointment Check blood sugars 4 x day - before breakfast and 2 hrs after every meal and record  Bring blood sugar log to all appointments Call MD for prescription for meter strips and lancets Strips  Accu-Chek Guide Lancets   Accu-Chek Softclix Purchase urine ketone strips if instructed by MD and check urine ketones every am:  If + increase bedtime snack to 1 protein and 2 carbohydrate  servings Walk 20-30 minutes at least 5 x week if permitted by MD Decrease/quit smoking  Expected Outcomes:  Demonstrated interest in learning. Expect positive outcomes  Education material provided:  Gestational Booklet Gestational Meal Planning Guidelines Simple Meal Plan Viewed Gestational Diabetes Video Meter = Accu-Chek Guide Me 3 Day Food Record Goals for a Healthy Pregnancy  If problems or questions, patient to contact team via:  Sharion Settler, RN, CCM, CDCES [phone]  Future DSME appointment:  July 02, 2020 with the dietitian

## 2020-07-02 ENCOUNTER — Ambulatory Visit: Payer: Medicaid Other | Admitting: Dietician

## 2020-07-09 ENCOUNTER — Ambulatory Visit: Payer: Medicaid Other

## 2020-07-09 ENCOUNTER — Encounter: Payer: Medicaid Other | Admitting: Obstetrics & Gynecology

## 2020-07-09 DIAGNOSIS — O0991 Supervision of high risk pregnancy, unspecified, first trimester: Secondary | ICD-10-CM

## 2020-07-09 DIAGNOSIS — Z9889 Other specified postprocedural states: Secondary | ICD-10-CM

## 2020-07-10 ENCOUNTER — Other Ambulatory Visit: Payer: Self-pay | Admitting: Obstetrics

## 2020-07-10 DIAGNOSIS — Z9889 Other specified postprocedural states: Secondary | ICD-10-CM

## 2020-07-10 DIAGNOSIS — O0991 Supervision of high risk pregnancy, unspecified, first trimester: Secondary | ICD-10-CM

## 2020-07-14 ENCOUNTER — Other Ambulatory Visit: Payer: Self-pay

## 2020-07-14 ENCOUNTER — Ambulatory Visit (INDEPENDENT_AMBULATORY_CARE_PROVIDER_SITE_OTHER): Payer: Medicaid Other

## 2020-07-14 ENCOUNTER — Ambulatory Visit (INDEPENDENT_AMBULATORY_CARE_PROVIDER_SITE_OTHER): Payer: Medicaid Other | Admitting: Obstetrics

## 2020-07-14 VITALS — BP 118/74 | Wt 179.0 lb

## 2020-07-14 DIAGNOSIS — Z9889 Other specified postprocedural states: Secondary | ICD-10-CM | POA: Diagnosis not present

## 2020-07-14 DIAGNOSIS — Z3A17 17 weeks gestation of pregnancy: Secondary | ICD-10-CM

## 2020-07-14 DIAGNOSIS — O0991 Supervision of high risk pregnancy, unspecified, first trimester: Secondary | ICD-10-CM | POA: Diagnosis not present

## 2020-07-14 DIAGNOSIS — O0990 Supervision of high risk pregnancy, unspecified, unspecified trimester: Secondary | ICD-10-CM

## 2020-07-14 NOTE — Progress Notes (Signed)
No vb. No lof. Cervical length u/s today

## 2020-07-14 NOTE — Progress Notes (Signed)
Routine Prenatal Care Visit  Subjective  Tami Foster is a 40 y.o. [phone] at [redacted]w[redacted]d being seen today for ongoing prenatal care.  She is currently monitored for the following issues for this high-risk pregnancy and has Anxiety; Hirsutism; Oligomenorrhea; PCOS (polycystic ovarian syndrome); Smoker; History of female hirsutism; ASCUS favor benign; Adjustment disorder with mixed anxiety and depressed mood; Obesity, Class I, BMI 30-34.9; Chronic fatigue; Hypertension affecting pregnancy in first trimester; Tachycardia; Multigravida of advanced maternal age in first trimester; Supervision of high risk pregnancy in first trimester; and H/O LEEP on their problem list.  ----------------------------------------------------------------------------------- Patient reports having increased anxiety daily- feels her heart racing, stress. she has lengthy hx of anxiety and stopped her Effexoer per her PCP. Now GDM (failed 3 hr GTT) and is starting to track her glucose levels> Cervical length today..    Lockie Pares. Bleeding: None.   . Leaking Fluid denies.  ----------------------------------------------------------------------------------- The following portions of the patient's history were reviewed and updated as appropriate: allergies, current medications, past family history, past medical history, past social history, past surgical history and problem list. Problem list updated.  Objective  Blood pressure 118/74, weight 179 lb (81.2 kg), last menstrual period 03/16/2020. Pregravid weight 170 lb (77.1 kg) Total Weight Gain 9 lb (4.082 kg) Urinalysis: Urine Protein    Urine Glucose    Fetal Status:           General:  Alert, oriented and cooperative. Patient is in no acute distress.  Skin: Skin is warm and dry. No rash noted.   Cardiovascular: Normal heart rate noted  Respiratory: Normal respiratory effort, no problems with respiration noted  Abdomen: Soft, gravid, appropriate for gestational age.  Pain/Pressure: Absent     Pelvic:  Cervical exam deferred        Extremities: Normal range of motion.     Mental Status: Normal mood and affect. Normal behavior. Normal judgment and thought content.   Assessment   40 y.o. [id number] at [redacted]w[redacted]d by  12/21/2020, by Last Menstrual Period presenting for routine prenatal visit  Plan   THIRD Problems (from 05/28/20 to present)    Problem Noted Resolved   Supervision of high risk pregnancy in first trimester 05/28/2020 by Zipporah Plants, CNM No   Overview Addendum 06/11/2020  8:37 AM by Nadara Mustard, MD     Nursing Staff Provider  Office Location  [facility] Dating   LMP = 10w [place]  Language  English Anatomy US    Flu Vaccine   declines Genetic Screen  NIPS:nml XY  TDaP vaccine    Hgb A1C or  GTT Early : ordered Third trimester :   Rhogam   n/a   LAB RESULTS   Feeding Plan  breast Blood Type B/Positive/-- (01/20 1627)   Contraception  Antibody Negative (01/20 1627)  Circumcision  Rubella 9.51 (01/20 1627)  Pediatrician   RPR Non Reactive (01/20 1627)   Support Person  Leonette Most (Buddy) - Husband HBsAg Negative (01/20 1627)   Prenatal Classes  HIV Non Reactive (01/20 1627)    Varicella  IMM  BTL Consent  n/a GBS  (For PCN allergy, check sensitivities)        VBAC Consent  n/a Pap  08/16/19 - NILM    Hgb Electro      CF      SMA         High Risk Pregnancy Diagnoses  AMA Obesity class I (pregravid BMI >30) cHTN (on metoprolol prior to pregnancy) Hx of PTL (PPROM  at 34w, NSVD, hx of LEEP)      Previous Version       Preterm labor symptoms and general obstetric precautions including but not limited to vaginal bleeding, contractions, leaking of fluid and fetal movement were reviewed in detail with the patient. Please refer to After Visit Summary for other counseling recommendations.  Nees dietary appt for her diabetes. Most initial glucose levels are within range. Anatomy scan in two weeks. Needs MD visit. Smoking a lot after stopping  her Effexor- advised to start back on the Effexor at 75 mg daily. Risks of cigs addressed today.  Return in about 2 weeks (around 07/28/2020) for return OB, anatomy scan needs MD visits. (GDM).  Mirna Mires, CNM  07/14/2020 9:37 AM

## 2020-07-16 ENCOUNTER — Other Ambulatory Visit: Payer: Self-pay | Admitting: Obstetrics & Gynecology

## 2020-07-24 NOTE — Progress Notes (Signed)
Cervical length at 17 weeks is 3.4 cms. No funneling seen. Mirna Mires, CNM  07/24/2020 4:30 PM

## 2020-07-27 ENCOUNTER — Telehealth: Payer: Self-pay | Admitting: Dietician

## 2020-07-27 NOTE — Telephone Encounter (Signed)
Called patient to reschedule her appointment from 07/02/20 which was cancelled due to staff illness. Left a voicemail message requesting a call back.

## 2020-07-29 ENCOUNTER — Ambulatory Visit (INDEPENDENT_AMBULATORY_CARE_PROVIDER_SITE_OTHER): Payer: Medicaid Other

## 2020-07-29 ENCOUNTER — Encounter: Payer: Self-pay | Admitting: Obstetrics & Gynecology

## 2020-07-29 ENCOUNTER — Ambulatory Visit (INDEPENDENT_AMBULATORY_CARE_PROVIDER_SITE_OTHER): Payer: Medicaid Other | Admitting: Obstetrics & Gynecology

## 2020-07-29 ENCOUNTER — Other Ambulatory Visit: Payer: Self-pay

## 2020-07-29 VITALS — BP 110/68 | Wt 172.0 lb

## 2020-07-29 DIAGNOSIS — Z9889 Other specified postprocedural states: Secondary | ICD-10-CM

## 2020-07-29 DIAGNOSIS — O0990 Supervision of high risk pregnancy, unspecified, unspecified trimester: Secondary | ICD-10-CM

## 2020-07-29 DIAGNOSIS — Z3A17 17 weeks gestation of pregnancy: Secondary | ICD-10-CM

## 2020-07-29 DIAGNOSIS — O162 Unspecified maternal hypertension, second trimester: Secondary | ICD-10-CM

## 2020-07-29 DIAGNOSIS — Z3A19 19 weeks gestation of pregnancy: Secondary | ICD-10-CM

## 2020-07-29 DIAGNOSIS — O09522 Supervision of elderly multigravida, second trimester: Secondary | ICD-10-CM

## 2020-07-29 DIAGNOSIS — E66811 Obesity, class 1: Secondary | ICD-10-CM

## 2020-07-29 DIAGNOSIS — O0992 Supervision of high risk pregnancy, unspecified, second trimester: Secondary | ICD-10-CM

## 2020-07-29 DIAGNOSIS — E669 Obesity, unspecified: Secondary | ICD-10-CM

## 2020-07-29 DIAGNOSIS — O24419 Gestational diabetes mellitus in pregnancy, unspecified control: Secondary | ICD-10-CM

## 2020-07-29 LAB — POCT URINALYSIS DIPSTICK OB: Glucose, UA: NEGATIVE

## 2020-07-29 NOTE — Progress Notes (Addendum)
No pain or bleeding, min nausea, keeping BS log on phone

## 2020-07-29 NOTE — Progress Notes (Signed)
Subjective  Fetal Movement? yes Nausea? no  BS log, not complete, needs more checks daily    There are some that are >120.  She is not checking fasting very often to have a pattern.  No pain, bleeding, s/sx  PTL.  Has not decided on Makena weekly injections yet.  Denies ha, blurry vision,m CP, SOB, edema.  Not on BP meds.  Anxiety, off of meds, no need for any now but is having good days and bad.  Has stopped working due to stress there.  Objective  BP 110/68   Wt 172 lb (78 kg)   LMP 03/16/2020 (Approximate)   BMI 31.46 kg/m  General: NAD Pumonary: no increased work of breathing Abdomen: gravid, non-tender Extremities: no edema Psychiatric: mood appropriate, affect full  Review of ULTRASOUND. I have personally reviewed images and report of recent ultrasound done at [facility]. There is a singleton gestation with subjectively normal amniotic fluid volume. The fetal biometry correlates with established dating. Detailed evaluation of the fetal anatomy was performed.The fetal anatomical survey appears within normal limits within the resolution of ultrasound as described above.  It must be noted that a normal ultrasound is unable to rule out fetal aneuploidy.    Cervix 3.4 cm on US exam, reassuring as she is s/p LEEP many years ago.    Assessment  40 y.o. [id number] at [redacted]w[redacted]d by  12/21/2020, by Last Menstrual Period presenting for routine prenatal visit  Plan   Problem List Items Addressed This Visit    Hypertension affecting pregnancy    No meds, normal BP today    Monitor especially third trimester   Obesity, Class I, BMI 30-34.9    Glucola was abnormal, now dx w GDM, see below    Baby ASA daily   Multigravida of advanced maternal age in first trimester    - NIPT discussed, [place] as well   Supervision of high risk pregnancy in first trimester     - PNV    - [place] discussed today   H/O LEEP    - Normal cervical checks, no need for further, as risk is low   [redacted] weeks gestation of  pregnancy       Gestational diabetes mellitus (GDM) in second trimester, gestational diabetes method of control unspecified        - Needs better checks to establish pattern    - May need Insulin, re-assess next visit H/o PPROM 34 weeks    - Counseled again as to the pros and cons of weekly 17 OHP injections for the prevention of PTL    - Pt declines at this time    - PTL precautions discussed        Nursing Staff Provider  Office Location  [facility] Dating   LMP = 10w [place]  Language  English Anatomy US  [facility]  Flu Vaccine   declines Genetic Screen  NIPS:nml XY  TDaP vaccine    Hgb A1C or  GTT Early : ordered Third trimester :   Rhogam   n/a   LAB RESULTS   Feeding Plan  breast Blood Type B/Positive/-- (01/20 1627)   Contraception  Antibody Negative (01/20 1627)  Circumcision  Rubella 9.51 (01/20 1627)  Pediatrician   RPR Non Reactive (01/20 1627)   Support Person  Tami (Buddy) - Husband HBsAg Negative (01/20 1627)   Prenatal Classes  HIV Non Reactive (01/20 1627)    Varicella  IMM  BTL Consent  n/a GBS  (For PCN allergy, check  sensitivities)        VBAC Consent  n/a Pap  08/16/19 - NILM    Hgb Electro      CF      SMA         High Risk Pregnancy Diagnoses  AMA Obesity class I (pregravid BMI >30) cHTN (on metoprolol prior to pregnancy) Hx of PTL (PPROM at 34w, NSVD, hx of LEEP)        Tami Major, MD, [facility], [facility] 07/29/2020  11:31 AM

## 2020-07-29 NOTE — Patient Instructions (Signed)
Gestational Diabetes Mellitus, Self-Care When you have gestational diabetes mellitus, you must make sure your blood sugar (glucose) stays at a healthy level. What are the risks? If you do not get treated for this condition, it may cause problems for you and your unborn baby. For the mother  Giving birth to the baby early.  Having problems during labor and when giving birth.  Needing surgery to give birth to the baby (cesarean delivery).  Having problems with blood pressure.  Getting this form of diabetes again when pregnant.  Getting type 2 diabetes in the future. For the baby  Low blood sugar.  Bigger body size than is normal.  Breathing problems. How to monitor blood sugar Check your blood sugar every day while you are pregnant. Check it as often as told by your doctor. To do this: 1. Wash your hands with soap and water for at least 20 seconds. 2. Prick the side of your finger (not the tip) with the lancet. Use a different finger each time. 3. Gently rub the finger until a small drop of blood appears. 4. Follow instructions that come with your meter for: ? Putting in the test strip. ? Putting blood on the strip. ? Getting the result. 5. Write down your result and any notes. In general, your blood sugar levels should be:  95 mg/dL (5.3 mmol/L) if you have not eaten.  140 mg/dL (7.8 mmol/L) 1 hour after a meal.  120 mg/dL (6.7 mmol/L) 2 hours after a meal.   Follow these instructions at home: Medicines  Take over-the-counter and prescription medicines only as told by your doctor.  If your doctor prescribed insulin or other diabetes medicines: ? Take them every day. ? Do not run out of insulin or other medicines. Plan ahead so you always have them. Eating and drinking  Follow instructions from your doctor about eating or drinking restrictions.  See a food expert (dietician) to help you create an eating plan that helps control your blood sugar. The foods in this  plan will include: ? Low-fat proteins. ? Dried beans, nuts, and whole grain breads, cereals, or pasta. ? Fresh fruits and vegetables. ? Low-fat dairy products. ? Healthy fats.  Eat healthy snacks between healthy meals.  Drink enough fluid to keep your pee (urine) pale yellow.  Keep track of carbs that you eat. To do this: ? Read food labels. ? Learn the serving sizes of foods.  Follow your sick day plan when you cannot eat or drink normally. Make this plan with your doctor so it is ready to use.   Activity  Do exercises as told by your doctor.  Exercise for 30 or more minutes a day, or as much as your doctor recommends. To help you control blood sugar levels after a meal: ? Do 10 minutes of exercise after each meal. ? Start this exercise 30 minutes after the meal.  Talk with your doctor before you start a new exercise. Your doctor may tell you to change your insulin, other medicines, or food. Lifestyle  Do not drink alcohol.  Do not use any products that contain nicotine or tobacco, such as cigarettes, e-cigarettes, and chewing tobacco. If you need help quitting, ask your doctor.  Learn how to deal with stress. If you need help with this, ask your doctor. Body care  Stay up to date with your shots (vaccines).  Take good care of your teeth. To do this: ? Brush your teeth and gums two times a day. ?   Floss one or more times a day. ? Go to the dentist one or more times every 6 months.  Stay at a healthy weight while you are pregnant. General instructions  Ask your doctor about risks of high blood pressure in pregnancy.  Share your diabetes care plan with: ? Your work or school. ? People you live with.  Check your pee for ketones: ? When you are sick. ? As told by your doctor.  Carry a card or wear a bracelet that says you have diabetes.  Keep all follow-up visits. Care after giving birth  Have your blood sugar checked 4-12 weeks after you give birth.  Get  checked for diabetes one or more times every 3 years or as told. Where to find more information  American Diabetes Association (ADA): diabetes.org  Association of Diabetes Care & Education Specialists (ADCES): diabeteseducator.org  Centers for Disease Control and Prevention (CDC): cdc.gov  American Pregnancy Association: americanpregnancy.org  [place] Department of Agriculture MyPlate: myplate.gov Contact a doctor if:  Your blood sugar is above your target for two tests in a row.  You have a fever.  You are sick for 2 days or more and do not get better.  You have either of these problems for more than 6 hours: ? You vomit every time you eat or drink. ? You have watery poop (diarrhea). Get help right away if:  You cannot think clearly.  You have trouble breathing.  You have moderate or high ketones in your pee.  Blood or abnormal fluid starts to come out of your vagina.  You feel your baby is not moving as usual.  You start having early contractions. You may feel your belly tighten.  You have a very bad headache. These symptoms may be an emergency. Get help right away. Call your local emergency services (911 in the [place]  Do not wait to see if the symptoms will go away.  Do not drive yourself to the hospital. Summary  Check your blood sugar (glucose) while you are pregnant. Check it as often as told by your doctor.  Take your insulin and diabetes medicines as told.  Have your blood sugar checked 4-12 weeks after you give birth.  Keep all follow-up visits. This information is not intended to replace advice given to you by your health care provider. Make sure you discuss any questions you have with your health care provider. Document Revised: 09/30/2019 Document Reviewed: 09/30/2019 Elsevier Patient Education  2021 [employer].  

## 2020-08-06 ENCOUNTER — Encounter: Payer: Self-pay | Admitting: Dietician

## 2020-08-06 NOTE — Progress Notes (Signed)
Have not heard back from patient to reschedule her appointment from 07/02/20 (cancelled due to staff issue). Sent notification to referring provider.

## 2020-08-12 ENCOUNTER — Other Ambulatory Visit: Payer: Self-pay

## 2020-08-12 ENCOUNTER — Ambulatory Visit (INDEPENDENT_AMBULATORY_CARE_PROVIDER_SITE_OTHER): Payer: Medicaid Other | Admitting: Obstetrics & Gynecology

## 2020-08-12 ENCOUNTER — Encounter: Payer: Self-pay | Admitting: Obstetrics & Gynecology

## 2020-08-12 DIAGNOSIS — O10012 Pre-existing essential hypertension complicating pregnancy, second trimester: Secondary | ICD-10-CM | POA: Diagnosis not present

## 2020-08-12 DIAGNOSIS — O24410 Gestational diabetes mellitus in pregnancy, diet controlled: Secondary | ICD-10-CM | POA: Diagnosis not present

## 2020-08-12 DIAGNOSIS — O3442 Maternal care for other abnormalities of cervix, second trimester: Secondary | ICD-10-CM | POA: Diagnosis not present

## 2020-08-12 DIAGNOSIS — Z3A21 21 weeks gestation of pregnancy: Secondary | ICD-10-CM | POA: Diagnosis not present

## 2020-08-12 DIAGNOSIS — O162 Unspecified maternal hypertension, second trimester: Secondary | ICD-10-CM

## 2020-08-12 DIAGNOSIS — Z9889 Other specified postprocedural states: Secondary | ICD-10-CM

## 2020-08-12 DIAGNOSIS — O99212 Obesity complicating pregnancy, second trimester: Secondary | ICD-10-CM

## 2020-08-12 DIAGNOSIS — E669 Obesity, unspecified: Secondary | ICD-10-CM

## 2020-08-12 DIAGNOSIS — Z9289 Personal history of other medical treatment: Secondary | ICD-10-CM | POA: Diagnosis not present

## 2020-08-12 DIAGNOSIS — O24419 Gestational diabetes mellitus in pregnancy, unspecified control: Secondary | ICD-10-CM

## 2020-08-12 DIAGNOSIS — O09522 Supervision of elderly multigravida, second trimester: Secondary | ICD-10-CM | POA: Diagnosis not present

## 2020-08-12 DIAGNOSIS — O0992 Supervision of high risk pregnancy, unspecified, second trimester: Secondary | ICD-10-CM

## 2020-08-12 NOTE — Progress Notes (Signed)
Virtual Visit via Telephone Note  I connected with patient on 08/12/20 at 11:00 AM EDT by telephone and verified that I am speaking with the correct person using two identifiers.   I discussed the limitations, risks, security and privacy concerns of performing an evaluation and management service by telephone and the availability of in person appointments. I also discussed with the patient that there may be a patient responsible charge related to this service. The patient expressed understanding and agreed to proceed.  The patient was at home I spoke with the patient from my  office  Tami Foster is a 40 y.o. [phone] at [redacted]w[redacted]d being seen today for ongoing prenatal care.  She is currently monitored for the following issues for this high-risk pregnancy and has Anxiety; Hirsutism; Oligomenorrhea; PCOS (polycystic ovarian syndrome); Smoker; History of female hirsutism; ASCUS favor benign; Adjustment disorder with mixed anxiety and depressed mood; Obesity, Class I, BMI 30-34.9; Chronic fatigue; Hypertension affecting pregnancy in first trimester; Tachycardia; Multigravida of advanced maternal age in first trimester; Supervision of high risk pregnancy in first trimester; and H/O LEEP on their problem list.  ----------------------------------------------------------------------------------- Patient reports rapid heart rate at times (prior hx).  Denies ha, blurry vision, cp, sob, edema, epigastric pain.  See BS log, fasting BS >95 at about 50% of time.  PP BS all normal. .   Denies pain, VB, leaking of fluid.  ----------------------------------------------------------------------------------- The following portions of the patient's history were reviewed and updated as appropriate: allergies, current medications, past family history, past medical history, past social history, past surgical history and problem list. Problem list updated.   Objective  Last menstrual period 03/16/2020. Pregravid weight 170  lb (77.1 kg) Total Weight Gain 2 lb (0.907 kg)  Physical Exam could not be performed. Because of the COVID-19 outbreak this visit was performed over the phone and not in person.   Assessment   40 y.o. [id number] at [redacted]w[redacted]d by  12/21/2020, by Last Menstrual Period presenting for routine prenatal visit  Plan    Problem Noted Resolved   Supervision of high risk pregnancy in first trimester     Overview Addendum 07/29/2020  6:47 PM by Mirna Mires, CNM     Nursing Staff Provider  Office Location  [facility] Dating   LMP = 10w [place]  Language  English Anatomy US  Normal female  Flu Vaccine   declines Genetic Screen  NIPS:nml XY  TDaP vaccine    Hgb A1C or  GTT Early : ordered Third trimester :   Rhogam   n/a   LAB RESULTS   Feeding Plan  breast Blood Type B/Positive/-- (01/20 1627)   Contraception  Antibody Negative (01/20 1627)  Circumcision  Rubella 9.51 (01/20 1627)  Pediatrician   RPR Non Reactive (01/20 1627)   Support Person  Leonette Most (Buddy) - Husband HBsAg Negative (01/20 1627)   Prenatal Classes  HIV Non Reactive (01/20 1627)    Varicella  IMM  BTL Consent  n/a GBS  (For PCN allergy, check sensitivities)        VBAC Consent  n/a Pap  08/16/19 - NILM    Hgb Electro      CF      SMA         High Risk Pregnancy Diagnoses  AMA Obesity class I (pregravid BMI >30) cHTN (on metoprolol prior to pregnancy) Hx of PTL (PPROM at 34w, NSVD, hx of LEEP)     Cont diet controlled DM control, see about qhs dose of Insulin  if fastings go up   PNV, diet, exercise discussed  No s/sx HTN or preeclampsia, Baby ASA advised.   No s/sx PTL.    Gestational age appropriate obstetric precautions including but not limited to vaginal bleeding, contractions, leaking of fluid and fetal movement were reviewed in detail with the patient.     Follow Up Instructions: 2 weeks    I discussed the assessment and treatment plan with the patient. The patient was provided an opportunity to ask questions and  all were answered. The patient agreed with the plan and demonstrated an understanding of the instructions.   The patient was advised to call back or seek an in-person evaluation if the symptoms worsen or if the condition fails to improve as anticipated.  I provided 15 minutes of non-face-to-face time during this encounter.  Return in about 2 weeks (around 08/26/2020) for HROB.  Annamarie Major, MD [facility], [facility] 08/12/2020 10:52 AM

## 2020-08-25 ENCOUNTER — Encounter: Payer: Self-pay | Admitting: Obstetrics and Gynecology

## 2020-08-25 ENCOUNTER — Other Ambulatory Visit: Payer: Self-pay | Admitting: Obstetrics and Gynecology

## 2020-08-25 ENCOUNTER — Ambulatory Visit (INDEPENDENT_AMBULATORY_CARE_PROVIDER_SITE_OTHER): Payer: Medicaid Other | Admitting: Cardiovascular Disease

## 2020-08-25 ENCOUNTER — Ambulatory Visit (INDEPENDENT_AMBULATORY_CARE_PROVIDER_SITE_OTHER): Payer: Medicaid Other | Admitting: Obstetrics and Gynecology

## 2020-08-25 ENCOUNTER — Ambulatory Visit (INDEPENDENT_AMBULATORY_CARE_PROVIDER_SITE_OTHER): Payer: Medicaid Other

## 2020-08-25 ENCOUNTER — Other Ambulatory Visit: Payer: Self-pay

## 2020-08-25 ENCOUNTER — Encounter: Payer: Self-pay | Admitting: Cardiovascular Disease

## 2020-08-25 VITALS — BP 118/70 | Ht 62.0 in | Wt 168.8 lb

## 2020-08-25 VITALS — BP 112/64 | HR 97 | Ht 62.0 in | Wt 168.0 lb

## 2020-08-25 DIAGNOSIS — Z3A23 23 weeks gestation of pregnancy: Secondary | ICD-10-CM

## 2020-08-25 DIAGNOSIS — O0991 Supervision of high risk pregnancy, unspecified, first trimester: Secondary | ICD-10-CM

## 2020-08-25 DIAGNOSIS — I479 Paroxysmal tachycardia, unspecified: Secondary | ICD-10-CM | POA: Diagnosis not present

## 2020-08-25 DIAGNOSIS — O24419 Gestational diabetes mellitus in pregnancy, unspecified control: Secondary | ICD-10-CM

## 2020-08-25 DIAGNOSIS — O09521 Supervision of elderly multigravida, first trimester: Secondary | ICD-10-CM

## 2020-08-25 DIAGNOSIS — R000 Tachycardia, unspecified: Secondary | ICD-10-CM

## 2020-08-25 LAB — POCT URINALYSIS DIPSTICK OB
Glucose, UA: NEGATIVE
POC,PROTEIN,UA: NEGATIVE

## 2020-08-25 NOTE — Progress Notes (Signed)
Cardiology Office Note  Date:  08/25/2020   ID:  Rockell Faulks, DOB 28-Mar-1981, MRN [id number]  PCP:  Emi Belfast, FNP (Inactive)   Chief Complaint  Patient presents with  . New Patient (Initial Visit)    Referred by Adelene Idler, MD for Tachycardia    HPI:  Ms. Ranessa Kosta is a 40 year old woman with past medical history of Gestational diabetes Anxiety Tachycardia [redacted] weeks pregnant, presenting by referral from OB/GYN/Dr. Gaynelle Arabian for consultation of her tachycardia  [redacted] weeks pregnant, Reports having some high sugars Some dietary indiscretion Prior history of anxiety, most of her medications were held at the beginning of her pregnancy including Effexor  -Prior history of tachycardia, has been maintained on metoprolol succinate 100 mg but this was held at beginning of pregnancy Initially doing fine off the metoprolol, has noticed increased heart rate more recently Unclear if it is from anxiety or baseline tachycardia Having elevated heart rates at rest and with exertion  Typically feels well at rest No regular exercise program Very busy, takes care of several children  EKG personally reviewed by myself on todays visit Shows normal sinus rhythm rate 97 bpm no significant ST or T wave changes   PMH:   has a past medical history of Abnormal Pap smear, Anxiety, Depression, GERD (gastroesophageal reflux disease), Gestational diabetes, and Hypertension.  PSH:    Past Surgical History:  Procedure Laterality Date  . LEEP    . PERIURETHRAL ABSCESS DRAINAGE     abscess removed from tonsils. Tonsils not removed  . WISDOM TOOTH EXTRACTION      Current Outpatient Medications  Medication Sig Dispense Refill  . Accu-Chek Softclix Lancets lancets Test your glucose levels every morning upon awakening, then two hours after breakfast, lunch and dinner. 100 each 6  . albuterol (PROVENTIL HFA;VENTOLIN HFA) 108 (90 Base) MCG/ACT inhaler Inhale 2 puffs into the lungs every  4 (four) hours as needed for wheezing or shortness of breath (cough, shortness of breath or wheezing.). 1 Inhaler 1  . ALPRAZolam (XANAX) 1 MG tablet Take 1 mg by mouth daily as needed.    . Butalbital-APAP-Caffeine 50-325-40 MG capsule Take 1-2 capsules by mouth every 6 (six) hours as needed for headache. 30 capsule 1  . Doxylamine-Pyridoxine (DICLEGIS) 10-10 MG TBEC Take 2 tablets by mouth at bedtime. If symptoms persist, add one tablet in the morning and one in the afternoon 100 tablet 5  . glucose blood (ACCU-CHEK GUIDE) test strip Test your glucose level every morning : first upon awakening as a fasting level, then 2 hours after breakfast, lunch and dinner. 100 strip 12  . ipratropium (ATROVENT) 0.06 % nasal spray   0  . ondansetron (ZOFRAN-ODT) 4 MG disintegrating tablet TAKE 1 TABLET BY MOUTH EVERY 6 HOURS AS NEEDED FOR NAUSEA 20 tablet 0  . Prenatal Vit-Fe Fumarate-FA (PRENATAL PO) Take by mouth.     No current facility-administered medications for this visit.     Allergies:   Patient has no known allergies.   Social History:  The patient  reports that she has been smoking cigarettes. She has a 7.00 pack-year smoking history. She has never used smokeless tobacco. She reports previous alcohol use. She reports that she does not use drugs.   Family History:   family history includes Cancer in her mother; Cancer (age of onset: 48) in her father; Diabetes in her paternal grandmother; Emphysema in her father; Hypertension in her father and mother.    Review of Systems: Review of  Systems  Constitutional: Negative.   HENT: Negative.   Respiratory: Negative.   Cardiovascular: Negative.   Gastrointestinal: Negative.   Musculoskeletal: Negative.   Neurological: Negative.   Psychiatric/Behavioral: Negative.   All other systems reviewed and are negative.   PHYSICAL EXAM: VS:  BP 112/64   Pulse 97   Ht 5\' 2"  (1.575 m)   Wt 168 lb (76.2 kg)   LMP 03/16/2020 (Approximate)   BMI 30.73  kg/m  , BMI Body mass index is 30.73 kg/m. GEN: Well nourished, well developed, in no acute distress HEENT: normal Neck: no JVD, carotid bruits, or masses Cardiac: RRR; no murmurs, rubs, or gallops,no edema  Respiratory:  clear to auscultation bilaterally, normal work of breathing GI: soft, nontender, nondistended, + BS MS: no deformity or atrophy Skin: warm and dry, no rash Neuro:  Strength and sensation are intact Psych: euthymic mood, full affect   Recent Labs: 05/28/2020: ALT 20; BUN 7; Creatinine, Ser 0.44; Hemoglobin 13.5; Platelets 211; Potassium 3.5; Sodium 134    Lipid Panel Lab Results  Component Value Date   CHOL 172 09/22/2017   HDL 53.30 09/22/2017   LDLCALC 103 (H) 09/22/2017   TRIG 79.0 09/22/2017      Wt Readings from Last 3 Encounters:  08/25/20 168 lb (76.2 kg)  08/25/20 168 lb 12.8 oz (76.6 kg)  07/29/20 172 lb (78 kg)      ASSESSMENT AND PLAN:  Problem List Items Addressed This Visit   None   Visit Diagnoses    Paroxysmal tachycardia (HCC)    -  Primary   Relevant Orders   EKG 12-Lead   LONG TERM MONITOR (3-14 DAYS)   [redacted] weeks gestation of pregnancy       Relevant Orders   EKG 12-Lead     Tachycardia She reports history of baseline tachycardia well maintained on metoprolol succinate 100 mg daily before pregnancy Suspect sinus tachycardia, in general is relatively asymptomatic ZIO monitor ordered to get better idea of her heart rate at rest, with exertion to determine if beta-blockers might be needed We did discuss potentially using labetalol/atenolol as needed Will wait for ZIO monitor results Otherwise normal EKG, normal clinical exam, no further testing ordered For worsening symptoms, additional work-up such as echocardiogram could be performed    Total encounter time more than 60 minutes  Greater than 50% was spent in counseling and coordination of care with the patient  Patient seen in consultation for Dr. 07/31/20 and be referred  back to her office for ongoing care of the issues detailed above  Signed, Jerene Pitch, M.D., Ph.D. [facility] [place], [place]

## 2020-08-25 NOTE — Progress Notes (Signed)
Routine Prenatal Care Visit  Subjective  Tami Foster is a 40 y.o. [phone] at [redacted]w[redacted]d being seen today for ongoing prenatal care.  She is currently monitored for the following issues for this high-risk pregnancy and has Anxiety; Hirsutism; Oligomenorrhea; PCOS (polycystic ovarian syndrome); Smoker; History of female hirsutism; ASCUS favor benign; Adjustment disorder with mixed anxiety and depressed mood; Obesity, Class I, BMI 30-34.9; Chronic fatigue; Hypertension affecting pregnancy in first trimester; Tachycardia; Multigravida of advanced maternal age in first trimester; Supervision of high risk pregnancy in first trimester; and H/O LEEP on their problem list.  ----------------------------------------------------------------------------------- Patient reports having a racing heart beat, but to the 150s sometimes at rest. She is not sure of this is related to a heart issue or anxiety. Her anxiety as been less controlled since she discontinued effexor early in her pregnancy.   Contractions: Irregular. Vag. Bleeding: None.  Movement: Present. Denies leaking of fluid.  ----------------------------------------------------------------------------------- The following portions of the patient's history were reviewed and updated as appropriate: allergies, current medications, past family history, past medical history, past social history, past surgical history and problem list. Problem list updated.   Objective  Blood pressure 118/70, height 5\' 2"  (1.575 m), weight 168 lb 12.8 oz (76.6 kg), last menstrual period 03/16/2020. Pregravid weight 170 lb (77.1 kg) Total Weight Gain -1 lb 3.2 oz (-0.544 kg) Urinalysis:      Fetal Status: Fetal Heart Rate (bpm): 150 Fundal Height: 23 cm Movement: Present     General:  Alert, oriented and cooperative. Patient is in no acute distress.  Skin: Skin is warm and dry. No rash noted.   Cardiovascular: Normal heart rate noted  Respiratory: Normal respiratory  effort, no problems with respiration noted  Abdomen: Soft, gravid, appropriate for gestational age. Pain/Pressure: Absent     Pelvic:  Cervical exam deferred        Extremities: Normal range of motion.  Edema: None  Mental Status: Normal mood and affect. Normal behavior. Normal judgment and thought content.     Assessment   40 y.o. 30 at [redacted]w[redacted]d by  12/21/2020, by Last Menstrual Period presenting for routine prenatal visit  Plan   THIRD Problems (from 05/28/20 to present)    Problem Noted Resolved   Supervision of high risk pregnancy in first trimester 05/28/2020 by 05/30/2020, CNM No   Overview Addendum 07/29/2020  6:47 PM by 07/31/2020, CNM     Nursing Staff Provider  Office Location  [facility] Dating   LMP = 10w Mirna Mires  Language  English Anatomy US  Normal female  Flu Vaccine   declines Genetic Screen  NIPS:nml XY  TDaP vaccine    Hgb A1C or  GTT Early : ordered Third trimester :   Rhogam   n/a   LAB RESULTS   Feeding Plan  breast Blood Type B/Positive/-- (01/20 1627)   Contraception  Antibody Negative (01/20 1627)  Circumcision  Rubella 9.51 (01/20 1627)  Pediatrician   RPR Non Reactive (01/20 1627)   Support Person  04-23[birth year]1989 (Buddy) - Husband HBsAg Negative (01/20 1627)   Prenatal Classes  HIV Non Reactive (01/20 1627)    Varicella  IMM  BTL Consent  n/a GBS  (For PCN allergy, check sensitivities)        VBAC Consent  n/a Pap  08/16/19 - NILM    Hgb Electro      CF      SMA         High Risk Pregnancy Diagnoses  AMA Obesity class I (pregravid BMI >30) cHTN (on metoprolol prior to pregnancy) Hx of PTL (PPROM at 34w, NSVD, hx of LEEP)      Previous Version       Gestational age appropriate obstetric precautions including but not limited to vaginal bleeding, contractions, leaking of fluid and fetal movement were reviewed in detail with the patient.    Return in about 2 weeks (around 09/08/2020) for ROB in person with MD.   Cardiology for  tachycardia  Discussed restarting effexorgiven her anxiety and long history of anxiwwty previously controllwd on effexor- she will consider. Discussed tapering up. Patient has rx at home for 37.5 mg pills. She know how to taper up. She was on 150 mg in the past. Declines therapy or psychiatry referral at this time.   Discussed timing of blood glucose checks. Patient sometimes checking before meals or more than 2 hours after meals. She generally does not eat breakfast or snacks on vegetables. Tends to eat dinner late and sometimes forgets checls.  Discussed checking at 1 hour if this is easier. Encouraged to note time of eating in relation to glucose checks.  Ordered growth [place] at 4 wk intervals.     Fasting After Breakfast After Lunch After Dinner  08/11/2020 97  81 85  08/12/2020 104  110/128 142  08/13/2020 102  133   08/14/2020 99   93  08/15/2020 90  104 103/145  08/16/2020 101     08/17/2020 94   92  08/18/2020   112/98 124/99  08/19/2020 93   106/102  08/20/2020 92   112/108  08/21/2020 92  123   08/22/2020 140 (coffee with sugar)  109/83 130/-  74/17/2022 99   83/93  08/24/2020 99  81 126/111  08/25/2020 102       Sterling Mondo MD, [facility], [facility] 08/25/2020 11:24 AM

## 2020-08-25 NOTE — Patient Instructions (Signed)
Gestational Diabetes Mellitus, Self-Care Caring for yourself after a diagnosis of gestational diabetes mellitus means keeping your blood sugar under control. This can be done through nutrition, exercise, lifestyle changes, insulin and other medicines, and support from your health care team. Your health care provider will set individualized treatment goals for you. What are the risks? If left untreated, gestational diabetes can cause problems for mother and baby. For the mother Women who get gestational diabetes are more likely to:  Have labor induced and deliver early.  Have problems during labor and delivery, if the baby is larger than normal. This includes difficult labor and damage to the birth canal.  Have a cesarean delivery.  Have problems with blood pressure, including high blood pressure and preeclampsia.  Get it again if they become pregnant.  Develop type 2 diabetes in the future. For the baby Gestational diabetes that is not treated can cause the baby to have:  Low blood glucose (hypoglycemia).  Larger-than-normal body size (macrosomia).  Breathing problems. How to monitor blood glucose Check your blood glucose every day and as often as told by your health care provider. To do this: 1. Wash your hands with soap and water for at least 20 seconds. 2. Prick the side of your finger (not the tip) with the lancet. Use a different finger each time. 3. Gently rub the finger until a small drop of blood appears. 4. Follow instructions that come with your meter for inserting the test strip, applying blood to the strip, and getting the result. 5. Write down your result and any notes. Blood glucose goals are:  95 mg/dL (5.3 mmol/L) when fasting.  140 mg/dL (7.8 mmol/L) 1 hour after a meal.  120 mg/dL (6.7 mmol/L) 2 hours after a meal.   Follow these instructions at home: Medicines  Take over-the-counter and prescription medicines only as told by your health care  provider.  If your health care provider prescribed insulin or other diabetes medicines: ? Take them every day. ? Do not run out of insulin or other medicines. Plan ahead so you always have them available. Eating and drinking  Follow instructions from your health care provider about eating or drinking restrictions.  See a diet and nutrition expert (registered dietician) to help you create an eating plan that helps control your diabetes. The foods in this plan will include: ? Lean proteins. ? Complex carbohydrates. These are carbohydrates that contain fiber, have a lot of nutrients, and are digested slowly. They include dried beans, nuts, and whole grain breads, cereals, or pasta. ? Fresh fruits and vegetables. ? Low-fat dairy products. ? Healthy fats.  Eat healthy snacks between nutritious meals.  Drink enough fluid to keep your urine pale yellow.  Keep a record of the carbohydrates that you eat. To do this: ? Read food labels. ? Learn the standard serving sizes of foods.  Make a sick day plan with your health care provider before you get sick. Follow this plan whenever you cannot eat or drink as usual.   Activity  Do exercises as told by your health care provider.  Do 30 or more minutes of physical activity a day, or as much physical activity as your health care provider recommends. It may help to control blood glucose levels after a meal if you: ? Do 10 minutes of exercise after each meal. ? Start this exercise 30 minutes after the meal.  If you start a new exercise or activity, work with your health care provider to adjust your   insulin, other medicines, or food as needed. Lifestyle  Do not drink alcohol.  Do not use any products that contain nicotine or tobacco, such as cigarettes, e-cigarettes, and chewing tobacco. If you need help quitting, ask your health care provider.  Learn to manage stress. If you need help with this, ask your health care provider. Body care  Keep  your vaccines up to date.  Practice good oral hygiene. To do this: ? Clean your teeth and gums two times a day. ? Floss one or more times a day. ? Visit your dentist one or more times every 6 months.  Stay at a healthy weight while you are pregnant. Your expected weight gain depends on your BMI (body mass index) before pregnancy. General instructions  Talk with your health care provider about the risk for high blood pressure during pregnancy (preeclampsia and eclampsia).  Share your diabetes management plan with people in your workplace, school, and household.  Check your urine for ketones when sick and as told by your health care provider. Ketones are made by the liver when a lack of glucose forces the body to use fat for energy.  Carry a medical alert card or wear medical alert jewelry that says you have gestational diabetes.  Keep all follow-up visits. This is important. Get care after delivery  Have your blood glucose level checked with an oral glucose tolerance test (OGTT) 4-12 weeks after delivery.  Get screened for diabetes at least every 3 years, or as often as told by your health care provider. Where to find more information  American Diabetes Association (ADA): diabetes.org  Association of Diabetes Care & Education Specialists (ADCES): diabeteseducator.org  Centers for Disease Control and Prevention (CDC): cdc.gov  American Pregnancy Association: americanpregnancy.org  [place].S. Department of Agriculture MyPlate: myplate.gov Contact a health care provider if:  Your blood glucose is above your target for two tests in a row.  You have a fever.  You have been sick for 2 days or more and are not getting better.  You have either of these problems for more than 6 hours: ? Vomiting every time you eat or drink. ? Diarrhea. Get help right away if you:  Become confused or cannot think clearly.  Have trouble breathing.  Have moderate or high ketones in your  urine.  Feel your baby is not moving as usual.  Develop unusual discharge or bleeding from your vagina.  Start having early (premature) contractions. Contractions may feel like a tightening in your lower abdomen  Have a severe headache. These symptoms may represent a serious problem that is an emergency. Do not wait to see if the symptoms will go away. Get medical help right away. Call your local emergency services (911 in the [place] Do not drive yourself to the hospital. Summary  Check your blood glucose every day during your pregnancy. Do this as often as told by your health care provider.  Take insulin or other diabetes medicines every day, if your health care provider prescribed them.  Have your blood glucose level checked 4-12 weeks after delivery.  Keep all follow-up visits. This is important. This information is not intended to replace advice given to you by your health care provider. Make sure you discuss any questions you have with your health care provider. Document Revised: 09/30/2019 Document Reviewed: 09/30/2019 Elsevier Patient Education  2021 [employer].  

## 2020-08-25 NOTE — Patient Instructions (Addendum)
Medication Instructions:  No changes  If you need a refill on your cardiac medications before your next appointment, please call your pharmacy.    Lab work: No new labs needed  Testing/Procedures: Zio monitor (paroxysmal tachycardia)   ZIO RESULTS WILL APPEAR ON MYCHART, WE WILL CALL WITH RESULTS ONCE DR. Rakeem Colley REVIEWS   Heart monitor (Zio patch) for 2 weeks (14 days)   Your physician has recommended that you wear a Zio monitor. This monitor is a medical device that records the heart's electrical activity. Doctors most often use these monitors to diagnose arrhythmias. Arrhythmias are problems with the speed or rhythm of the heartbeat. The monitor is a small device applied to your chest. You can wear one while you do your normal daily activities. While wearing this monitor if you have any symptoms to push the button and record what you felt. Once you have worn this monitor for the period of time provider prescribed (Usually 14 days), you will return the monitor device in the postage paid box. Once it is returned they will download the data collected and provide [place] with a report which the provider will then review and we will call you with those results. Important tips:  1. Avoid showering during the first 24 hours of wearing the monitor. 2. Avoid excessive sweating to help maximize wear time. 3. Do not submerge the device, no hot tubs, and no swimming pools. 4. Keep any lotions or oils away from the patch. 5. After 24 hours you may shower with the patch on. Take brief showers with your back facing the shower head.  6. Do not remove patch once it has been placed because that will interrupt data and decrease adhesive wear time. 7. Push the button when you have any symptoms and write down what you were feeling. 8. Once you have completed wearing your monitor, remove and place into box which has postage paid and place in your outgoing mailbox.  9. If for some reason you have misplaced your box  then call our office and we can provide another box and/or mail it off for you.       Follow-Up:  . You will need a follow up appointment as needed  . Providers on your designated Care Team:   . Nicolasa Ducking, NP . Eula Listen, PA-C . Marisue Ivan, PA-C  COVID-19 Vaccine Information can be found at: [email or web address] For questions related to vaccine distribution or appointments, please email [email or web address] or call [phone].

## 2020-08-25 NOTE — Addendum Note (Signed)
Addended by: Adelene Idler on: 08/25/2020 12:17 PM   Modules accepted: Orders

## 2020-09-04 ENCOUNTER — Other Ambulatory Visit: Payer: Self-pay | Admitting: Obstetrics & Gynecology

## 2020-09-08 ENCOUNTER — Encounter: Payer: Medicaid Other | Admitting: Obstetrics and Gynecology

## 2020-09-09 ENCOUNTER — Encounter: Payer: Medicaid Other | Admitting: Obstetrics and Gynecology

## 2020-09-09 ENCOUNTER — Ambulatory Visit: Payer: Medicaid Other | Admitting: *Deleted

## 2020-09-09 ENCOUNTER — Ambulatory Visit: Payer: Medicaid Other | Attending: Obstetrics and Gynecology

## 2020-09-09 ENCOUNTER — Encounter: Payer: Self-pay | Admitting: *Deleted

## 2020-09-09 ENCOUNTER — Ambulatory Visit: Payer: Medicaid Other

## 2020-09-09 ENCOUNTER — Other Ambulatory Visit: Payer: Self-pay | Admitting: *Deleted

## 2020-09-09 ENCOUNTER — Other Ambulatory Visit: Payer: Self-pay

## 2020-09-09 DIAGNOSIS — O0991 Supervision of high risk pregnancy, unspecified, first trimester: Secondary | ICD-10-CM

## 2020-09-09 DIAGNOSIS — O10912 Unspecified pre-existing hypertension complicating pregnancy, second trimester: Secondary | ICD-10-CM

## 2020-09-11 ENCOUNTER — Ambulatory Visit (INDEPENDENT_AMBULATORY_CARE_PROVIDER_SITE_OTHER): Payer: Medicaid Other | Admitting: Obstetrics and Gynecology

## 2020-09-11 ENCOUNTER — Other Ambulatory Visit: Payer: Self-pay

## 2020-09-11 VITALS — BP 118/74 | Wt 170.0 lb

## 2020-09-11 DIAGNOSIS — O162 Unspecified maternal hypertension, second trimester: Secondary | ICD-10-CM

## 2020-09-11 DIAGNOSIS — E282 Polycystic ovarian syndrome: Secondary | ICD-10-CM

## 2020-09-11 DIAGNOSIS — O0990 Supervision of high risk pregnancy, unspecified, unspecified trimester: Secondary | ICD-10-CM

## 2020-09-11 DIAGNOSIS — Z9889 Other specified postprocedural states: Secondary | ICD-10-CM

## 2020-09-11 DIAGNOSIS — Z3A25 25 weeks gestation of pregnancy: Secondary | ICD-10-CM

## 2020-09-11 DIAGNOSIS — L680 Hirsutism: Secondary | ICD-10-CM

## 2020-09-11 DIAGNOSIS — O09522 Supervision of elderly multigravida, second trimester: Secondary | ICD-10-CM

## 2020-09-11 LAB — POCT URINALYSIS DIPSTICK OB
Glucose, UA: NEGATIVE
POC,PROTEIN,UA: NEGATIVE

## 2020-09-11 NOTE — Progress Notes (Signed)
Routine Prenatal Care Visit  Subjective  Tami Foster is a 40 y.o. [phone] at [redacted]w[redacted]d being seen today for ongoing prenatal care.  She is currently monitored for the following issues for this high-risk pregnancy and has Anxiety; Hirsutism; Oligomenorrhea; PCOS (polycystic ovarian syndrome); Smoker; History of female hirsutism; ASCUS favor benign; Adjustment disorder with mixed anxiety and depressed mood; Obesity, Class I, BMI 30-34.9; Chronic fatigue; Hypertension affecting pregnancy in first trimester; Tachycardia; Multigravida of advanced maternal age in first trimester; Supervision of high risk pregnancy in first trimester; and H/O LEEP on their problem list.  ----------------------------------------------------------------------------------- Patient reports no complaints.    .  .   . Denies leaking of fluid.  ----------------------------------------------------------------------------------- The following portions of the patient's history were reviewed and updated as appropriate: allergies, current medications, past family history, past medical history, past social history, past surgical history and problem list. Problem list updated.   Objective  Blood pressure 118/74, weight 170 lb (77.1 kg), last menstrual period 03/16/2020. Pregravid weight 170 lb (77.1 kg) Total Weight Gain 0 lb (0 kg)  Body mass index is 31.09 kg/m.  Urinalysis:      Fetal Status:           General:  Alert, oriented and cooperative. Patient is in no acute distress.  Skin: Skin is warm and dry. No rash noted.   Cardiovascular: Normal heart rate noted  Respiratory: Normal respiratory effort, no problems with respiration noted  Abdomen: Soft, gravid, appropriate for gestational age.       Pelvic:  Cervical exam deferred        Extremities: Normal range of motion.     ental Status: Normal mood and affect. Normal behavior. Normal judgment and thought content.     Assessment   40 y.o. [id number] at [redacted]w[redacted]d by   12/21/2020, by Last Menstrual Period presenting for routine prenatal visit  Plan   THIRD Problems (from 05/28/20 to present)    Problem Noted Resolved   Supervision of high risk pregnancy in first trimester 05/28/2020 by Zipporah Plants, CNM No   Overview Addendum 07/29/2020  6:47 PM by Mirna Mires, CNM     Nursing Staff Provider  Office Location  [facility] Dating   LMP = 10w [place]  Language  English Anatomy US  Normal female  Flu Vaccine   declines Genetic Screen  NIPS:nml XY  TDaP vaccine    Hgb A1C or  GTT Early : ordered Third trimester :   Rhogam   n/a   LAB RESULTS   Feeding Plan  breast Blood Type B/Positive/-- (01/20 1627)   Contraception  Antibody Negative (01/20 1627)  Circumcision  Rubella 9.51 (01/20 1627)  Pediatrician   RPR Non Reactive (01/20 1627)   Support Person  Leonette Most (Buddy) - Husband HBsAg Negative (01/20 1627)   Prenatal Classes  HIV Non Reactive (01/20 1627)    Varicella  IMM  BTL Consent  n/a GBS  (For PCN allergy, check sensitivities)        VBAC Consent  n/a Pap  08/16/19 - NILM    Hgb Electro      CF      SMA         High Risk Pregnancy Diagnoses  AMA Obesity class I (pregravid BMI >30) cHTN (on metoprolol prior to pregnancy) Hx of PTL (PPROM at 34w, NSVD, hx of LEEP)      Previous Version       Gestational age appropriate obstetric precautions including but not limited  to vaginal bleeding, contractions, leaking of fluid and fetal movement were reviewed in detail with the patient.    - BG no log but fatings 70% normal per patient report  Return in about 2 weeks (around 09/25/2020) for ROB.  Vena Austria, MD, [facility], [facility] 09/11/2020, 9:15 AM  \

## 2020-09-15 ENCOUNTER — Telehealth: Payer: Self-pay

## 2020-09-15 NOTE — Telephone Encounter (Signed)
Spoke w/patient. Requested to submit glucose log thru my chart for providers review. Advised if not sufficient, she will need an appointment for f/u prior to her next scheduled apt 09/29/20

## 2020-09-16 NOTE — Telephone Encounter (Signed)
Spoke w/patient. Apt scheduled for 09/17/20 @10 :30 w/[facility] in [place]. Patient aware of office location.

## 2020-09-17 ENCOUNTER — Other Ambulatory Visit: Payer: Self-pay

## 2020-09-17 ENCOUNTER — Ambulatory Visit (INDEPENDENT_AMBULATORY_CARE_PROVIDER_SITE_OTHER): Payer: Medicaid Other | Admitting: Obstetrics and Gynecology

## 2020-09-17 VITALS — BP 133/84 | Wt 171.0 lb

## 2020-09-17 DIAGNOSIS — O0990 Supervision of high risk pregnancy, unspecified, unspecified trimester: Secondary | ICD-10-CM

## 2020-09-17 DIAGNOSIS — Z3A26 26 weeks gestation of pregnancy: Secondary | ICD-10-CM

## 2020-09-17 DIAGNOSIS — O162 Unspecified maternal hypertension, second trimester: Secondary | ICD-10-CM

## 2020-09-17 DIAGNOSIS — O09522 Supervision of elderly multigravida, second trimester: Secondary | ICD-10-CM

## 2020-09-17 MED ORDER — INSULIN PEN NEEDLE 32G X 6 MM MISC: 1.0000 [IU] | Freq: Every day | 2 refills | Status: DC

## 2020-09-17 MED ORDER — LANTUS SOLOSTAR 100 UNIT/ML ~~LOC~~ SOPN: 10.0000 [IU] | PEN_INJECTOR | Freq: Every day | SUBCUTANEOUS | 11 refills | Status: DC

## 2020-09-17 NOTE — Progress Notes (Signed)
Routine Prenatal Care Visit  Subjective  Tami Foster is a 40 y.o. [phone] at [redacted]w[redacted]d being seen today for ongoing prenatal care.  She is currently monitored for the following issues for this high-risk pregnancy and has Anxiety; Hirsutism; Oligomenorrhea; PCOS (polycystic ovarian syndrome); Smoker; History of female hirsutism; ASCUS favor benign; Adjustment disorder with mixed anxiety and depressed mood; Obesity, Class I, BMI 30-34.9; Chronic fatigue; Hypertension affecting pregnancy; Tachycardia; Multigravida of advanced maternal age; Supervision of high risk pregnancy, antepartum; and H/O LEEP on their problem list.  ----------------------------------------------------------------------------------- Patient reports no complaints.   Contractions: Not present. Vag. Bleeding: None.  Movement: Present. Denies leaking of fluid.  ----------------------------------------------------------------------------------- The following portions of the patient's history were reviewed and updated as appropriate: allergies, current medications, past family history, past medical history, past social history, past surgical history and problem list. Problem list updated.   Objective  Blood pressure 133/84, weight 171 lb (77.6 kg), last menstrual period 03/16/2020. Pregravid weight 170 lb (77.1 kg) Total Weight Gain 1 lb (0.454 kg) Urinalysis:      Fetal Status: Fetal Heart Rate (bpm): 140   Movement: Present     General:  Alert, oriented and cooperative. Patient is in no acute distress.  Skin: Skin is warm and dry. No rash noted.   Cardiovascular: Normal heart rate noted  Respiratory: Normal respiratory effort, no problems with respiration noted  Abdomen: Soft, gravid, appropriate for gestational age. Pain/Pressure: Present     Pelvic:  Cervical exam deferred        Extremities: Normal range of motion.     ental Status: Normal mood and affect. Normal behavior. Normal judgment and thought content.    Date Fasting Breakfast Lunch Dinner  5/10 102 154 80 153  5/11 94  112 146  5/12 101       Assessment   39 y.o. [id number] at [redacted]w[redacted]d by  12/21/2020, by Last Menstrual Period presenting for routine prenatal visit  Plan   THIRD Problems (from 05/28/20 to present)    Problem Noted Resolved   Supervision of high risk pregnancy, antepartum 05/28/2020 by Zipporah Plants, CNM No   Overview Addendum 07/29/2020  6:47 PM by Mirna Mires, CNM     Nursing Staff Provider  Office Location  [facility] Dating   LMP = 10w [place]  Language  English Anatomy US  Normal female  Flu Vaccine   declines Genetic Screen  NIPS:nml XY  TDaP vaccine    Hgb A1C or  GTT Early : ordered Third trimester :   Rhogam   n/a   LAB RESULTS   Feeding Plan  breast Blood Type B/Positive/-- (01/20 1627)   Contraception  Antibody Negative (01/20 1627)  Circumcision  Rubella 9.51 (01/20 1627)  Pediatrician   RPR Non Reactive (01/20 1627)   Support Person  Leonette Most (Buddy) - Husband HBsAg Negative (01/20 1627)   Prenatal Classes  HIV Non Reactive (01/20 1627)    Varicella  IMM  BTL Consent  n/a GBS  (For PCN allergy, check sensitivities)        VBAC Consent  n/a Pap  08/16/19 - NILM    Hgb Electro      CF      SMA         High Risk Pregnancy Diagnoses  AMA Obesity class I (pregravid BMI >30) cHTN (on metoprolol prior to pregnancy) Hx of PTL (PPROM at 34w, NSVD, hx of LEEP)      Previous Version  Gestational age appropriate obstetric precautions including but not limited to vaginal bleeding, contractions, leaking of fluid and fetal movement were reviewed in detail with the patient.    1) GDM - start Lantus 10U qHS - will look at continuous blood glucose meter  Return in about 1 week (around 09/24/2020) for ROB MD.  Vena Austria, MD, [facility], [facility] 09/17/2020, 11:09 AM

## 2020-09-18 MED ORDER — FREESTYLE LIBRE 2 SENSOR MISC: 1.0000 [IU] | 6 refills | Status: DC

## 2020-09-18 MED ORDER — FREESTYLE LIBRE 2 READER DEVI: 1.0000 [IU] | 0 refills | Status: DC

## 2020-09-24 ENCOUNTER — Other Ambulatory Visit: Payer: Self-pay

## 2020-09-24 ENCOUNTER — Ambulatory Visit (INDEPENDENT_AMBULATORY_CARE_PROVIDER_SITE_OTHER): Payer: Medicaid Other | Admitting: Obstetrics and Gynecology

## 2020-09-24 VITALS — BP 147/83 | HR 116 | Wt 172.0 lb

## 2020-09-24 DIAGNOSIS — O0990 Supervision of high risk pregnancy, unspecified, unspecified trimester: Secondary | ICD-10-CM

## 2020-09-24 DIAGNOSIS — Z3A27 27 weeks gestation of pregnancy: Secondary | ICD-10-CM

## 2020-09-24 DIAGNOSIS — O09522 Supervision of elderly multigravida, second trimester: Secondary | ICD-10-CM

## 2020-09-24 DIAGNOSIS — O162 Unspecified maternal hypertension, second trimester: Secondary | ICD-10-CM

## 2020-09-24 NOTE — Progress Notes (Signed)
Routine Prenatal Care Visit  Subjective  Tami Foster is a 40 y.o. [phone] at [redacted]w[redacted]d being seen today for ongoing prenatal care.  She is currently monitored for the following issues for this low-risk pregnancy and has Anxiety; Hirsutism; Oligomenorrhea; PCOS (polycystic ovarian syndrome); Smoker; History of female hirsutism; ASCUS favor benign; Adjustment disorder with mixed anxiety and depressed mood; Obesity, Class I, BMI 30-34.9; Chronic fatigue; Hypertension affecting pregnancy; Tachycardia; Multigravida of advanced maternal age; Supervision of high risk pregnancy, antepartum; and H/O LEEP on their problem list.  ----------------------------------------------------------------------------------- Patient reports no complaints.   Contractions: Not present. Vag. Bleeding: None.  Movement: Present. Denies leaking of fluid.  ----------------------------------------------------------------------------------- The following portions of the patient's history were reviewed and updated as appropriate: allergies, current medications, past family history, past medical history, past social history, past surgical history and problem list. Problem list updated.   Objective  Blood pressure (!) 147/83, pulse (!) 116, weight 172 lb (78 kg), last menstrual period 03/16/2020. Pregravid weight 170 lb (77.1 kg) Total Weight Gain 2 lb (0.907 kg) Urinalysis:      Fetal Status: Fetal Heart Rate (bpm): 150 Fundal Height: 27 cm Movement: Present     General:  Alert, oriented and cooperative. Patient is in no acute distress.  Skin: Skin is warm and dry. No rash noted.   Cardiovascular: Normal heart rate noted  Respiratory: Normal respiratory effort, no problems with respiration noted  Abdomen: Soft, gravid, appropriate for gestational age. Pain/Pressure: Present     Pelvic:  Cervical exam deferred        Extremities: Normal range of motion.     ental Status: Normal mood and affect. Normal behavior. Normal  judgment and thought content.   Date Fasting  Breakfast Lunch  Dinner  5/12 101  129 91   96 106 102 95   97  99 101   86 97 114 94   93 125 149    86 101 124    109 120       Assessment   39 y.o. [id number] at [redacted]w[redacted]d by  12/21/2020, by Last Menstrual Period presenting for routine prenatal visit  Plan   THIRD Problems (from 05/28/20 to present)    Problem Noted Resolved   Supervision of high risk pregnancy, antepartum 05/28/2020 by Zipporah Plants, CNM No   Overview Addendum 07/29/2020  6:47 PM by Mirna Mires, CNM     Nursing Staff Provider  Office Location  [facility] Dating   LMP = 10w [place]  Language  English Anatomy US  Normal female  Flu Vaccine   declines Genetic Screen  NIPS:nml XY  TDaP vaccine    Hgb A1C or  GTT Early : ordered Third trimester :   Rhogam   n/a   LAB RESULTS   Feeding Plan  breast Blood Type B/Positive/-- (01/20 1627)   Contraception  Antibody Negative (01/20 1627)  Circumcision  Rubella 9.51 (01/20 1627)  Pediatrician   RPR Non Reactive (01/20 1627)   Support Person  Leonette Most (Buddy) - Husband HBsAg Negative (01/20 1627)   Prenatal Classes  HIV Non Reactive (01/20 1627)    Varicella  IMM  BTL Consent  n/a GBS  (For PCN allergy, check sensitivities)        VBAC Consent  n/a Pap  08/16/19 - NILM    Hgb Electro      CF      SMA         High Risk Pregnancy Diagnoses  AMA  Obesity class I (pregravid BMI >30) cHTN (on metoprolol prior to pregnancy) Hx of PTL (PPROM at 34w, NSVD, hx of LEEP)      Previous Version       Gestational age appropriate obstetric precautions including but not limited to vaginal bleeding, contractions, leaking of fluid and fetal movement were reviewed in detail with the patient.    1) Chronic hypertension - mild range BP today, asymptomatic  2) GDM - well controlled continue Lantus 10 U daily  Return in about 1 week (around 10/01/2020) for ROB MD.  Vena Austria, MD, [facility], [facility] 09/24/2020, 10:57 AM

## 2020-09-29 ENCOUNTER — Other Ambulatory Visit: Payer: Self-pay

## 2020-09-29 ENCOUNTER — Ambulatory Visit (INDEPENDENT_AMBULATORY_CARE_PROVIDER_SITE_OTHER): Payer: Medicaid Other | Admitting: Obstetrics and Gynecology

## 2020-09-29 VITALS — BP 132/78 | Wt 173.0 lb

## 2020-09-29 DIAGNOSIS — O09523 Supervision of elderly multigravida, third trimester: Secondary | ICD-10-CM

## 2020-09-29 DIAGNOSIS — O162 Unspecified maternal hypertension, second trimester: Secondary | ICD-10-CM

## 2020-09-29 DIAGNOSIS — O0990 Supervision of high risk pregnancy, unspecified, unspecified trimester: Secondary | ICD-10-CM

## 2020-09-29 DIAGNOSIS — Z3A28 28 weeks gestation of pregnancy: Secondary | ICD-10-CM

## 2020-09-29 NOTE — Progress Notes (Signed)
Routine Prenatal Care Visit  Subjective  Tami Foster is a 40 y.o. [phone] at [redacted]w[redacted]d being seen today for ongoing prenatal care.  She is currently monitored for the following issues for this high-risk pregnancy and has Anxiety; Hirsutism; Oligomenorrhea; PCOS (polycystic ovarian syndrome); Smoker; History of female hirsutism; ASCUS favor benign; Adjustment disorder with mixed anxiety and depressed mood; Obesity, Class I, BMI 30-34.9; Chronic fatigue; Hypertension affecting pregnancy; Tachycardia; Multigravida of advanced maternal age; Supervision of high risk pregnancy, antepartum; and H/O LEEP on their problem list.  ----------------------------------------------------------------------------------- Patient reports no complaints.   Contractions: Not present. Vag. Bleeding: None.  Movement: Present. Denies leaking of fluid.  ----------------------------------------------------------------------------------- The following portions of the patient's history were reviewed and updated as appropriate: allergies, current medications, past family history, past medical history, past social history, past surgical history and problem list. Problem list updated.   Objective  Blood pressure 132/78, weight 173 lb (78.5 kg), last menstrual period 03/16/2020. Pregravid weight 170 lb (77.1 kg) Total Weight Gain 3 lb (1.361 kg) Urinalysis:      Fetal Status: Fetal Heart Rate (bpm): 145 Fundal Height: 28 cm Movement: Present  Presentation: Vertex  General:  Alert, oriented and cooperative. Patient is in no acute distress.  Skin: Skin is warm and dry. No rash noted.   Cardiovascular: Normal heart rate noted  Respiratory: Normal respiratory effort, no problems with respiration noted  Abdomen: Soft, gravid, appropriate for gestational age. Pain/Pressure: Present     Pelvic:  Cervical exam deferred        Extremities: Normal range of motion.     ental Status: Normal mood and affect. Normal behavior. Normal  judgment and thought content.     Assessment   40 y.o. [id number] at [redacted]w[redacted]d by  12/21/2020, by Last Menstrual Period presenting for routine prenatal visit  Plan   THIRD Problems (from 05/28/20 to present)    Problem Noted Resolved   Supervision of high risk pregnancy, antepartum 05/28/2020 by Zipporah Plants, CNM No   Overview Addendum 07/29/2020  6:47 PM by Mirna Mires, CNM     Nursing Staff Provider  Office Location  [facility] Dating   LMP = 10w [place]  Language  English Anatomy US  Normal female  Flu Vaccine   declines Genetic Screen  NIPS:nml XY  TDaP vaccine    Hgb A1C or  GTT Early : ordered Third trimester :   Rhogam   n/a   LAB RESULTS   Feeding Plan  breast Blood Type B/Positive/-- (01/20 1627)   Contraception  Antibody Negative (01/20 1627)  Circumcision  Rubella 9.51 (01/20 1627)  Pediatrician   RPR Non Reactive (01/20 1627)   Support Person  Leonette Most (Buddy) - Husband HBsAg Negative (01/20 1627)   Prenatal Classes  HIV Non Reactive (01/20 1627)    Varicella  IMM  BTL Consent  n/a GBS  (For PCN allergy, check sensitivities)        VBAC Consent  n/a Pap  08/16/19 - NILM    Hgb Electro      CF      SMA         High Risk Pregnancy Diagnoses  AMA Obesity class I (pregravid BMI >30) cHTN (on metoprolol prior to pregnancy) Hx of PTL (PPROM at 34w, NSVD, hx of LEEP)      Previous Version       Gestational age appropriate obstetric precautions including but not limited to vaginal bleeding, contractions, leaking of fluid and fetal movement were  reviewed in detail with the patient.    1) GDM - long report BG no changes since last week - no adjustmets to insulin at this time (Lantus 10 at bedtime) - tried to get continuous blood glucose sensor but needs to be on insulin at least twice daily.  Suspect will need to add mealtime coverage eventually but need logs to make that decision - MFM [place] coming up 10/07/20  2) CHTN - remains well controlled  Return in about 15 days  (around 10/14/2020) for cancel 6/2 visit and make visit 6/8-6/9 ROB MD.  Vena Austria, MD, [facility], [facility] 09/29/2020, 10:47 AM

## 2020-09-30 ENCOUNTER — Other Ambulatory Visit: Payer: Self-pay | Admitting: Obstetrics & Gynecology

## 2020-10-01 ENCOUNTER — Other Ambulatory Visit: Payer: Self-pay | Admitting: Obstetrics & Gynecology

## 2020-10-07 ENCOUNTER — Other Ambulatory Visit: Payer: Self-pay

## 2020-10-07 ENCOUNTER — Ambulatory Visit: Payer: Medicaid Other | Admitting: *Deleted

## 2020-10-07 ENCOUNTER — Telehealth: Payer: Self-pay

## 2020-10-07 ENCOUNTER — Encounter: Payer: Self-pay | Admitting: *Deleted

## 2020-10-07 ENCOUNTER — Ambulatory Visit: Payer: Medicaid Other | Attending: Obstetrics

## 2020-10-07 DIAGNOSIS — O0991 Supervision of high risk pregnancy, unspecified, first trimester: Secondary | ICD-10-CM | POA: Diagnosis not present

## 2020-10-07 DIAGNOSIS — O0990 Supervision of high risk pregnancy, unspecified, unspecified trimester: Secondary | ICD-10-CM | POA: Insufficient documentation

## 2020-10-07 DIAGNOSIS — O24419 Gestational diabetes mellitus in pregnancy, unspecified control: Secondary | ICD-10-CM

## 2020-10-07 NOTE — Telephone Encounter (Signed)
Was able to reach out to pt via phone and make contact to review their recent ZIO monitor results. Dr. Mariah Milling advised based on the current results   "Event monitor  No significant arrhythmia noted  no patient triggered events reported  Overall good study"  Pt verbalized understanding, is thankful for the results call, all questions and concerns were address. Will call back for anything further, f/u as schedule.

## 2020-10-08 ENCOUNTER — Other Ambulatory Visit: Payer: Self-pay | Admitting: *Deleted

## 2020-10-08 ENCOUNTER — Encounter: Payer: Medicaid Other | Admitting: Obstetrics and Gynecology

## 2020-10-08 DIAGNOSIS — O24414 Gestational diabetes mellitus in pregnancy, insulin controlled: Secondary | ICD-10-CM

## 2020-10-09 ENCOUNTER — Other Ambulatory Visit: Payer: Self-pay | Admitting: *Deleted

## 2020-10-09 DIAGNOSIS — O24414 Gestational diabetes mellitus in pregnancy, insulin controlled: Secondary | ICD-10-CM

## 2020-10-16 ENCOUNTER — Other Ambulatory Visit: Payer: Self-pay

## 2020-10-16 ENCOUNTER — Ambulatory Visit (INDEPENDENT_AMBULATORY_CARE_PROVIDER_SITE_OTHER): Payer: Medicaid Other | Admitting: Obstetrics and Gynecology

## 2020-10-16 VITALS — BP 140/82 | Wt 173.0 lb

## 2020-10-16 DIAGNOSIS — O24414 Gestational diabetes mellitus in pregnancy, insulin controlled: Secondary | ICD-10-CM | POA: Insufficient documentation

## 2020-10-16 DIAGNOSIS — Z369 Encounter for antenatal screening, unspecified: Secondary | ICD-10-CM

## 2020-10-16 DIAGNOSIS — Z23 Encounter for immunization: Secondary | ICD-10-CM

## 2020-10-16 DIAGNOSIS — O0990 Supervision of high risk pregnancy, unspecified, unspecified trimester: Secondary | ICD-10-CM

## 2020-10-16 DIAGNOSIS — O09523 Supervision of elderly multigravida, third trimester: Secondary | ICD-10-CM

## 2020-10-16 DIAGNOSIS — O162 Unspecified maternal hypertension, second trimester: Secondary | ICD-10-CM | POA: Diagnosis not present

## 2020-10-16 NOTE — Progress Notes (Signed)
Routine Prenatal Care Visit  Subjective  Tami Foster is a 40 y.o. [phone] at [redacted]w[redacted]d being seen today for ongoing prenatal care.  She is currently monitored for the following issues for this high-risk pregnancy and has Anxiety; Hirsutism; Oligomenorrhea; PCOS (polycystic ovarian syndrome); Smoker; History of female hirsutism; ASCUS favor benign; Adjustment disorder with mixed anxiety and depressed mood; Obesity, Class I, BMI 30-34.9; Chronic fatigue; Hypertension affecting pregnancy; Tachycardia; Multigravida of advanced maternal age; Supervision of high risk pregnancy, antepartum; and H/O LEEP on their problem list.  ----------------------------------------------------------------------------------- Patient reports  some sciatica .   Contractions: Not present. Vag. Bleeding: None.  Movement: Present. Denies leaking of fluid.  ----------------------------------------------------------------------------------- The following portions of the patient's history were reviewed and updated as appropriate: allergies, current medications, past family history, past medical history, past social history, past surgical history and problem list. Problem list updated.   Objective  Blood pressure 140/82, weight 173 lb (78.5 kg), last menstrual period 03/16/2020. Pregravid weight 170 lb (77.1 kg) Total Weight Gain 3 lb (1.361 kg) Urinalysis:      Fetal Status: Fetal Heart Rate (bpm): 140 Fundal Height: 30 cm Movement: Present  Presentation: Vertex  General:  Alert, oriented and cooperative. Patient is in no acute distress.  Skin: Skin is warm and dry. No rash noted.   Cardiovascular: Normal heart rate noted  Respiratory: Normal respiratory effort, no problems with respiration noted  Abdomen: Soft, gravid, appropriate for gestational age. Pain/Pressure: Absent     Pelvic:  Cervical exam deferred        Extremities: Normal range of motion.     ental Status: Normal mood and affect. Normal behavior.  Normal judgment and thought content.   Immunization History  Administered Date(s) Administered   Tdap 07/20/2016     Assessment   39 y.o. [id number] at [redacted]w[redacted]d by  12/21/2020, by Last Menstrual Period presenting for routine prenatal visit  Plan   THIRD Problems (from 05/28/20 to present)     Problem Noted Resolved   Supervision of high risk pregnancy, antepartum 05/28/2020 by Zipporah Plants, CNM No   Overview Addendum 07/29/2020  6:47 PM by Mirna Mires, CNM     Nursing Staff Provider  Office Location  [facility] Dating   LMP = 10w [place]  Language  English Anatomy US  Normal female  Flu Vaccine   declines Genetic Screen  NIPS:nml XY  TDaP vaccine    Hgb A1C or  GTT Early : ordered Third trimester :   Rhogam   n/a   LAB RESULTS   Feeding Plan  breast Blood Type B/Positive/-- (01/20 1627)   Contraception  Antibody Negative (01/20 1627)  Circumcision  Rubella 9.51 (01/20 1627)  Pediatrician   RPR Non Reactive (01/20 1627)   Support Person  Leonette Most (Buddy) - Husband HBsAg Negative (01/20 1627)   Prenatal Classes  HIV Non Reactive (01/20 1627)    Varicella  IMM  BTL Consent  n/a GBS  (For PCN allergy, check sensitivities)        VBAC Consent  n/a Pap  08/16/19 - NILM    Hgb Electro      CF      SMA        High Risk Pregnancy Diagnoses  AMA Obesity class I (pregravid BMI >30) cHTN (on metoprolol prior to pregnancy) Hx of PTL (PPROM at 34w, NSVD, hx of LEEP)            Gestational age appropriate obstetric precautions including but not  limited to vaginal bleeding, contractions, leaking of fluid and fetal movement were reviewed in detail with the patient.    - TDAP today - BG remain at or near goal no insulin adjustments - BP normotensive, asymptomatic - CBC, CMP, RPR, and HIV today  Return in about 2 weeks (around 10/30/2020) for ROB and NST.  Vena Austria, MD, [facility], [facility] 10/16/2020, 11:10 AM

## 2020-10-16 NOTE — Addendum Note (Signed)
Addended by: Fortunato Curling R on: 10/16/2020 11:50 AM   Modules accepted: Orders

## 2020-10-16 NOTE — Progress Notes (Signed)
ROB - pelvic pressure. RM 4

## 2020-10-17 LAB — CBC
Hematocrit: 36.8 % (ref 34.0–46.6)
Hemoglobin: 11.8 g/dL (ref 11.1–15.9)
MCH: 29.9 pg (ref 26.6–33.0)
MCHC: 32.1 g/dL (ref 31.5–35.7)
MCV: 93 fL (ref 79–97)
Platelets: 269 10*3/uL (ref 150–450)
RBC: 3.95 x10E6/uL (ref 3.77–5.28)
RDW: 12.6 % (ref 11.7–15.4)
WBC: 13.6 10*3/uL — ABNORMAL HIGH (ref 3.4–10.8)

## 2020-10-17 LAB — COMPREHENSIVE METABOLIC PANEL
ALT: 18 IU/L (ref 0–32)
AST: 15 IU/L (ref 0–40)
Albumin/Globulin Ratio: 1.4 (ref 1.2–2.2)
Albumin: 3.6 g/dL — ABNORMAL LOW (ref 3.8–4.8)
Alkaline Phosphatase: 113 IU/L (ref 44–121)
BUN/Creatinine Ratio: 12 (ref 9–23)
BUN: 5 mg/dL — ABNORMAL LOW (ref 6–20)
Bilirubin Total: 0.2 mg/dL (ref 0.0–1.2)
CO2: 17 mmol/L — ABNORMAL LOW (ref 20–29)
Calcium: 8.9 mg/dL (ref 8.7–10.2)
Chloride: 105 mmol/L (ref 96–106)
Creatinine, Ser: 0.41 mg/dL — ABNORMAL LOW (ref 0.57–1.00)
Globulin, Total: 2.6 g/dL (ref 1.5–4.5)
Glucose: 78 mg/dL (ref 65–99)
Potassium: 3.9 mmol/L (ref 3.5–5.2)
Sodium: 137 mmol/L (ref 134–144)
Total Protein: 6.2 g/dL (ref 6.0–8.5)
eGFR: 130 mL/min/{1.73_m2} (ref >59)

## 2020-10-17 LAB — RPR: RPR Ser Ql: NONREACTIVE

## 2020-10-17 LAB — HIV ANTIBODY (ROUTINE TESTING W REFLEX): HIV Screen 4th Generation wRfx: NONREACTIVE

## 2020-10-28 ENCOUNTER — Encounter: Payer: Self-pay | Admitting: *Deleted

## 2020-10-28 ENCOUNTER — Other Ambulatory Visit: Payer: Self-pay

## 2020-10-28 ENCOUNTER — Ambulatory Visit: Payer: Medicaid Other | Admitting: *Deleted

## 2020-10-28 ENCOUNTER — Ambulatory Visit: Payer: Medicaid Other | Attending: Obstetrics and Gynecology

## 2020-10-28 VITALS — BP 135/83 | HR 108

## 2020-10-28 DIAGNOSIS — O24414 Gestational diabetes mellitus in pregnancy, insulin controlled: Secondary | ICD-10-CM | POA: Diagnosis not present

## 2020-10-28 DIAGNOSIS — O0990 Supervision of high risk pregnancy, unspecified, unspecified trimester: Secondary | ICD-10-CM | POA: Insufficient documentation

## 2020-10-30 ENCOUNTER — Other Ambulatory Visit: Payer: Self-pay | Admitting: Obstetrics & Gynecology

## 2020-11-02 ENCOUNTER — Encounter: Payer: Medicaid Other | Admitting: Obstetrics and Gynecology

## 2020-11-04 ENCOUNTER — Ambulatory Visit: Payer: Medicaid Other | Attending: Obstetrics and Gynecology

## 2020-11-04 ENCOUNTER — Encounter: Payer: Self-pay | Admitting: *Deleted

## 2020-11-04 ENCOUNTER — Ambulatory Visit: Payer: Medicaid Other | Admitting: *Deleted

## 2020-11-04 ENCOUNTER — Other Ambulatory Visit: Payer: Self-pay

## 2020-11-04 VITALS — BP 126/82 | HR 105

## 2020-11-04 DIAGNOSIS — O24419 Gestational diabetes mellitus in pregnancy, unspecified control: Secondary | ICD-10-CM | POA: Diagnosis not present

## 2020-11-04 DIAGNOSIS — O0991 Supervision of high risk pregnancy, unspecified, first trimester: Secondary | ICD-10-CM | POA: Diagnosis not present

## 2020-11-04 DIAGNOSIS — O24414 Gestational diabetes mellitus in pregnancy, insulin controlled: Secondary | ICD-10-CM | POA: Diagnosis not present

## 2020-11-04 DIAGNOSIS — O0990 Supervision of high risk pregnancy, unspecified, unspecified trimester: Secondary | ICD-10-CM

## 2020-11-06 ENCOUNTER — Ambulatory Visit (INDEPENDENT_AMBULATORY_CARE_PROVIDER_SITE_OTHER): Payer: Medicaid Other | Admitting: Obstetrics

## 2020-11-06 ENCOUNTER — Other Ambulatory Visit: Payer: Self-pay

## 2020-11-06 VITALS — BP 120/80 | Wt 177.0 lb

## 2020-11-06 DIAGNOSIS — O99613 Diseases of the digestive system complicating pregnancy, third trimester: Secondary | ICD-10-CM

## 2020-11-06 DIAGNOSIS — K5900 Constipation, unspecified: Secondary | ICD-10-CM

## 2020-11-06 DIAGNOSIS — O0990 Supervision of high risk pregnancy, unspecified, unspecified trimester: Secondary | ICD-10-CM

## 2020-11-06 LAB — POCT URINALYSIS DIPSTICK OB
Glucose, UA: NEGATIVE
POC,PROTEIN,UA: NEGATIVE

## 2020-11-06 MED ORDER — HYDROCORT-PRAMOXINE (PERIANAL) 1-1 % EX FOAM
1.0000 | Freq: Two times a day (BID) | CUTANEOUS | Status: AC
Start: 2020-11-06 — End: 2020-11-09

## 2020-11-06 NOTE — Progress Notes (Signed)
Routine Prenatal Care Visit  Subjective  Tami Foster is a 40 y.o. [phone] at [redacted]w[redacted]d being seen today for ongoing prenatal care.  She is currently monitored for the following issues for this high-risk pregnancy and has Anxiety; Hirsutism; Oligomenorrhea; PCOS (polycystic ovarian syndrome); Smoker; History of female hirsutism; ASCUS favor benign; Adjustment disorder with mixed anxiety and depressed mood; Obesity, Class I, BMI 30-34.9; Chronic fatigue; Hypertension affecting pregnancy; Tachycardia; Multigravida of advanced maternal age; Supervision of high risk pregnancy, antepartum; H/O LEEP; and Insulin controlled gestational diabetes mellitus (GDM) during pregnancy, antepartum on their problem list.  ----------------------------------------------------------------------------------- Patient reports really struggling wtih constipation and has hemorroids. she has not had a BM in a week. Very uncomfortable.Her glucose chart is at home- sent by her hubby via phone. Most levels are in range, with occasional higher values.   Contractions: Irregular. Vag. Bleeding: None.  Movement: Present. Leaking Fluid denies.  ----------------------------------------------------------------------------------- The following portions of the patient's history were reviewed and updated as appropriate: allergies, current medications, past family history, past medical history, past social history, past surgical history and problem list. Problem list updated.  Objective  Blood pressure 120/80, weight 177 lb (80.3 kg), last menstrual period 03/16/2020. Pregravid weight 170 lb (77.1 kg) Total Weight Gain 7 lb (3.175 kg) Urinalysis: Urine Protein Negative  Urine Glucose Negative  Fetal Status:     Movement: Present     General:  Alert, oriented and cooperative. Patient is in no acute distress.  Skin: Skin is warm and dry. No rash noted.   Cardiovascular: Normal heart rate noted  Respiratory: Normal respiratory effort, no  problems with respiration noted  Abdomen: Soft, gravid, appropriate for gestational age. Pain/Pressure: Present     Pelvic:  Cervical exam deferred        Extremities: Normal range of motion.     Mental Status: Normal mood and affect. Normal behavior. Normal judgment and thought content.   Assessment   40 y.o. [id number] at [redacted]w[redacted]d by  12/21/2020, by Last Menstrual Period presenting for routine prenatal visit  Plan   THIRD Problems (from 05/28/20 to present)    Problem Noted Resolved   Insulin controlled gestational diabetes mellitus (GDM) during pregnancy, antepartum 10/16/2020 by Vena Austria, MD No   Overview Signed 10/16/2020 11:14 AM by Vena Austria, MD    10U qHS       Supervision of high risk pregnancy, antepartum 05/28/2020 by Zipporah Plants, CNM No   Overview Addendum 10/16/2020 11:13 AM by Vena Austria, MD     Nursing Staff Provider  Office Location  [facility] Dating   LMP = 10w [place]  Language  English Anatomy US  Normal female  Flu Vaccine   declines Genetic Screen  NIPS:nml XY  TDaP vaccine   10/16/2020 Hgb A1C or  GTT Early : 189  3-hr : 100 / 202 / 170 / 102  Rhogam   n/a   LAB RESULTS   Feeding Plan  breast Blood Type B/Positive/-- (01/20 1627)   Contraception  Antibody Negative (01/20 1627)  Circumcision  Rubella 9.51 (01/20 1627)  Pediatrician   RPR Non Reactive (01/20 1627)   Support Person  Leonette Most (Buddy) - Husband HBsAg Negative (01/20 1627)   Prenatal Classes  HIV Non Reactive (01/20 1627)    Varicella  IMM  BTL Consent  n/a GBS  (For PCN allergy, check sensitivities)        VBAC Consent  n/a Pap  08/16/19 - NILM    Hgb Electro  CF      SMA         High Risk Pregnancy Diagnoses  AMA Obesity class I (pregravid BMI >30) cHTN (on metoprolol prior to pregnancy) Hx of PTL (PPROM at 34w, NSVD, hx of LEEP)           Preterm labor symptoms and general obstetric precautions including but not limited to vaginal bleeding, contractions, leaking of  fluid and fetal movement were reviewed in detail with the patient. Please refer to After Visit Summary for other counseling recommendations.  NST is reactive today. Will Prescribe her some Procto foam. Suggested she try daily Colace, stop vitamins for now and cut back on her Fioricet. Return in about 1 week (around 11/13/2020) for return OB with an MD only please ( high Risk  and diabetic on insulin), NST.  Mirna Mires, CNM  11/06/2020 4:48 PM

## 2020-11-11 ENCOUNTER — Ambulatory Visit: Payer: Medicaid Other | Admitting: *Deleted

## 2020-11-11 ENCOUNTER — Ambulatory Visit: Payer: Medicaid Other | Attending: Obstetrics and Gynecology

## 2020-11-11 ENCOUNTER — Encounter: Payer: Self-pay | Admitting: *Deleted

## 2020-11-11 ENCOUNTER — Other Ambulatory Visit: Payer: Self-pay

## 2020-11-11 VITALS — BP 112/68 | HR 113

## 2020-11-11 DIAGNOSIS — O24414 Gestational diabetes mellitus in pregnancy, insulin controlled: Secondary | ICD-10-CM | POA: Diagnosis not present

## 2020-11-11 DIAGNOSIS — O0990 Supervision of high risk pregnancy, unspecified, unspecified trimester: Secondary | ICD-10-CM | POA: Diagnosis present

## 2020-11-13 ENCOUNTER — Ambulatory Visit (INDEPENDENT_AMBULATORY_CARE_PROVIDER_SITE_OTHER): Payer: Medicaid Other | Admitting: Obstetrics & Gynecology

## 2020-11-13 ENCOUNTER — Encounter: Payer: Self-pay | Admitting: Obstetrics & Gynecology

## 2020-11-13 ENCOUNTER — Other Ambulatory Visit: Payer: Self-pay

## 2020-11-13 VITALS — BP 120/70 | Wt 171.0 lb

## 2020-11-13 DIAGNOSIS — E669 Obesity, unspecified: Secondary | ICD-10-CM

## 2020-11-13 DIAGNOSIS — Z8751 Personal history of pre-term labor: Secondary | ICD-10-CM | POA: Insufficient documentation

## 2020-11-13 DIAGNOSIS — O09523 Supervision of elderly multigravida, third trimester: Secondary | ICD-10-CM

## 2020-11-13 DIAGNOSIS — O24414 Gestational diabetes mellitus in pregnancy, insulin controlled: Secondary | ICD-10-CM | POA: Diagnosis not present

## 2020-11-13 DIAGNOSIS — O162 Unspecified maternal hypertension, second trimester: Secondary | ICD-10-CM

## 2020-11-13 DIAGNOSIS — Z9889 Other specified postprocedural states: Secondary | ICD-10-CM

## 2020-11-13 DIAGNOSIS — O0993 Supervision of high risk pregnancy, unspecified, third trimester: Secondary | ICD-10-CM

## 2020-11-13 DIAGNOSIS — Z3A34 34 weeks gestation of pregnancy: Secondary | ICD-10-CM

## 2020-11-13 MED ORDER — PROCTOFOAM HC 1-1 % EX FOAM: 1.0000 | Freq: Two times a day (BID) | CUTANEOUS | 1 refills | Status: DC

## 2020-11-13 MED ORDER — LANTUS SOLOSTAR 100 UNIT/ML ~~LOC~~ SOPN: 10.0000 [IU] | PEN_INJECTOR | Freq: Two times a day (BID) | SUBCUTANEOUS | 11 refills | Status: DC

## 2020-11-13 MED ORDER — ONDANSETRON 4 MG PO TBDP: 4.0000 mg | ORAL_TABLET | Freq: Four times a day (QID) | ORAL | 0 refills | Status: DC | PRN

## 2020-11-13 NOTE — Progress Notes (Signed)
Prenatal Visit Note Date: 11/13/2020 Clinic: [facility]  Subjective:  Tami Foster is a 40 y.o. [id number] at [redacted]w[redacted]d being seen today for ongoing prenatal care.  She is currently monitored for the following issues for this high-risk pregnancy and has Anxiety; Hirsutism; Oligomenorrhea; PCOS (polycystic ovarian syndrome); Smoker; History of female hirsutism; ASCUS favor benign; Adjustment disorder with mixed anxiety and depressed mood; Obesity, Class I, BMI 30-34.9; Chronic fatigue; Hypertension affecting pregnancy; Tachycardia; Multigravida of advanced maternal age; Supervision of high risk pregnancy, antepartum; H/O LEEP; Insulin controlled gestational diabetes mellitus (GDM) during pregnancy, antepartum; and History of preterm delivery on their problem list.  Patient reports backache, fatigue, nausea, no bleeding, no contractions, no cramping, and no leaking.      FBS <95, 2 hr PPBS 110-130    Insulin 10 U nightly No s/sx PTL (prior PPROM 34 weeks) Hemorrhoid discomfort  The following portions of the patient's history were reviewed and updated as appropriate: allergies, current medications, past family history, past medical history, past social history, past surgical history and problem list. Problem list updated.  Objective:   Vitals:   11/13/20 1413  BP: 120/70  Weight: 171 lb (77.6 kg)    Fetal Status: Fetal Heart Rate (bpm): 140   Movement: Present     General:  Alert, oriented and cooperative. Patient is in no acute distress.  Skin: Skin is warm and dry. No rash noted.   Cardiovascular: Normal heart rate noted  Respiratory: Normal respiratory effort, no problems with respiration noted  Abdomen: Soft, gravid, appropriate for gestational age. Pain/Pressure: Present     Pelvic:  Cervical exam deferred        Extremities: Normal range of motion.  Edema: None  Mental Status: Normal mood and affect. Normal behavior. Normal judgment and thought content.   Urinalysis:      A NST  procedure was performed with FHR monitoring and a normal baseline established, appropriate time of 20-40 minutes of evaluation, and accels >2 seen w 15x15 characteristics.  Results show a REACTIVE NST.   Review of ULTRASOUND.    I have personally reviewed images and report of recent ultrasound done at [facility].    Plan of management to be discussed with patient. BPP 8/8 w Polyhydramnios  Assessment and Plan:  Pregnancy: [id number] at [redacted]w[redacted]d  1. Insulin controlled gestational diabetes mellitus (GDM) in third trimester Increase Insulin to 10 U BID Cont to keep BS log Risks of polyhydramnios discussed APT weekly    ([place] at MFM)  2. Supervision of high risk pregnancy in third trimester  3. Hypertension affecting pregnancy in second trimester No meds needed Monitor for preeclampsia now and into postpartum timeframe  4. Multigravida of advanced maternal age in third trimester Discussed labor and risks of AMA  5. H/O LEEP Discussed rrisks of scarring and labor effects from LEEP  6. Obesity, Class I, BMI 30-34.9  7. History of preterm delivery  8. [redacted] weeks gestation of pregnancy PNV, [facility] Rx Zofran, Proctofoam  9. Insulin controlled gestational diabetes mellitus (GDM) during pregnancy, antepartum  Preterm labor symptoms and general obstetric precautions including but not limited to vaginal bleeding, contractions, leaking of fluid and fetal movement were reviewed in detail with the patient. Please refer to After Visit Summary for other counseling recommendations.   THIRD Problems (from 05/28/20 to present)     Problem Noted Resolved   Insulin controlled gestational diabetes mellitus (GDM) during pregnancy, antepartum 10/16/2020 by Vena Austria, MD No   Overview Signed 10/16/2020 11:14 AM by  Vena Austria, MD    10U BID       Supervision of high risk pregnancy, antepartum 05/28/2020 by Zipporah Plants, CNM No   Overview Addendum 10/16/2020 11:13 AM by Vena Austria, MD      Nursing Staff Provider  Office Location  [facility] Dating   LMP = 10w [place]  Language  English Anatomy US  Normal female  Flu Vaccine   declines Genetic Screen  NIPS:nml XY  TDaP vaccine   10/16/2020 Hgb A1C or  GTT Early : 189  3-hr : 100 / 202 / 170 / 102  Rhogam   n/a   LAB RESULTS   Feeding Plan  breast Blood Type B/Positive/-- (01/20 1627)   Contraception Nuvaring, considering permanent (vasec or BTL) Antibody Negative (01/20 1627)  Circumcision  Rubella 9.51 (01/20 1627)  Pediatrician   RPR Non Reactive (01/20 1627)   Support Person  Leonette Most (Buddy) - Husband HBsAg Negative (01/20 1627)   Prenatal Classes  HIV Non Reactive (01/20 1627)    Varicella  IMM  BTL Consent  n/a GBS  (For PCN allergy, check sensitivities)        VBAC Consent  n/a Pap  08/16/19 - NILM    Hgb Electro      CF      SMA        High Risk Pregnancy Diagnoses  AMA Obesity class I (pregravid BMI >30) cHTN (on metoprolol prior to pregnancy) Hx of PTL (PPROM at 34w, NSVD, hx of LEEP)            Return in about 1 week (around 11/20/2020) for HROB w NST, also in 2 weeks (HROB, NST)  (all MD).  Annamarie Major, MD, [facility], [facility] 11/13/2020  2:59 PM

## 2020-11-18 ENCOUNTER — Ambulatory Visit: Payer: Medicaid Other | Admitting: *Deleted

## 2020-11-18 ENCOUNTER — Ambulatory Visit: Payer: Medicaid Other | Attending: Obstetrics and Gynecology

## 2020-11-18 ENCOUNTER — Other Ambulatory Visit: Payer: Self-pay

## 2020-11-18 VITALS — BP 126/61 | HR 90

## 2020-11-18 DIAGNOSIS — O0990 Supervision of high risk pregnancy, unspecified, unspecified trimester: Secondary | ICD-10-CM

## 2020-11-18 DIAGNOSIS — O24414 Gestational diabetes mellitus in pregnancy, insulin controlled: Secondary | ICD-10-CM | POA: Insufficient documentation

## 2020-11-20 ENCOUNTER — Encounter: Payer: Self-pay | Admitting: Obstetrics and Gynecology

## 2020-11-20 ENCOUNTER — Ambulatory Visit (INDEPENDENT_AMBULATORY_CARE_PROVIDER_SITE_OTHER): Payer: Medicaid Other | Admitting: Obstetrics and Gynecology

## 2020-11-20 ENCOUNTER — Encounter: Payer: Medicaid Other | Admitting: Obstetrics and Gynecology

## 2020-11-20 ENCOUNTER — Other Ambulatory Visit: Payer: Self-pay

## 2020-11-20 VITALS — BP 120/80 | Wt 171.0 lb

## 2020-11-20 DIAGNOSIS — O0993 Supervision of high risk pregnancy, unspecified, third trimester: Secondary | ICD-10-CM | POA: Diagnosis not present

## 2020-11-20 DIAGNOSIS — Z9889 Other specified postprocedural states: Secondary | ICD-10-CM

## 2020-11-20 DIAGNOSIS — Z3A35 35 weeks gestation of pregnancy: Secondary | ICD-10-CM

## 2020-11-20 DIAGNOSIS — O163 Unspecified maternal hypertension, third trimester: Secondary | ICD-10-CM

## 2020-11-20 DIAGNOSIS — O24414 Gestational diabetes mellitus in pregnancy, insulin controlled: Secondary | ICD-10-CM

## 2020-11-20 DIAGNOSIS — O09523 Supervision of elderly multigravida, third trimester: Secondary | ICD-10-CM | POA: Diagnosis not present

## 2020-11-20 DIAGNOSIS — Z8751 Personal history of pre-term labor: Secondary | ICD-10-CM

## 2020-11-20 NOTE — Progress Notes (Signed)
Routine Prenatal Care Visit  Subjective  Tami Foster is a 40 y.o. [phone] at [redacted]w[redacted]d being seen today for ongoing prenatal care.  She is currently monitored for the following issues for this high-risk pregnancy and has Anxiety; Hirsutism; Oligomenorrhea; PCOS (polycystic ovarian syndrome); Smoker; History of female hirsutism; ASCUS favor benign; Adjustment disorder with mixed anxiety and depressed mood; Obesity, Class I, BMI 30-34.9; Chronic fatigue; Hypertension affecting pregnancy; Tachycardia; Multigravida of advanced maternal age; Supervision of high risk pregnancy, antepartum; H/O LEEP; Insulin controlled gestational diabetes mellitus (GDM) during pregnancy, antepartum; and History of preterm delivery on their problem list.  ----------------------------------------------------------------------------------- Patient reports no complaints.   Contractions: Irritability. Vag. Bleeding: None.  Movement: Present. Leaking Fluid denies.  GDMA2: brings nearly complete BG log from last week.  Nearly all normal, some even slightly low. She has mild sx of hypoglycemia when low.  ----------------------------------------------------------------------------------- The following portions of the patient's history were reviewed and updated as appropriate: allergies, current medications, past family history, past medical history, past social history, past surgical history and problem list. Problem list updated.  Objective  Blood pressure 120/80, weight 171 lb (77.6 kg), last menstrual period 03/16/2020. Pregravid weight 170 lb (77.1 kg) Total Weight Gain 1 lb (0.454 kg) Urinalysis: Urine Protein    Urine Glucose    Fetal Status: Fetal Heart Rate (bpm): 135   Movement: Present  Presentation: Vertex  General:  Alert, oriented and cooperative. Patient is in no acute distress.  Skin: Skin is warm and dry. No rash noted.   Cardiovascular: Normal heart rate noted  Respiratory: Normal respiratory effort, no  problems with respiration noted  Abdomen: Soft, gravid, appropriate for gestational age. Pain/Pressure: Present     Pelvic:  Cervical exam deferred        Extremities: Normal range of motion.  Edema: None  Mental Status: Normal mood and affect. Normal behavior. Normal judgment and thought content.   NST: Baseline FHR: 135 beats/min Variability: moderate Accelerations: present Decelerations: absent Tocometry: not done  Interpretation:  INDICATIONS: gestational diabetes mellitus, chronic hypertension RESULTS:  A NST procedure was performed with FHR monitoring and a normal baseline established, appropriate time of 20-40 minutes of evaluation, and accels >2 seen w 15x15 characteristics.  Results show a REACTIVE NST.    Assessment   40 y.o. [id number] at [redacted]w[redacted]d by  12/21/2020, by Last Menstrual Period presenting for routine prenatal visit  Plan   THIRD Problems (from 05/28/20 to present)     Problem Noted Resolved   Insulin controlled gestational diabetes mellitus (GDM) during pregnancy, antepartum 10/16/2020 by Vena Austria, MD No   Overview Signed 10/16/2020 11:14 AM by Vena Austria, MD    10U qHS       Supervision of high risk pregnancy, antepartum 05/28/2020 by Zipporah Plants, CNM No   Overview Addendum 11/13/2020  3:04 PM by Nadara Mustard, MD     Nursing Staff Provider  Office Location  [facility] Dating   LMP = 10w [place]  Language  English Anatomy US  Normal female  Flu Vaccine   declines Genetic Screen  NIPS:nml XY  TDaP vaccine   10/16/2020 Hgb A1C or  GTT Early : 189  3-hr : 100 / 202 / 170 / 102  Rhogam   n/a   LAB RESULTS   Feeding Plan  breast Blood Type B/Positive/-- (01/20 1627)   Contraception Nuvaring; considering permanent Antibody Negative (01/20 1627)  Circumcision  Rubella 9.51 (01/20 1627)  Pediatrician   RPR Non Reactive (01/20 1627)  Support Person  Leonette Most [phone]) - Husband HBsAg Negative (01/20 1627)   Prenatal Classes  HIV Non Reactive (01/20 1627)     Varicella  IMM  BTL Consent  n/a GBS  (For PCN allergy, check sensitivities)        VBAC Consent  n/a Pap  08/16/19 - NILM   High Risk Pregnancy Diagnoses AMA Obesity class I (pregravid BMI >30) cHTN (on metoprolol prior to pregnancy) Hx of PTL (PPROM at 34w, NSVD, hx of LEEP)           Preterm labor symptoms and general obstetric precautions including but not limited to vaginal bleeding, contractions, leaking of fluid and fetal movement were reviewed in detail with the patient. Please refer to After Visit Summary for other counseling recommendations.   - continue current insulin dosing. If continues to get low values or is symptomatic, lower dose to 8 AM/8PM (currently 10 AM/10 PM)  Return in about 2 weeks (around 12/04/2020) for HROB with NST (keep 7/22 appt).   Thomasene Mohair, MD, [facility], [facility] 11/20/2020 10:58 AM

## 2020-11-25 ENCOUNTER — Encounter: Payer: Self-pay | Admitting: *Deleted

## 2020-11-25 ENCOUNTER — Ambulatory Visit: Payer: Medicaid Other | Attending: Obstetrics and Gynecology

## 2020-11-25 ENCOUNTER — Ambulatory Visit: Payer: Medicaid Other | Admitting: *Deleted

## 2020-11-25 ENCOUNTER — Other Ambulatory Visit: Payer: Self-pay

## 2020-11-25 VITALS — BP 113/79 | HR 103

## 2020-11-25 DIAGNOSIS — R000 Tachycardia, unspecified: Secondary | ICD-10-CM

## 2020-11-25 DIAGNOSIS — O10013 Pre-existing essential hypertension complicating pregnancy, third trimester: Secondary | ICD-10-CM

## 2020-11-25 DIAGNOSIS — O24414 Gestational diabetes mellitus in pregnancy, insulin controlled: Secondary | ICD-10-CM | POA: Diagnosis not present

## 2020-11-25 DIAGNOSIS — Z3A36 36 weeks gestation of pregnancy: Secondary | ICD-10-CM

## 2020-11-25 DIAGNOSIS — O321XX0 Maternal care for breech presentation, not applicable or unspecified: Secondary | ICD-10-CM

## 2020-11-25 DIAGNOSIS — O99213 Obesity complicating pregnancy, third trimester: Secondary | ICD-10-CM

## 2020-11-25 DIAGNOSIS — O0990 Supervision of high risk pregnancy, unspecified, unspecified trimester: Secondary | ICD-10-CM

## 2020-11-25 DIAGNOSIS — F17210 Nicotine dependence, cigarettes, uncomplicated: Secondary | ICD-10-CM

## 2020-11-25 DIAGNOSIS — O99413 Diseases of the circulatory system complicating pregnancy, third trimester: Secondary | ICD-10-CM

## 2020-11-25 DIAGNOSIS — O09523 Supervision of elderly multigravida, third trimester: Secondary | ICD-10-CM

## 2020-11-25 DIAGNOSIS — N879 Dysplasia of cervix uteri, unspecified: Secondary | ICD-10-CM

## 2020-11-25 DIAGNOSIS — O09213 Supervision of pregnancy with history of pre-term labor, third trimester: Secondary | ICD-10-CM

## 2020-11-25 DIAGNOSIS — O3443 Maternal care for other abnormalities of cervix, third trimester: Secondary | ICD-10-CM | POA: Diagnosis not present

## 2020-11-25 DIAGNOSIS — O99333 Smoking (tobacco) complicating pregnancy, third trimester: Secondary | ICD-10-CM

## 2020-11-25 DIAGNOSIS — E669 Obesity, unspecified: Secondary | ICD-10-CM

## 2020-11-25 DIAGNOSIS — Z794 Long term (current) use of insulin: Secondary | ICD-10-CM

## 2020-11-25 NOTE — Addendum Note (Signed)
Addended by: Thomasene Mohair D on: 11/25/2020 01:06 PM   Modules accepted: Orders, SmartSet

## 2020-11-27 ENCOUNTER — Other Ambulatory Visit: Payer: Self-pay

## 2020-11-27 ENCOUNTER — Ambulatory Visit (INDEPENDENT_AMBULATORY_CARE_PROVIDER_SITE_OTHER): Payer: Medicaid Other | Admitting: Obstetrics & Gynecology

## 2020-11-27 VITALS — BP 120/80 | Wt 168.0 lb

## 2020-11-27 DIAGNOSIS — O0993 Supervision of high risk pregnancy, unspecified, third trimester: Secondary | ICD-10-CM

## 2020-11-27 DIAGNOSIS — Z3685 Encounter for antenatal screening for Streptococcus B: Secondary | ICD-10-CM | POA: Diagnosis not present

## 2020-11-27 DIAGNOSIS — O24414 Gestational diabetes mellitus in pregnancy, insulin controlled: Secondary | ICD-10-CM

## 2020-11-27 DIAGNOSIS — Z9889 Other specified postprocedural states: Secondary | ICD-10-CM

## 2020-11-27 DIAGNOSIS — O163 Unspecified maternal hypertension, third trimester: Secondary | ICD-10-CM

## 2020-11-27 DIAGNOSIS — O09523 Supervision of elderly multigravida, third trimester: Secondary | ICD-10-CM

## 2020-11-27 DIAGNOSIS — Z3A36 36 weeks gestation of pregnancy: Secondary | ICD-10-CM

## 2020-11-27 DIAGNOSIS — O321XX0 Maternal care for breech presentation, not applicable or unspecified: Secondary | ICD-10-CM

## 2020-11-27 NOTE — Progress Notes (Signed)
History and Physical  Tami Foster is a 40 y.o. [id number] [redacted]w[redacted]d  for Induction of Labor scheduled due to  High Risk Pregnancy w Insulin equiring Diabetes, and more recently breech presentation and polyhydramnios  .   Other risk factors ear;lier in pregnancy were prior LEEP and prior PTD, AMA, and cHTN although no meds needed.  See labor record for pregnancy highlights.  No recent pain, bleeding, ruptured membranes, or other signs of progressing labor.  PMHx: She  has a past medical history of Abnormal Pap smear, Anxiety, Depression, GERD (gastroesophageal reflux disease), Gestational diabetes, and Hypertension. Also,  has a past surgical history that includes Periurethral abscess drainage; LEEP; and Wisdom tooth extraction., family history includes Cancer in her mother; Cancer (age of onset: 1) in her father; Diabetes in her paternal grandmother; Emphysema in her father; Hypertension in her father and mother.,  reports that she has been smoking cigarettes. She has a 7.00 pack-year smoking history. She has never used smokeless tobacco. She reports previous alcohol use. She reports that she does not use drugs. She has a current medication list which includes the following prescription(s): accu-chek softclix lancets, albuterol, alprazolam, butalbital-apap-caffeine, freestyle libre 2 reader, freestyle libre 2 sensor, doxylamine-pyridoxine, accu-chek guide, proctofoam hc, lantus solostar, insulin pen needle, ipratropium, ondansetron, and prenatal vit-fe fumarate-fa. Also, has No Known Allergies. OB History  Gravida Para Term Preterm AB Living  3 1   1 1 1   SAB IAB Ectopic Multiple Live Births  1       1    # Outcome Date GA Lbr Len/2nd Weight Sex Delivery Anes PTL Lv  3 Current           2 Preterm 02/28/13 [redacted]w[redacted]d 06:52 / 02:36 4 lb 10.8 oz (2.121 kg) M Vag-Spont EPI, Local  LIV  1 SAB           Patient denies any other pertinent gynecologic issues.   Review of Systems  Constitutional:  Negative for  chills, fever and malaise/fatigue.  HENT:  Negative for congestion, sinus pain and sore throat.   Eyes:  Negative for blurred vision and pain.  Respiratory:  Negative for cough and wheezing.   Cardiovascular:  Negative for chest pain and leg swelling.  Gastrointestinal:  Negative for abdominal pain, constipation, diarrhea, heartburn, nausea and vomiting.  Genitourinary:  Negative for dysuria, frequency, hematuria and urgency.  Musculoskeletal:  Negative for back pain, joint pain, myalgias and neck pain.  Skin:  Negative for itching and rash.  Neurological:  Negative for dizziness, tremors and weakness.  Endo/Heme/Allergies:  Does not bruise/bleed easily.  Psychiatric/Behavioral:  Negative for depression. The patient is not nervous/anxious and does not have insomnia.    Objective: BP 120/80   Wt 168 lb (76.2 kg)   LMP 03/16/2020 (Approximate)   BMI 30.73 kg/m  Physical Exam Constitutional:      General: She is not in acute distress.    Appearance: She is well-developed.  Genitourinary:     Bladder, rectum and urethral meatus normal.     No lesions in the vagina.     Right Labia: No rash, tenderness or lesions.    Left Labia: No tenderness, lesions or rash.    No vaginal bleeding.      Right Adnexa: not tender and no mass present.    Left Adnexa: not tender and no mass present.    No cervical motion tenderness, friability, lesion or polyp.     Cervical exam comments: 4/80/-3 BREECH.  Uterus is not enlarged.     No uterine mass detected.    Pelvic exam was performed with patient in the lithotomy position.  Breasts:    Right: No mass, skin change or tenderness.     Left: No mass, skin change or tenderness.  HENT:     Head: Normocephalic and atraumatic. No laceration.     Right Ear: Hearing normal.     Left Ear: Hearing normal.     Mouth/Throat:     Pharynx: Uvula midline.  Eyes:     Pupils: Pupils are equal, round, and reactive to light.  Neck:     Thyroid: No  thyromegaly.  Cardiovascular:     Rate and Rhythm: Normal rate and regular rhythm.     Heart sounds: No murmur heard.   No friction rub. No gallop.  Pulmonary:     Effort: Pulmonary effort is normal. No respiratory distress.     Breath sounds: Normal breath sounds. No wheezing.  Abdominal:     General: Bowel sounds are normal. There is no distension.     Palpations: Abdomen is soft.     Tenderness: There is no abdominal tenderness. There is no rebound.     Comments: NST R 150s Breech  Musculoskeletal:        General: Normal range of motion.     Cervical back: Normal range of motion and neck supple.  Neurological:     Mental Status: She is alert and oriented to person, place, and time.     Cranial Nerves: No cranial nerve deficit.  Skin:    General: Skin is warm and dry.  Psychiatric:        Judgment: Judgment normal.  Vitals reviewed.    Assessment: Term Pregnancy for Induction of Labor due to A2 DM. Breech. AMA.  H/o PTD, LEEP, cHTN.  Plan ECV, if successful then immediate IOL.  If not, then immediate CS.  Desires BTL but tubal papers not signed in time.  Will await 30 days or plan alternative contraception  Patient has been fully informed of the pros and cons, risks and benefits of continued observation with fetal monitoring versus that of induction of labor.   She understands that there are uncommon risks to induction, which include but are not limited to : frequent or prolonged uterine contractions, fetal distress, uterine rupture, and lack of successful induction.  These risks include all methods including Pitocin and Misoprostol and Cervadil.  Patient understands that using Misoprostol for labor induction is an "off label" indication although it has been studied extensively for this purpose and is an accepted method of induction.  She also has been informed of the increased risks for Cesarean with induction and should induction not be successful.  Patient consents to the  induction plan of management.  Plans to breast feed Plans bilateral tubal ligation for contraception TDaP UTD  Annamarie Major, MD, [facility], [facility] 11/27/2020  3:47 PM

## 2020-11-27 NOTE — Progress Notes (Signed)
ULTRASOUND REPORT  Location: [facility] Date of Service: 11/27/2020   Indications:AFI Findings:  Mason Jim intrauterine pregnancy is visualized with FHR at 150 BPM.  Fetal presentation is Breech.  Placenta: posterior. Grade: 1 AFI: 18 cm  Impression: 1. [redacted]w[redacted]d Viable Singleton Intrauterine pregnancy dated by previously established criteria. 2. AFI is 18 cm.   Recommendations: 1.Clinical correlation with the patient's History and Physical Exam. 2. Plan ECV at 37 weeks  [name], MD

## 2020-11-27 NOTE — Patient Instructions (Signed)
Fetal Positions THURS [facility]  In the final weeks of your pregnancy, your baby usually moves into a head-down (vertex) position to get ready for birth. As a normal delivery proceeds through the stages of labor, the baby tucks in the chin and turns to face your back. In this position, the back of your baby's head starts to show (crown) first through your open cervix. Sometimes your baby may be in a different, abnormal position just before birth. These positions are called malpositions or malpresentations. Giving birth can be more difficult if your baby is in anabnormal position. Abnormal fetal positions There are five main abnormal fetal positions: Occiput posterior presentation. This is the most common abnormal fetal position. It is sometimes called the sunny-side up position because your baby's face points toward your front instead of your back. Breech presentation. This is also common. In this position, your baby's bottom or feet are in position to come out first. Face or brow presentation. In this position, your baby is head down, but the face or the front of the head crowns first. Compound presentation. In this position, your baby's hand or leg comes out along with the head or bottom. Transverse presentation. In this position, your baby is lying sideways across your birth canal. Your baby's shoulder may come out first. Your health care provider can diagnose an abnormal fetal position during a physical exam as your due date approaches. An abnormal fetal position may be found by feeling your belly and by doing an internal (pelvic) exam. A sound wave imaging study (fetal ultrasound) can be done to confirm the abnormal position. What causes an abnormal fetal position? In many cases, the cause for an abnormal fetal position is not known. You may be at higher risk of having a baby in an abnormal fetal position if: You have an abnormally shaped womb (uterus) or pelvis. You have growths in your uterus,  such as fibroids. Your placenta is large or in an abnormal position. You are having twins or multiples. You have too much or too little amniotic fluid. Your baby has some type of developmental abnormality. You go into early (premature) labor. How does this affect me? In some cases, your baby may abruptly move into a vertex position just before or during labor. However, an abnormal fetal position increases your risk for a long labor or the need for steps to be taken to help ensure a safe delivery. Your health care provider may need to: Turn the baby manually by pushing on your belly (external cephalic version). Use instruments, such as forceps or a suctioning device, to help get your baby through the birth canal (assisted delivery). Deliver your baby by cesarean delivery, also called a C-section. The exact effects on your delivery will depend on the position your baby is inright before birth. If your baby has an occiput posterior presentation: You may have to push longer and may have a longer labor. You may have more back pain. You may deliver vaginally, but you may also need an assisted delivery using forceps or vacuum device. You may need a cesarean delivery. If your baby is breech: Your health care provider may try a vaginal delivery. However, in most cases your health care provider will recommend a cesarean delivery to safely deliver your baby. If your baby is in a face or brow presentation: Your labor may be longer. You may be able to have a vaginal or assisted vaginal delivery. There is a higher-than-normal risk that you will need a  cesarean delivery. If your baby is in a compound presentation: Your health care provider may be able to change your baby's position manually. In most cases, this position requires a cesarean delivery. If your baby is in a transverse presentation: Your health care provider may be able to turn your baby manually. In most cases, this position requires a  cesarean delivery. How does this affect my baby? Most babies are not affected by an abnormal fetal position, but there is a higher risk of some complications, including: Swelling and bruising. Birth injuries. Not getting enough oxygen during birth. Summary The normal fetal position for birth is head down and facing toward your back (vertex position). Abnormal fetal positions include occiput posterior, breech, face or brow presentation, compound presentation, and transverse position. In some cases, your baby may abruptly move into a normal position before birth, or your health care provider may be able to change your baby's position manually. If your baby is in an abnormal fetal position at the time of birth, you have a greater risk of a longer labor, assisted delivery, or a cesarean delivery. This information is not intended to replace advice given to you by your health care provider. Make sure you discuss any questions you have with your healthcare provider. Document Revised: 02/10/2020 Document Reviewed: 02/10/2020 Elsevier Patient Education  2022 [employer].

## 2020-12-01 ENCOUNTER — Encounter
Admission: RE | Admit: 2020-12-01 | Discharge: 2020-12-01 | Disposition: A | Discharge status: Home / Self Care | Payer: Medicaid Other | Source: Ambulatory Visit | Attending: Obstetrics & Gynecology | Admitting: Obstetrics & Gynecology

## 2020-12-01 ENCOUNTER — Other Ambulatory Visit: Payer: Self-pay

## 2020-12-01 HISTORY — DX: Tachycardia, unspecified: R00.0

## 2020-12-01 HISTORY — DX: Family history of other specified conditions: Z84.89

## 2020-12-01 LAB — CULTURE, BETA STREP (GROUP B ONLY): Strep Gp B Culture: NEGATIVE

## 2020-12-01 NOTE — Patient Instructions (Signed)
Your procedure is scheduled on: Thursday 12/03/20 Report to THE [facility], ENTER THRU THE MEDICAL MALL ENTRANCE. ARRIVAL TIME 9:15 IF YOU HAVE ANY QUESTIONS REGARDING VISITATION CALL [phone].  Remember: Instructions that are not followed completely may result in serious medical risk, up to and including death, or upon the discretion of your surgeon and anesthesiologist your surgery may need to be rescheduled.     _X__ 1. Do not eat food or drink any liquids after midnight the night before your procedure.                 No gum chewing or hard candies. Y  __X__2.  On the morning of surgery brush your teeth with toothpaste and water, you                 may rinse your mouth with mouthwash if you wish.  Do not swallow any              toothpaste of mouthwash.     _X__ 3.  No Alcohol for 24 hours before or after surgery.   _X__ 4.  Do Not Smoke or use e-cigarettes For 24 Hours Prior to Your Surgery.                 Do not use any chewable tobacco products for at least 6 hours prior to                 surgery.  ____  5.  Bring all medications with you on the day of surgery if instructed.   __X__  6.  Notify your doctor if there is any change in your medical condition      (cold, fever, infections).     Do not wear jewelry, make-up, hairpins, clips or nail polish. Do not wear lotions, powders, or perfumes.  Do not shave 48 hours prior to surgery. Men may shave face and neck. Do not bring valuables to the hospital.    [facility] is not responsible for any belongings or valuables.  Contacts, dentures/partials or body piercings may not be worn into surgery. Bring a case for your contacts, glasses or hearing aids, a denture cup will be supplied. Leave your suitcase in the car. After surgery it may be brought to your room. For patients admitted to the hospital, discharge time is determined by your treatment team.   Patients discharged the day of surgery will not be allowed to drive  home.   Please read over the following fact sheets that you were given:   CHG SOAP  __X__ Take these medicines the morning of surgery with A SIP OF WATER:    1. NONE  2.   3.   4.  5.  6.  ____ Fleet Enema (as directed)   __X__ Use CHG Soap/SAGE wipes as directed  (see instructions)  ____ Use inhalers on the day of surgery  ____ Stop metformin/Janumet/Farxiga 2 days prior to surgery    ____ Take 1/2 of usual insulin dose the night before surgery. No insulin the morning          of surgery.   ____ Stop Blood Thinners Coumadin/Plavix/Xarelto/Pleta/Pradaxa/Eliquis/Effient/Aspirin  on   Or contact your Surgeon, Cardiologist or Medical Doctor regarding  ability to stop your blood thinners  __X__ Stop Anti-inflammatories 7 days before surgery such as Advil, Ibuprofen, Motrin,  BC or Goodies Powder, Naprosyn, Naproxen, Aleve, Aspirin    __X__ Stop all herbal supplements, fish oil or vitamin E until after  surgery.    ____ Bring C-Pap to the hospital.

## 2020-12-02 ENCOUNTER — Other Ambulatory Visit: Admission: RE | Admit: 2020-12-02 | Payer: Medicaid Other | Source: Ambulatory Visit

## 2020-12-02 ENCOUNTER — Ambulatory Visit: Payer: Medicaid Other

## 2020-12-02 ENCOUNTER — Encounter
Admission: RE | Admit: 2020-12-02 | Discharge: 2020-12-02 | Disposition: A | Discharge status: Home / Self Care | Payer: Medicaid Other | Source: Ambulatory Visit | Attending: Obstetrics & Gynecology | Admitting: Obstetrics & Gynecology

## 2020-12-02 ENCOUNTER — Encounter: Payer: Self-pay | Admitting: Urgent Care

## 2020-12-02 ENCOUNTER — Inpatient Hospital Stay: Payer: Medicaid Other | Admitting: Anesthesiology

## 2020-12-02 DIAGNOSIS — Z20822 Contact with and (suspected) exposure to covid-19: Secondary | ICD-10-CM | POA: Insufficient documentation

## 2020-12-02 DIAGNOSIS — O0991 Supervision of high risk pregnancy, unspecified, first trimester: Secondary | ICD-10-CM

## 2020-12-02 DIAGNOSIS — Z01812 Encounter for preprocedural laboratory examination: Secondary | ICD-10-CM | POA: Insufficient documentation

## 2020-12-02 DIAGNOSIS — O24419 Gestational diabetes mellitus in pregnancy, unspecified control: Secondary | ICD-10-CM

## 2020-12-02 LAB — TYPE AND SCREEN
ABO/RH(D): B POS
Antibody Screen: NEGATIVE
Extend sample reason: UNDETERMINED

## 2020-12-02 LAB — CBC
HCT: 40.2 % (ref 36.0–46.0)
Hemoglobin: 13.6 g/dL (ref 12.0–15.0)
MCH: 31.6 pg (ref 26.0–34.0)
MCHC: 33.8 g/dL (ref 30.0–36.0)
MCV: 93.5 fL (ref 80.0–100.0)
Platelets: 234 10*3/uL (ref 150–400)
RBC: 4.3 MIL/uL (ref 3.87–5.11)
RDW: 15.1 % (ref 11.5–15.5)
WBC: 13.4 10*3/uL — ABNORMAL HIGH (ref 4.0–10.5)
nRBC: 0 % (ref 0.0–0.2)

## 2020-12-02 LAB — SARS CORONAVIRUS 2 (TAT 6-24 HRS): SARS Coronavirus 2: NEGATIVE

## 2020-12-03 ENCOUNTER — Inpatient Hospital Stay: Payer: Medicaid Other | Admitting: Anesthesiology

## 2020-12-03 ENCOUNTER — Other Ambulatory Visit: Payer: Self-pay

## 2020-12-03 ENCOUNTER — Encounter: Payer: Self-pay | Admitting: Obstetrics and Gynecology

## 2020-12-03 ENCOUNTER — Encounter
Admission: RE | Disposition: A | Discharge status: Home / Self Care | Payer: Self-pay | Source: Home / Self Care | Attending: Obstetrics & Gynecology

## 2020-12-03 ENCOUNTER — Inpatient Hospital Stay
Admission: RE | Admit: 2020-12-03 | Discharge: 2020-12-05 | DRG: 787 | Disposition: A | Discharge status: Home / Self Care | Payer: Medicaid Other | Attending: Obstetrics & Gynecology | Admitting: Obstetrics & Gynecology

## 2020-12-03 DIAGNOSIS — O99345 Other mental disorders complicating the puerperium: Secondary | ICD-10-CM | POA: Diagnosis not present

## 2020-12-03 DIAGNOSIS — O9081 Anemia of the puerperium: Secondary | ICD-10-CM | POA: Diagnosis not present

## 2020-12-03 DIAGNOSIS — O169 Unspecified maternal hypertension, unspecified trimester: Secondary | ICD-10-CM | POA: Diagnosis present

## 2020-12-03 DIAGNOSIS — Z3A37 37 weeks gestation of pregnancy: Secondary | ICD-10-CM | POA: Diagnosis not present

## 2020-12-03 DIAGNOSIS — O321XX0 Maternal care for breech presentation, not applicable or unspecified: Secondary | ICD-10-CM | POA: Diagnosis not present

## 2020-12-03 DIAGNOSIS — Z3490 Encounter for supervision of normal pregnancy, unspecified, unspecified trimester: Secondary | ICD-10-CM | POA: Diagnosis present

## 2020-12-03 DIAGNOSIS — D62 Acute posthemorrhagic anemia: Secondary | ICD-10-CM | POA: Diagnosis not present

## 2020-12-03 DIAGNOSIS — O1002 Pre-existing essential hypertension complicating childbirth: Secondary | ICD-10-CM | POA: Diagnosis present

## 2020-12-03 DIAGNOSIS — O24424 Gestational diabetes mellitus in childbirth, insulin controlled: Secondary | ICD-10-CM | POA: Diagnosis present

## 2020-12-03 DIAGNOSIS — Z20822 Contact with and (suspected) exposure to covid-19: Secondary | ICD-10-CM | POA: Diagnosis not present

## 2020-12-03 DIAGNOSIS — O24414 Gestational diabetes mellitus in pregnancy, insulin controlled: Secondary | ICD-10-CM

## 2020-12-03 DIAGNOSIS — F53 Postpartum depression: Secondary | ICD-10-CM | POA: Diagnosis not present

## 2020-12-03 DIAGNOSIS — Z3A00 Weeks of gestation of pregnancy not specified: Secondary | ICD-10-CM | POA: Diagnosis not present

## 2020-12-03 DIAGNOSIS — O0990 Supervision of high risk pregnancy, unspecified, unspecified trimester: Secondary | ICD-10-CM

## 2020-12-03 LAB — RPR: RPR Ser Ql: NONREACTIVE

## 2020-12-03 LAB — GLUCOSE, CAPILLARY: Glucose-Capillary: 85 mg/dL (ref 70–99)

## 2020-12-03 SURGERY — Surgical Case
Anesthesia: Spinal

## 2020-12-03 MED ORDER — MISOPROSTOL 25 MCG QUARTER TABLET: 25.0000 ug | ORAL_TABLET | ORAL | Status: DC | PRN

## 2020-12-03 MED ORDER — SIMETHICONE 80 MG PO CHEW
80.0000 mg | CHEWABLE_TABLET | Freq: Three times a day (TID) | ORAL | Status: DC
  Administered 2020-12-03 – 2020-12-04 (×3): 80 mg via ORAL
  Filled 2020-12-03 (×3): qty 1

## 2020-12-03 MED ORDER — BUPIVACAINE HCL (PF) 0.25 % IJ SOLN
INTRAMUSCULAR | Status: DC | PRN
  Administered 2020-12-03 (×2): 5 mL via EPIDURAL

## 2020-12-03 MED ORDER — COCONUT OIL OIL
1.0000 "application " | TOPICAL_OIL | Status: DC | PRN
  Filled 2020-12-03: qty 120

## 2020-12-03 MED ORDER — PRENATAL MULTIVITAMIN CH
1.0000 | ORAL_TABLET | Freq: Every day | ORAL | Status: DC
  Administered 2020-12-04: 1 via ORAL
  Filled 2020-12-03: qty 1

## 2020-12-03 MED ORDER — PHENYLEPHRINE HCL (PRESSORS) 10 MG/ML IV SOLN
INTRAVENOUS | Status: AC
  Filled 2020-12-03: qty 1

## 2020-12-03 MED ORDER — TERBUTALINE SULFATE 1 MG/ML IJ SOLN
0.2500 mg | Freq: Once | INTRAMUSCULAR | Status: AC | PRN
  Administered 2020-12-03: 0.25 mg via SUBCUTANEOUS
  Filled 2020-12-03: qty 1

## 2020-12-03 MED ORDER — SOD CITRATE-CITRIC ACID 500-334 MG/5ML PO SOLN
ORAL | Status: AC
  Filled 2020-12-03: qty 15

## 2020-12-03 MED ORDER — BUPIVACAINE HCL (PF) 0.25 % IJ SOLN
INTRAMUSCULAR | Status: DC | PRN
  Administered 2020-12-03: 1 mL via INTRATHECAL

## 2020-12-03 MED ORDER — IBUPROFEN 600 MG PO TABS
600.0000 mg | ORAL_TABLET | Freq: Four times a day (QID) | ORAL | Status: DC
  Administered 2020-12-04 – 2020-12-05 (×4): 600 mg via ORAL
  Filled 2020-12-03 (×4): qty 1

## 2020-12-03 MED ORDER — KETOROLAC TROMETHAMINE 30 MG/ML IJ SOLN
30.0000 mg | Freq: Four times a day (QID) | INTRAMUSCULAR | Status: AC
  Administered 2020-12-03 – 2020-12-04 (×4): 30 mg via INTRAVENOUS
  Filled 2020-12-03 (×4): qty 1

## 2020-12-03 MED ORDER — NALBUPHINE HCL 10 MG/ML IJ SOLN: 5.0000 mg | Freq: Once | INTRAMUSCULAR | Status: DC | PRN

## 2020-12-03 MED ORDER — MEPERIDINE HCL 25 MG/ML IJ SOLN: 6.2500 mg | INTRAMUSCULAR | Status: DC | PRN

## 2020-12-03 MED ORDER — DIPHENHYDRAMINE HCL 25 MG PO CAPS: 25.0000 mg | ORAL_CAPSULE | Freq: Four times a day (QID) | ORAL | Status: DC | PRN

## 2020-12-03 MED ORDER — WITCH HAZEL-GLYCERIN EX PADS: 1.0000 "application " | MEDICATED_PAD | CUTANEOUS | Status: DC | PRN

## 2020-12-03 MED ORDER — SODIUM CHLORIDE 0.9 % IV SOLN
INTRAVENOUS | Status: AC
  Filled 2020-12-03: qty 2

## 2020-12-03 MED ORDER — ZOLPIDEM TARTRATE 5 MG PO TABS: 5.0000 mg | ORAL_TABLET | Freq: Every evening | ORAL | Status: DC | PRN

## 2020-12-03 MED ORDER — NALOXONE HCL 4 MG/10ML IJ SOLN
1.0000 ug/kg/h | INTRAVENOUS | Status: DC | PRN
  Filled 2020-12-03: qty 5

## 2020-12-03 MED ORDER — KETOROLAC TROMETHAMINE 30 MG/ML IJ SOLN: 30.0000 mg | Freq: Four times a day (QID) | INTRAMUSCULAR | Status: AC | PRN

## 2020-12-03 MED ORDER — OXYCODONE HCL 5 MG PO TABS
ORAL_TABLET | ORAL | Status: AC
  Filled 2020-12-03: qty 2

## 2020-12-03 MED ORDER — ACETAMINOPHEN 325 MG PO TABS
650.0000 mg | ORAL_TABLET | ORAL | Status: DC | PRN
  Administered 2020-12-04 – 2020-12-05 (×3): 650 mg via ORAL
  Filled 2020-12-03 (×5): qty 2

## 2020-12-03 MED ORDER — EPHEDRINE SULFATE 50 MG/ML IJ SOLN
INTRAMUSCULAR | Status: DC | PRN
  Administered 2020-12-03 (×2): 5 mg via INTRAVENOUS

## 2020-12-03 MED ORDER — SODIUM CHLORIDE 0.9 % IV SOLN
INTRAVENOUS | Status: DC | PRN
  Administered 2020-12-03: 30 ug/min via INTRAVENOUS

## 2020-12-03 MED ORDER — BUPIVACAINE ON-Q PAIN PUMP (FOR ORDER SET NO CHG)
INJECTION | Status: DC
  Filled 2020-12-03: qty 1

## 2020-12-03 MED ORDER — LIDOCAINE HCL (PF) 1 % IJ SOLN
INTRAMUSCULAR | Status: DC | PRN
  Administered 2020-12-03: 3 mL via SUBCUTANEOUS

## 2020-12-03 MED ORDER — NALBUPHINE HCL 10 MG/ML IJ SOLN: 5.0000 mg | INTRAMUSCULAR | Status: DC | PRN

## 2020-12-03 MED ORDER — KETOROLAC TROMETHAMINE 30 MG/ML IJ SOLN
INTRAMUSCULAR | Status: AC
  Filled 2020-12-03: qty 1

## 2020-12-03 MED ORDER — OXYCODONE HCL 5 MG PO TABS
5.0000 mg | ORAL_TABLET | ORAL | Status: DC | PRN
  Administered 2020-12-03 – 2020-12-04 (×7): 10 mg via ORAL
  Administered 2020-12-05: 5 mg via ORAL
  Filled 2020-12-03 (×3): qty 2
  Filled 2020-12-03 (×3): qty 1
  Filled 2020-12-03: qty 2

## 2020-12-03 MED ORDER — LACTATED RINGERS IV SOLN: INTRAVENOUS | Status: DC

## 2020-12-03 MED ORDER — OXYTOCIN BOLUS FROM INFUSION: 333.0000 mL | Freq: Once | INTRAVENOUS | Status: DC

## 2020-12-03 MED ORDER — DIPHENHYDRAMINE HCL 50 MG/ML IJ SOLN: 12.5000 mg | INTRAMUSCULAR | Status: DC | PRN

## 2020-12-03 MED ORDER — ONDANSETRON HCL 4 MG/2ML IJ SOLN: 4.0000 mg | Freq: Four times a day (QID) | INTRAMUSCULAR | Status: DC | PRN

## 2020-12-03 MED ORDER — PHENYLEPHRINE HCL (PRESSORS) 10 MG/ML IV SOLN
INTRAVENOUS | Status: DC | PRN
  Administered 2020-12-03: 100 ug via INTRAVENOUS

## 2020-12-03 MED ORDER — FENTANYL 2.5 MCG/ML W/ROPIVACAINE 0.15% IN NS 100 ML EPIDURAL (ARMC): EPIDURAL | Status: DC | PRN

## 2020-12-03 MED ORDER — MENTHOL 3 MG MT LOZG
1.0000 | LOZENGE | OROMUCOSAL | Status: DC | PRN
  Filled 2020-12-03: qty 9

## 2020-12-03 MED ORDER — ONDANSETRON HCL 4 MG/2ML IJ SOLN
INTRAMUSCULAR | Status: DC | PRN
Start: 2020-12-03 — End: 2020-12-03
  Administered 2020-12-03: 4 mg via INTRAVENOUS

## 2020-12-03 MED ORDER — SIMETHICONE 80 MG PO CHEW
80.0000 mg | CHEWABLE_TABLET | ORAL | Status: DC | PRN
  Administered 2020-12-04: 80 mg via ORAL

## 2020-12-03 MED ORDER — LIDOCAINE HCL (PF) 1 % IJ SOLN: 30.0000 mL | INTRAMUSCULAR | Status: DC | PRN

## 2020-12-03 MED ORDER — SOD CITRATE-CITRIC ACID 500-334 MG/5ML PO SOLN: 30.0000 mL | ORAL | Status: DC

## 2020-12-03 MED ORDER — FENTANYL-BUPIVACAINE-NACL 0.5-0.125-0.9 MG/250ML-% EP SOLN
EPIDURAL | Status: DC | PRN
  Administered 2020-12-03: 12 mL/h via EPIDURAL

## 2020-12-03 MED ORDER — ACETAMINOPHEN 500 MG PO TABS
ORAL_TABLET | ORAL | Status: AC
  Administered 2020-12-03: 1000 mg via ORAL
  Filled 2020-12-03: qty 2

## 2020-12-03 MED ORDER — BUPIVACAINE HCL 0.5 % IJ SOLN
10.0000 mL | Freq: Once | INTRAMUSCULAR | Status: DC
  Filled 2020-12-03: qty 10

## 2020-12-03 MED ORDER — LIDOCAINE-EPINEPHRINE (PF) 2 %-1:200000 IJ SOLN
INTRAMUSCULAR | Status: DC | PRN
  Administered 2020-12-03: 5 mL via INTRADERMAL
  Administered 2020-12-03: 2 mL via INTRADERMAL
  Administered 2020-12-03: 3 mL via INTRADERMAL
  Administered 2020-12-03: 5 mL via INTRADERMAL

## 2020-12-03 MED ORDER — MORPHINE SULFATE (PF) 2 MG/ML IV SOLN
1.0000 mg | INTRAVENOUS | Status: DC | PRN
  Administered 2020-12-03: 2 mg via INTRAVENOUS
  Filled 2020-12-03: qty 1

## 2020-12-03 MED ORDER — OXYTOCIN-SODIUM CHLORIDE 30-0.9 UT/500ML-% IV SOLN
2.5000 [IU]/h | INTRAVENOUS | Status: AC
  Administered 2020-12-03: 2.5 [IU]/h via INTRAVENOUS

## 2020-12-03 MED ORDER — BUPIVACAINE HCL (PF) 0.5 % IJ SOLN
INTRAMUSCULAR | Status: AC
  Filled 2020-12-03: qty 30

## 2020-12-03 MED ORDER — LACTATED RINGERS IV SOLN
500.0000 mL | INTRAVENOUS | Status: DC | PRN
Start: 2020-12-03 — End: 2020-12-03
  Administered 2020-12-03: 500 mL via INTRAVENOUS

## 2020-12-03 MED ORDER — OXYTOCIN-SODIUM CHLORIDE 30-0.9 UT/500ML-% IV SOLN
2.5000 [IU]/h | INTRAVENOUS | Status: DC
  Administered 2020-12-03: 30 [IU] via INTRAVENOUS
  Filled 2020-12-03: qty 1000

## 2020-12-03 MED ORDER — OXYTOCIN 10 UNIT/ML IJ SOLN: 10.0000 [IU] | Freq: Once | INTRAMUSCULAR | Status: DC

## 2020-12-03 MED ORDER — LACTATED RINGERS IV SOLN
INTRAVENOUS | Status: DC
  Administered 2020-12-03: 500 mL via INTRAVENOUS

## 2020-12-03 MED ORDER — SODIUM CHLORIDE 0.9 % IV SOLN
2.0000 g | INTRAVENOUS | Status: AC
  Administered 2020-12-03: 2 g via INTRAVENOUS

## 2020-12-03 MED ORDER — DIBUCAINE (PERIANAL) 1 % EX OINT: 1.0000 "application " | TOPICAL_OINTMENT | CUTANEOUS | Status: DC | PRN

## 2020-12-03 MED ORDER — FENTANYL-BUPIVACAINE-NACL 0.5-0.125-0.9 MG/250ML-% EP SOLN
EPIDURAL | Status: AC
  Filled 2020-12-03: qty 250

## 2020-12-03 MED ORDER — DIPHENHYDRAMINE HCL 25 MG PO CAPS: 25.0000 mg | ORAL_CAPSULE | ORAL | Status: DC | PRN

## 2020-12-03 MED ORDER — POVIDONE-IODINE 10 % EX SWAB: 2.0000 "application " | Freq: Once | CUTANEOUS | Status: DC

## 2020-12-03 MED ORDER — NALOXONE HCL 0.4 MG/ML IJ SOLN: 0.4000 mg | INTRAMUSCULAR | Status: DC | PRN

## 2020-12-03 MED ORDER — OXYTOCIN-SODIUM CHLORIDE 30-0.9 UT/500ML-% IV SOLN
1.0000 m[IU]/min | INTRAVENOUS | Status: DC
Start: 2020-12-03 — End: 2020-12-03

## 2020-12-03 MED ORDER — ACETAMINOPHEN 500 MG PO TABS
1000.0000 mg | ORAL_TABLET | Freq: Four times a day (QID) | ORAL | Status: AC
  Administered 2020-12-03 – 2020-12-04 (×3): 1000 mg via ORAL
  Filled 2020-12-03 (×3): qty 2

## 2020-12-03 MED ORDER — SODIUM CHLORIDE 0.9% FLUSH: 3.0000 mL | INTRAVENOUS | Status: DC | PRN

## 2020-12-03 MED ORDER — SCOPOLAMINE 1 MG/3DAYS TD PT72: 1.0000 | MEDICATED_PATCH | Freq: Once | TRANSDERMAL | Status: DC

## 2020-12-03 MED ORDER — ONDANSETRON HCL 4 MG/2ML IJ SOLN: 4.0000 mg | Freq: Three times a day (TID) | INTRAMUSCULAR | Status: DC | PRN

## 2020-12-03 MED ORDER — SOD CITRATE-CITRIC ACID 500-334 MG/5ML PO SOLN
30.0000 mL | ORAL | Status: DC | PRN
  Administered 2020-12-03: 30 mL via ORAL

## 2020-12-03 MED ORDER — BUPIVACAINE 0.25 % ON-Q PUMP DUAL CATH 400 ML
400.0000 mL | INJECTION | Status: DC
  Filled 2020-12-03 (×2): qty 400

## 2020-12-03 MED ORDER — SENNOSIDES-DOCUSATE SODIUM 8.6-50 MG PO TABS
2.0000 | ORAL_TABLET | ORAL | Status: DC
  Administered 2020-12-03 – 2020-12-04 (×2): 2 via ORAL
  Filled 2020-12-03 (×2): qty 2

## 2020-12-03 SURGICAL SUPPLY — 26 items
BACTOSHIELD CHG 4% 4OZ (MISCELLANEOUS) ×1
CATH KIT ON-Q SILVERSOAK 5IN (CATHETERS) ×4 IMPLANT
CHLORAPREP W/TINT 26 (MISCELLANEOUS) ×4 IMPLANT
DERMABOND ADVANCED (GAUZE/BANDAGES/DRESSINGS) ×1
DERMABOND ADVANCED .7 DNX12 (GAUZE/BANDAGES/DRESSINGS) ×1 IMPLANT
DRSG OPSITE POSTOP 4X10 (GAUZE/BANDAGES/DRESSINGS) ×2 IMPLANT
ELECT CAUTERY BLADE 6.4 (BLADE) ×2 IMPLANT
ELECT REM PT RETURN 9FT ADLT (ELECTROSURGICAL) ×2
ELECTRODE REM PT RTRN 9FT ADLT (ELECTROSURGICAL) ×1 IMPLANT
GLOVE SURG NEOPR MICRO LF SZ8 (GLOVE) ×2 IMPLANT
GOWN STRL REUS W/ TWL LRG LVL3 (GOWN DISPOSABLE) ×1 IMPLANT
GOWN STRL REUS W/ TWL XL LVL3 (GOWN DISPOSABLE) ×2 IMPLANT
GOWN STRL REUS W/TWL LRG LVL3 (GOWN DISPOSABLE) ×2
GOWN STRL REUS W/TWL XL LVL3 (GOWN DISPOSABLE) ×4
MANIFOLD NEPTUNE II (INSTRUMENTS) ×2 IMPLANT
MAT PREVALON FULL STRYKER (MISCELLANEOUS) ×2 IMPLANT
NS IRRIG 1000ML POUR BTL (IV SOLUTION) ×2 IMPLANT
PACK C SECTION AR (MISCELLANEOUS) ×2 IMPLANT
PAD OB MATERNITY 4.3X12.25 (PERSONAL CARE ITEMS) ×2 IMPLANT
PAD PREP 24X41 OB/GYN DISP (PERSONAL CARE ITEMS) ×2 IMPLANT
PENCIL SMOKE EVACUATOR (MISCELLANEOUS) ×2 IMPLANT
SCRUB CHG 4% DYNA-HEX 4OZ (MISCELLANEOUS) ×1 IMPLANT
SUT MAXON ABS #0 GS21 30IN (SUTURE) ×4 IMPLANT
SUT VIC AB 1 CT1 36 (SUTURE) ×6 IMPLANT
SUT VIC AB 2-0 CT1 36 (SUTURE) ×2 IMPLANT
SUT VIC AB 4-0 FS2 27 (SUTURE) ×2 IMPLANT

## 2020-12-03 NOTE — Transfer of Care (Signed)
Immediate Anesthesia Transfer of Care Note  Patient: Tami Foster  Procedure(s) Performed: AN AD HOC LABOR EPIDURAL  Patient Location: PACU and Mother/Baby  Anesthesia Type:Epidural  Level of Consciousness: awake and patient cooperative  Airway & Oxygen Therapy: Patient Spontanous Breathing and Patient connected to nasal cannula oxygen  Post-op Assessment: Report given to RN and Post -op Vital signs reviewed and stable  Post vital signs: Reviewed and stable  Last Vitals:  Vitals Value Taken Time  BP 105/71 12/03/20 1345  Temp    Pulse 79 12/03/20 1345  Resp    SpO2 99 % 12/03/20 1345    Last Pain:  Vitals:   12/03/20 1009  TempSrc: Oral         Complications: No notable events documented.

## 2020-12-03 NOTE — Anesthesia Preprocedure Evaluation (Deleted)
Anesthesia Evaluation  Patient identified by MRN, date of birth, ID band Patient awake    Reviewed: Allergy & Precautions, NPO status , Patient's Chart, lab work & pertinent test results, reviewed documented beta blocker date and time   Airway Mallampati: II  TM Distance: >3 FB Neck ROM: Full    Dental no notable dental hx.    Pulmonary Current SmokerPatient did not abstain from smoking.,    Pulmonary exam normal        Cardiovascular hypertension (chronic hypertension that has not needed treatment during pregnancy), Normal cardiovascular exam     Neuro/Psych PSYCHIATRIC DISORDERS Anxiety Depression negative neurological ROS     GI/Hepatic Neg liver ROS, GERD  Poorly Controlled,  Endo/Other  diabetes, Well Controlled, Type 2, Insulin DependentObesity, Class I, BMI 30-34.9  Insulin controlled gestational diabetes mellitus (GDM) during pregnancy, antepartum  Renal/GU negative Renal ROS  negative genitourinary   Musculoskeletal negative musculoskeletal ROS (+)   Abdominal gravid  Peds  Hematology   Anesthesia Other Findings   Reproductive/Obstetrics (+) Pregnancy                                                            Anesthesia Evaluation  Patient identified by MRN, date of birth, ID band Patient awake    Reviewed: Allergy & Precautions, H&P , Patient's Chart, lab work & pertinent test results  Airway Mallampati: III TM Distance: >3 FB Neck ROM: full    Dental no notable dental hx. (+) Teeth Intact   Pulmonary Current Smoker,  breath sounds clear to auscultation  Pulmonary exam normal       Cardiovascular negative cardio ROS  Rhythm:regular Rate:Normal     Neuro/Psych negative neurological ROS  negative psych ROS   GI/Hepatic Neg liver ROS, GERD-  Medicated and Controlled,  Endo/Other  negative endocrine ROS  Renal/GU negative Renal ROS  negative  genitourinary   Musculoskeletal   Abdominal   Peds  Hematology negative hematology ROS (+)   Anesthesia Other Findings   Reproductive/Obstetrics (+) Pregnancy                           Anesthesia Physical Anesthesia Plan  ASA: II  Anesthesia Plan: Epidural   Post-op Pain Management:    Induction:   Airway Management Planned:   Additional Equipment:   Intra-op Plan:   Post-operative Plan:   Informed Consent: I have reviewed the patients History and Physical, chart, labs and discussed the procedure including the risks, benefits and alternatives for the proposed anesthesia with the patient or authorized representative who has indicated his/her understanding and acceptance.     Plan Discussed with: Anesthesiologist  Anesthesia Plan Comments:         Anesthesia Quick Evaluation  Anesthesia Physical Anesthesia Plan  ASA: 3  Anesthesia Plan: Combined Spinal and Epidural   Post-op Pain Management:    Induction:   PONV Risk Score and Plan:   Airway Management Planned:   Additional Equipment:   Intra-op Plan:   Post-operative Plan:   Informed Consent: I have reviewed the patients History and Physical, chart, labs and discussed the procedure including the risks, benefits and alternatives for the proposed anesthesia with the patient or authorized representative who has indicated his/her understanding and acceptance.  Plan Discussed with: CRNA, Anesthesiologist and Surgeon  Anesthesia Plan Comments:         Anesthesia Quick Evaluation

## 2020-12-03 NOTE — Anesthesia Preprocedure Evaluation (Signed)
Anesthesia Evaluation  Patient identified by MRN, date of birth, ID band Patient awake    Reviewed: Allergy & Precautions, NPO status , Patient's Chart, lab work & pertinent test results, reviewed documented beta blocker date and time   Airway Mallampati: II  TM Distance: >3 FB Neck ROM: Full    Dental no notable dental hx.    Pulmonary Current SmokerPatient did not abstain from smoking.,    Pulmonary exam normal        Cardiovascular hypertension, Normal cardiovascular exam     Neuro/Psych PSYCHIATRIC DISORDERS Anxiety Depression negative neurological ROS     GI/Hepatic Neg liver ROS, GERD  Poorly Controlled,  Endo/Other  diabetes, Well Controlled, Type 2, Insulin DependentObesity, Class I, BMI 30-34.9  Insulin controlled gestational diabetes mellitus (GDM) during pregnancy, antepartum  Renal/GU negative Renal ROS  negative genitourinary   Musculoskeletal negative musculoskeletal ROS (+)   Abdominal gravid  Peds  Hematology   Anesthesia Other Findings   Reproductive/Obstetrics (+) Pregnancy                                                              Anesthesia Evaluation  Patient identified by MRN, date of birth, ID band Patient awake    Reviewed: Allergy & Precautions, H&P , Patient's Chart, lab work & pertinent test results  Airway Mallampati: III TM Distance: >3 FB Neck ROM: full    Dental no notable dental hx. (+) Teeth Intact   Pulmonary Current Smoker,  breath sounds clear to auscultation  Pulmonary exam normal       Cardiovascular negative cardio ROS  Rhythm:regular Rate:Normal     Neuro/Psych negative neurological ROS  negative psych ROS   GI/Hepatic Neg liver ROS, GERD-  Medicated and Controlled,  Endo/Other  negative endocrine ROS  Renal/GU negative Renal ROS  negative genitourinary   Musculoskeletal   Abdominal   Peds   Hematology negative hematology ROS (+)   Anesthesia Other Findings   Reproductive/Obstetrics (+) Pregnancy                           Anesthesia Physical Anesthesia Plan  ASA: II  Anesthesia Plan: Epidural   Post-op Pain Management:    Induction:   Airway Management Planned:   Additional Equipment:   Intra-op Plan:   Post-operative Plan:   Informed Consent: I have reviewed the patients History and Physical, chart, labs and discussed the procedure including the risks, benefits and alternatives for the proposed anesthesia with the patient or authorized representative who has indicated his/her understanding and acceptance.     Plan Discussed with: Anesthesiologist  Anesthesia Plan Comments:         Anesthesia Quick Evaluation  Anesthesia Physical  Anesthesia Plan  ASA: 3  Anesthesia Plan: Combined Spinal and Epidural   Post-op Pain Management:    Induction:   PONV Risk Score and Plan:   Airway Management Planned:   Additional Equipment:   Intra-op Plan:   Post-operative Plan:   Informed Consent: I have reviewed the patients History and Physical, chart, labs and discussed the procedure including the risks, benefits and alternatives for the proposed anesthesia with the patient or authorized representative who has indicated his/her understanding and acceptance.       Plan Discussed  with: CRNA, Anesthesiologist and Surgeon  Anesthesia Plan Comments:         Anesthesia Quick Evaluation

## 2020-12-03 NOTE — Anesthesia Procedure Notes (Addendum)
Epidural Patient location during procedure: OB Start time: 12/03/2020 10:53 AM End time: 12/03/2020 11:33 AM  Staffing Anesthesiologist: Foye Deer, MD Resident/CRNA: Junious Silk, CRNA Other anesthesia staff: Irving Burton, CRNA Performed: resident/CRNA, anesthesiologist and other anesthesia staff   Preanesthetic Checklist Completed: patient identified, IV checked, site marked, risks and benefits discussed, surgical consent, monitors and equipment checked, pre-op evaluation and timeout performed  Epidural Patient position: sitting Prep: ChloraPrep Patient monitoring: heart rate, continuous pulse ox and blood pressure Approach: midline Location: L4-L5 Injection technique: LOR air  Needle:  Needle type: Tuohy  Needle gauge: 17 G Needle length: 9 cm and 9 Needle insertion depth: 7 cm Catheter type: closed end flexible Catheter size: 19 Gauge Catheter at skin depth: 11 cm Test dose: negative  Assessment Sensory level: T10 Events: blood not aspirated, injection not painful, no injection resistance, no paresthesia and negative IV test  Additional Notes 3 attempt Pt. Evaluated and documentation done after procedure finished. Patient identified. Risks/Benefits/Options discussed with patient including but not limited to bleeding, infection, nerve damage, paralysis, failed block, incomplete pain control, headache, blood pressure changes, nausea, vomiting, reactions to medication both or allergic, itching and postpartum back pain. Confirmed with bedside nurse the patient's most recent platelet count. Confirmed with patient that they are not currently taking any anticoagulation, have any bleeding history or any family history of bleeding disorders. Patient expressed understanding and wished to proceed. All questions were answered. Sterile technique was used throughout the entire procedure. Please see nursing notes for vital signs. CSE performed.Warning signs of high block  given to the patient including shortness of breath, tingling/numbness in hands, complete motor block, or any concerning symptoms with instructions to call for help. Patient was given instructions on fall risk and not to get out of bed. All questions and concerns addressed with instructions to call with any issues or inadequate analgesia.    Patient tolerated the insertion well without immediate complications. CSF return noted and patient had immediate headache develop. Blood pressure was stable and patient had short-duration chest pain. The headache improved significantly with laying down. She was informed of the dural puncture and was informed that we would follow her care and provide medication recommendations. Reason for block:procedure for pain

## 2020-12-03 NOTE — H&P (Signed)
History and Physical Interval Note:  12/03/2020 9:54 AM  Yves Dill  has presented today for Delivery.  The plan is to attempt an ECV as patient is breech presentation.  If not successful, then Cesarean Section.  If successful, then immediate INDUCTION OF LABOR (pitocin, amniotomy),  with the diagnosis of A2 DM and chronic hypertension . The various methods of treatment have been discussed with the patient and family. After consideration of risks, benefits and other options for treatment, the patient has consented to  ECV, Labor induction, and even Cesarean Section.  The patient's history has been reviewed, patient examined, no change in status, and is stable for induction as planned.  See H&P. I have reviewed the patient's chart and labs.  Questions were answered to the patient's satisfaction.    BS normal (85) BP normal on admission. [place] confirms breech.  She is no longer polyhydramnios.  Discussed plan for epidural, Terbutaline, and ECV trial.  Annamarie Major, MD, [facility], [facility] 12/03/2020  9:54 AM

## 2020-12-03 NOTE — Discharge Summary (Signed)
Postpartum Discharge Summary  Date of Service updated7/30/2022     Patient Name: Tami Foster DOB: 06-Jun-1980 MRN: [id number]  Date of admission: 12/03/2020 Delivery date:12/03/2020  Delivering provider: Gae Dry  Date of discharge: 12/05/2020  Admitting diagnosis: Encounter for induction of labor [Z34.90] Intrauterine pregnancy: [redacted]w[redacted]d    Secondary diagnosis:  Principal Problem:   Delivery by cesarean section for breech presentation Active Problems:   Hypertension affecting pregnancy   Insulin controlled gestational diabetes mellitus (GDM) during pregnancy, antepartum   Encounter for induction of labor   [redacted] weeks gestation of pregnancy  Additional problems: Postpartum depression/anxiety    Discharge diagnosis: Term Pregnancy Delivered, CHTN, and GDM A2                                              Post partum procedures: none Augmentation: N/A Complications: None  Hospital course: Sceduled C/S   40y.o. yo [id number] admitted to the hospital 12/03/2020 for ECV due to breech presentation at 37 weeks w delivery desired and planned due to cHSanta PaulaECV was not successful so we performed cesarean section with the following indication:Malpresentation.Delivery details are as follows:  Membrane Rupture Time/Date:  ,   Delivery Method:C-Section, Low Transverse  Details of operation can be found in separate operative note.  Patient had an uncomplicated postpartum course.  She is ambulating, tolerating a regular diet, passing flatus, and urinating well. Patient is discharged home in stable condition on  12/05/20        Newborn Data: Birth date:12/03/2020  Birth time:12:59 PM  Gender:Female  Living status:Living  Apgars:8 ,10  Weight:3030 g     Magnesium Sulfate received: No BMZ received: Yes Rhophylac:No MMR:No T-DaP:Given prenatally Flu: No Transfusion:No  Physical exam  Vitals:   12/04/20 0752 12/04/20 1626 12/05/20 0000 12/05/20 0837  BP: 113/75  128/79 137/90 123/70  Pulse: 84 80 79 68  Resp:  '18 18 18  ' Temp: 97.8 F (36.6 C) 98.6 F (37 C) 98 F (36.7 C) 98.4 F (36.9 C)  TempSrc: Oral Oral Oral Oral  SpO2: 97%  100% 99%   General: alert and cooperative Lochia: appropriate Uterine Fundus: firm Incision: Healing well with no significant drainage DVT Evaluation: No evidence of DVT seen on physical exam. Labs: Lab Results  Component Value Date   WBC 20.8 (H) 12/04/2020   HGB 11.4 (L) 12/04/2020   HCT 33.7 (L) 12/04/2020   MCV 96.6 12/04/2020   PLT 170 12/04/2020   CMP Latest Ref Rng & Units 10/16/2020  Glucose 65 - 99 mg/dL 78  BUN 6 - 20 mg/dL 5(L)  Creatinine 0.57 - 1.00 mg/dL 0.41(L)  Sodium 134 - 144 mmol/L 137  Potassium 3.5 - 5.2 mmol/L 3.9  Chloride 96 - 106 mmol/L 105  CO2 20 - 29 mmol/L 17(L)  Calcium 8.7 - 10.2 mg/dL 8.9  Total Protein 6.0 - 8.5 g/dL 6.2  Total Bilirubin 0.0 - 1.2 mg/dL 0.2  Alkaline Phos 44 - 121 IU/L 113  AST 0 - 40 IU/L 15  ALT 0 - 32 IU/L 18   Edinburgh Score: Edinburgh Postnatal Depression Scale Screening Tool 12/04/2020  I have been able to laugh and see the funny side of things. 1  I have looked forward with enjoyment to things. 1  I have blamed myself unnecessarily when things went  wrong. 1  I have been anxious or worried for no good reason. 2  I have felt scared or panicky for no good reason. 3  Things have been getting on top of me. 2  I have been so unhappy that I have had difficulty sleeping. 1  I have felt sad or miserable. 2  I have been so unhappy that I have been crying. 2  The thought of harming myself has occurred to me. 0  Edinburgh Postnatal Depression Scale Total 15      After visit meds:  Allergies as of 12/05/2020   No Known Allergies      Medication List     TAKE these medications    albuterol 108 (90 Base) MCG/ACT inhaler Commonly known as: VENTOLIN HFA Inhale 2 puffs into the lungs every 4 (four) hours as needed for wheezing or shortness of  breath (cough, shortness of breath or wheezing.).   ALPRAZolam 1 MG tablet Commonly known as: XANAX Take 1 mg by mouth daily as needed for anxiety. What changed: Another medication with the same name was added. Make sure you understand how and when to take each.   ALPRAZolam 1 MG tablet Commonly known as: Xanax Take 1 tablet (1 mg total) by mouth at bedtime as needed for sleep. What changed: You were already taking a medication with the same name, and this prescription was added. Make sure you understand how and when to take each.   Butalbital-APAP-Caffeine 50-325-40 MG capsule TAKE 1-2 CAPSULES BY MOUTH EVERY 6 (SIX) HOURS AS NEEDED FOR HEADACHE.   ibuprofen 600 MG tablet Commonly known as: ADVIL Take 1 tablet (600 mg total) by mouth every 6 (six) hours.   metoCLOPramide 10 MG tablet Commonly known as: REGLAN Take 1 tablet (10 mg total) by mouth 3 (three) times daily before meals.   oxyCODONE 5 MG immediate release tablet Commonly known as: Oxy IR/ROXICODONE Take 1-2 tablets (5-10 mg total) by mouth every 6 (six) hours as needed for moderate pain.   phenylephrine-shark liver oil-mineral oil-petrolatum 0.25-14-74.9 % rectal ointment Commonly known as: PREPARATION H Place 1 application rectally 2 (two) times daily as needed for hemorrhoids.   PRENATAL PO Take 1 tablet by mouth daily.   Proctofoam HC rectal foam Generic drug: hydrocortisone-pramoxine Place 1 applicator rectally 2 (two) times daily. What changed:  when to take this reasons to take this   simethicone 80 MG chewable tablet Commonly known as: Gas-X Chew 1 tablet (80 mg total) by mouth 4 (four) times daily as needed for flatulence.   venlafaxine XR 75 MG 24 hr capsule Commonly known as: Effexor XR Take 1 capsule (75 mg total) by mouth daily for 4 days, THEN 2 capsules (150 mg total) daily. Start taking on: December 05, 2020               Discharge Care Instructions  (From admission, onward)            Start     Ordered   12/05/20 0000  Discharge wound care:       Comments: SHOWER DAILY Wash incision gently with soap and water.  Call office with any drainage, redness, or firmness of the incision.   12/05/20 1045             Discharge home in stable condition Infant Feeding: Bottle and Breast Infant Disposition:home with mother Discharge instruction: per After Visit Summary and Postpartum booklet. Activity: Advance as tolerated. Pelvic rest for 6 weeks.  Diet: routine  diet Anticipated Birth Control:  planning vasectomy or nuvaring Postpartum Appointment:6 weeks Additional Postpartum F/U: Incision/depression check 1 week Future Appointments: No future appointments.  Follow up Visit:  Follow-up Information     Gae Dry, MD. Schedule an appointment as soon as possible for a visit in 1 week(s).   Specialty: Obstetrics and Gynecology Why: For Post Op Contact information: [address] [place] [address] [phone]                     12/05/2020 Homero Fellers, MD

## 2020-12-03 NOTE — Plan of Care (Signed)
Complete

## 2020-12-03 NOTE — Op Note (Signed)
Cesarean Section Procedure Note Indications: malpresentation: breech and term intrauterine pregnancy  Pre-operative Diagnosis:  malpresentation: chronic hypertension, gestational diabetes (A2), failed external cephalic version, and term intrauterine pregnancy at 37 weeks Post-operative Diagnosis: same, delivered. Procedure: Low Transverse Cesarean Section Surgeon: Annamarie Major, MD, FACOG Assistant(s): Dr Bjorn Pippin, No other capable assistant available, in surgery requiring high level assistant. Anesthesia: Epidural anesthesia Estimated Blood Loss:700 mL Complications: None; patient tolerated the procedure well. Disposition: PACU - hemodynamically stable. Condition: stable  Findings: A female infant in the breech footling presentation. "Tami Foster" Amniotic fluid - Clear  Birth weight 6 lb 11 oz.  Apgars of 8 and 10.  Intact placenta with a three-vessel cord. Grossly normal uterus, tubes and ovaries bilaterally. No intraabdominal adhesions were noted.  Procedure Details   The patient was taken to Operating Room, identified as the correct patient and the procedure verified as C-Section Delivery. A Time Out was held and the above information confirmed. After induction of anesthesia, the patient was draped and prepped in the usual sterile manner. A Pfannenstiel incision was made and carried down through the subcutaneous tissue to the fascia. Fascial incision was made and extended transversely with the Mayo scissors. The fascia was separated from the underlying rectus tissue superiorly and inferiorly. The peritoneum was identified and entered bluntly. Peritoneal incision was extended longitudinally. The utero-vesical peritoneal reflection was incised transversely and a bladder flap was created digitally.  A low transverse hysterotomy was made. The fetus was delivered atraumatically. The umbilical cord was clamped x2 and cut and the infant was handed to the awaiting pediatricians. The placenta was  removed intact and appeared normal with a 3-vessel cord.  The uterus was exteriorized and cleared of all clot and debris. The hysterotomy was closed with running sutures of 0 Vicryl suture. A second imbricating layer was placed with the same suture. Excellent hemostasis was observed. The uterus was returned to the abdomen. The pelvis was irrigated and again, excellent hemostasis was noted.  The On Q Pain pump System was then placed.  Trocars were placed through the abdominal wall into the subfascial space and these were used to thread the silver soaker cathaters into place.The rectus fascia was then reapproximated with running sutures of Maxon, with careful placement not to incorporate the cathaters. Subcutaneous tissues are then irrigated with saline and hemostasis assured.  Skin is then closed with 4-0 vicryl suture in a subcuticular fashion followed by skin adhesive. The surgical assistant performed tissue retraction, assistance with suturing, and fundal pressure.   The cathaters are flushed each with 5 mL of Bupivicaine and stabilized into place with dressing. Instrument, sponge, and needle counts were correct prior to the abdominal closure and at the conclusion of the case.  The patient tolerated the procedure well and was transferred to the recovery room in stable condition.   Annamarie Major, MD, [facility], [facility] 12/03/2020  1:39 PM

## 2020-12-03 NOTE — Procedures (Signed)
ECV  After informed consent, including reviewing the potential risks of pain, failure to turn fetus,  rupture of membranes, uterine abruption, bleeding and fetal death.  Patient opted to proceed with the procedure.  Ultrasound at bedside confirms breech presentation. Epidural placed Terbutaline 0.25 mg SQ was given  ECV was attempted under Ultrasound guidance.   The version was not successful after 2 attempts.  FHR was 130 and reactive before and after the procedure.  Toco showed mild uterine irritability.  There was a fetal heart rate drop right after procedure and this returned to baseline of 120 sec. Pt. Tolerated the procedure well.  There were no complications. Plan is CS now for breech, as delivery is indicated due to diabetes and hypertension at 37 weeks.  Annamarie Major, MD, [facility], [facility] 12/03/2020  12:26 PM

## 2020-12-04 ENCOUNTER — Encounter: Payer: Medicaid Other | Admitting: Obstetrics and Gynecology

## 2020-12-04 ENCOUNTER — Encounter: Payer: Self-pay | Admitting: Obstetrics & Gynecology

## 2020-12-04 LAB — CBC
HCT: 33.7 % — ABNORMAL LOW (ref 36.0–46.0)
Hemoglobin: 11.4 g/dL — ABNORMAL LOW (ref 12.0–15.0)
MCH: 32.7 pg (ref 26.0–34.0)
MCHC: 33.8 g/dL (ref 30.0–36.0)
MCV: 96.6 fL (ref 80.0–100.0)
Platelets: 170 10*3/uL (ref 150–400)
RBC: 3.49 MIL/uL — ABNORMAL LOW (ref 3.87–5.11)
RDW: 15.1 % (ref 11.5–15.5)
WBC: 20.8 10*3/uL — ABNORMAL HIGH (ref 4.0–10.5)
nRBC: 0 % (ref 0.0–0.2)

## 2020-12-04 MED ORDER — SEVOFLURANE IN SOLN
RESPIRATORY_TRACT | Status: AC
  Filled 2020-12-04: qty 250

## 2020-12-04 MED ORDER — LACTATED RINGERS IV BOLUS
500.0000 mL | Freq: Once | INTRAVENOUS | Status: AC
  Administered 2020-12-04: 500 mL via INTRAVENOUS

## 2020-12-04 NOTE — Progress Notes (Signed)
Patient ambulating in hall

## 2020-12-04 NOTE — Anesthesia Postprocedure Evaluation (Signed)
Anesthesia Post Note  Patient: Kiasia Chou  Procedure(s) Performed: AN AD HOC LABOR EPIDURAL  Patient location during evaluation: Mother Baby Anesthesia Type: Epidural Level of consciousness: awake and alert Pain management: pain level not controlled (Pt on tylenol, NSAID, oxycodone, and incisional local anesthetic infusion. She will continue the regimen and talk with OB provider about possible lidocaine patch) Vital Signs Assessment: post-procedure vital signs reviewed and stable Respiratory status: spontaneous breathing, nonlabored ventilation and respiratory function stable Cardiovascular status: stable Postop Assessment: no headache, no backache, able to ambulate, adequate PO intake and patient able to bend at knees Anesthetic complications: no Comments: Pt's headache resolved. She was instructed to call L&D if discharged if she was to develop a headache.    No notable events documented.   Last Vitals:  Vitals:   12/04/20 0600 12/04/20 0752  BP:  113/75  Pulse: 74 84  Resp:    Temp:  36.6 C  SpO2: 97% 97%    Last Pain:  Vitals:   12/04/20 0752  TempSrc: Oral  PainSc:                  Foye Deer

## 2020-12-04 NOTE — Lactation Note (Addendum)
This note was copied from a baby's chart. Lactation Consultation Note  Patient Name: Tami Foster [id number] Date: 12/04/2020 Reason for consult: Initial assessment;Late-preterm 34-36.6wks Age:38 hours  Maternal Data Does the patient have breastfeeding experience prior to this delivery?: Yes How long did the patient breastfeed?: 1.5 mths approx Mom pumping breasts with symphony breast pump, obtained approx 1 cc colostrum  Feeding Mother's Current Feeding Choice: Breast Milk and Formula Nipple Type: Slow - flow Baby sleeping in bassinet , had been fed formula recently, mom states she can latch baby to right breast but can't latch to left breast.  I encouraged mom to call me when she wanted to attempt left breast and I would assist her with latching  LATCH Score Latch:  (did not observe a feeding)                  Lactation Tools Discussed/Used Tools: Pump Breast pump type: Double-Electric Breast Pump Reason for Pumping: to stimulate breast milk production Pumping frequency: prn Pumped volume: 1 mL (drops from left, approx 1cc from right)  Interventions Interventions: Coconut oil;DEBP;Education LC name and no written on white board Discharge Pump: DEBP  Consult Status Consult Status: PRN    Dyann Kief 12/04/2020, 1:51 PM

## 2020-12-04 NOTE — Progress Notes (Signed)
Patient's output was inadequate throughout the night. Pt was encouraged to increase fluids. LR at 148mL/hr was given. Pt was still inadequate at the next measurement. Pt wanted to stand at the bedside. Pt was able to stand and take a few steps with no complaint of dizziness. Spoke with Dr. Tiburcio Pea and gave patient a bolus of LR. Upon next assessment pt was adequate and foley was removed.

## 2020-12-04 NOTE — Anesthesia Postprocedure Evaluation (Signed)
Anesthesia Post Note  Patient: Tami Foster  Procedure(s) Performed: AN AD HOC LABOR EPIDURAL  Patient location during evaluation: Mother Baby Anesthesia Type: Epidural Level of consciousness: awake and alert Pain management: pain level controlled Vital Signs Assessment: post-procedure vital signs reviewed and stable Respiratory status: spontaneous breathing, nonlabored ventilation and respiratory function stable Cardiovascular status: stable Postop Assessment: no headache, no backache and able to ambulate Anesthetic complications: no Comments: Pt reports able to ambulate and change positions without headache.    No notable events documented.   Last Vitals:  Vitals:   12/04/20 0500 12/04/20 0600  BP:    Pulse: 79 74  Resp:    Temp:    SpO2: 98% 97%    Last Pain:  Vitals:   12/04/20 0620  TempSrc:   PainSc: 4                  Rosanne Gutting

## 2020-12-04 NOTE — Progress Notes (Signed)
Obstetric Postpartum/PostOperative Daily Progress Note Subjective:  40 y.o. [id number] post-operative day # 1 status post primary cesarean delivery.  She is ambulating, is tolerating po, is voiding spontaneously.  Her pain is well controlled on PO pain medications. Her lochia is less than menses. She reports baby having some issues with latch and is also supplementing with formula.   Medications SCHEDULED MEDICATIONS   ketorolac  30 mg Intravenous Q6H   Followed by   ibuprofen  600 mg Oral Q6H   prenatal multivitamin  1 tablet Oral Q1200   scopolamine  1 patch Transdermal Once   senna-docusate  2 tablet Oral Q24H   simethicone  80 mg Oral TID PC    MEDICATION INFUSIONS   bupivacaine 0.25 % ON-Q pump DUAL CATH 400 mL     bupivacaine ON-Q pain pump     lactated ringers Stopped (12/04/20 0212)   naLOXone (NARCAN) adult infusion for PRURITIS      PRN MEDICATIONS  acetaminophen, coconut oil, witch hazel-glycerin **AND** dibucaine, diphenhydrAMINE, diphenhydrAMINE **OR** diphenhydrAMINE, ketorolac **OR** ketorolac, menthol-cetylpyridinium, morphine injection, nalbuphine **OR** nalbuphine, nalbuphine **OR** nalbuphine, naloxone **AND** sodium chloride flush, naLOXone (NARCAN) adult infusion for PRURITIS, ondansetron (ZOFRAN) IV, oxyCODONE, simethicone, zolpidem    Objective:   Vitals:   12/04/20 0400 12/04/20 0500 12/04/20 0600 12/04/20 0752  BP:    113/75  Pulse: 72 79 74 84  Resp:      Temp:    97.8 F (36.6 C)  TempSrc:    Oral  SpO2: 99% 98% 97% 97%    Current Vital Signs 24h Vital Sign Ranges  T 97.8 F (36.6 C) Temp  Avg: 98.1 F (36.7 C)  Min: 97.8 F (36.6 C)  Max: 98.5 F (36.9 C)  BP 113/75 BP  Min: 102/65  Max: 126/73  HR 84 Pulse  Avg: 81.6  Min: 71  Max: 96  RR 18 Resp  Avg: 17.6  Min: 16  Max: 18  SaO2 97 %   SpO2  Avg: 97.9 %  Min: 97 %  Max: 100 %       24 Hour I/O Current Shift I/O  Time Ins Outs 07/28 0701 - 07/29 0700 In: 3555.5 [I.V.:2955.5] Out: 2965  [Urine:1525] 07/29 0701 - 07/29 1900 In: -  Out: 300 [Urine:300]   General: NAD Pulmonary: no increased work of breathing Abdomen: non-distended, non-tender, fundus firm at level of umbilicus Inc: Clean/dry/intact Extremities: no edema, no erythema, no tenderness  Labs:  Recent Labs  Lab 12/02/20 0846 12/04/20 0430  WBC 13.4* 20.8*  HGB 13.6 11.4*  HCT 40.2 33.7*  PLT 234 170     Assessment:   39 y.o. [id number] postoperative day # 1 status post primary cesarean section  Plan:  1) Acute blood loss anemia - hemodynamically stable and asymptomatic - po ferrous sulfate  2) B POS / Rubella 9.51 (01/20 1627)/ Varicella Immune  3) TDAP status Up to date  4) Breast and formula feeding/Contraception =  considering interval tubal vs vasectomy  5) Disposition: continue current care   Tresea Mall, CNM 12/04/2020 10:38 AM

## 2020-12-05 LAB — GLUCOSE, CAPILLARY: Glucose-Capillary: 155 mg/dL — ABNORMAL HIGH (ref 70–99)

## 2020-12-05 MED ORDER — VENLAFAXINE HCL ER 75 MG PO CP24: ORAL_CAPSULE | ORAL | 2 refills | Status: DC

## 2020-12-05 MED ORDER — OXYCODONE HCL 5 MG PO TABS: 5.0000 mg | ORAL_TABLET | Freq: Four times a day (QID) | ORAL | 0 refills | Status: DC | PRN

## 2020-12-05 MED ORDER — ALPRAZOLAM 1 MG PO TABS: 1.0000 mg | ORAL_TABLET | Freq: Every evening | ORAL | 0 refills | Status: DC | PRN

## 2020-12-05 MED ORDER — IBUPROFEN 600 MG PO TABS: 600.0000 mg | ORAL_TABLET | Freq: Four times a day (QID) | ORAL | 0 refills | Status: DC

## 2020-12-05 MED ORDER — SIMETHICONE 80 MG PO CHEW: 80.0000 mg | CHEWABLE_TABLET | Freq: Four times a day (QID) | ORAL | 2 refills | Status: DC | PRN

## 2020-12-05 MED ORDER — METOCLOPRAMIDE HCL 10 MG PO TABS: 10.0000 mg | ORAL_TABLET | Freq: Three times a day (TID) | ORAL | 0 refills | Status: DC

## 2020-12-05 NOTE — TOC Initial Note (Signed)
Transition of Care [facility]) - Initial/Assessment Note    Patient Details  Name: Tami Foster MRN: [id number] Date of Birth: 26-Dec-1980  [facility] CM/SW Contact:    Gildardo Griffes, LCSW Phone Number: 12/05/2020, 12:16 PM  Clinical Narrative:                  CSW consulted for patient score of 15 on depression scale.   CSW spoke with patient about discharge today, patient reports feeling in some pain however it is well controlled. CSW inquired as to hx of anxiety and depression, patient confirms. Reports this is the second child she had birthed, does have history of depression with first child however reports this process was different. Reports eager to get home and get into routine. Patient confirms hx of xanax and effexor, reports she will resume at dc which was found to be effective in the past in managing her anxiety and depression. Patient reports her husband is a support for her and she feels comfortable talking to him about her feelings, as she currently has been feeling overwhelmed. Patient identified coping skills in that past of reading and painting, reports she has not painted in a while and may try to resume some when she gets home. CSW encouraged balance of self care while tending to children. Patient reports she was in therapy in the past and may resume once she gets to be feeling better physically, reports she has her therapists contact information to reach out if needed. Reports no SI/HI at this time and feels ready to go home.   CSW encouraged patient to reach out should she identify any dc needs, however appears to be well supported with access and knowledge of resources should she need.    Expected Discharge Plan: Home/Self Care Barriers to Discharge: No Barriers Identified   Patient Goals and CMS Choice   CMS Medicare.gov Compare Post Acute Care list provided to:: Patient Choice offered to / list presented to : Patient  Expected Discharge Plan and  Services Expected Discharge Plan: Home/Self Care       Living arrangements for the past 2 months: Single Family Home Expected Discharge Date: 12/05/20                                    Prior Living Arrangements/Services Living arrangements for the past 2 months: Single Family Home Lives with:: Spouse, Minor Children                   Activities of Daily Living Home Assistive Devices/Equipment: None ADL Screening (condition at time of admission) Patient's cognitive ability adequate to safely complete daily activities?: Yes Is the patient deaf or have difficulty hearing?: No Does the patient have difficulty seeing, even when wearing glasses/contacts?: No Does the patient have difficulty concentrating, remembering, or making decisions?: No Patient able to express need for assistance with ADLs?: Yes Does the patient have difficulty dressing or bathing?: No Independently performs ADLs?: Yes (appropriate for developmental age) Does the patient have difficulty walking or climbing stairs?: No Weakness of Legs: None Weakness of Arms/Hands: None  Permission Sought/Granted                  Emotional Assessment   Attitude/Demeanor/Rapport: Gracious Affect (typically observed): Calm Orientation: : Oriented to Self, Oriented to Place, Oriented to  Time, Oriented to Situation Alcohol / Substance Use: Not Applicable Psych Involvement: No (  comment)  Admission diagnosis:  Encounter for induction of labor [Z34.90] Patient Active Problem List   Diagnosis Date Noted   Encounter for induction of labor 12/03/2020   Delivery by cesarean section for breech presentation    [redacted] weeks gestation of pregnancy    History of preterm delivery 11/13/2020   Insulin controlled gestational diabetes mellitus (GDM) during pregnancy, antepartum 10/16/2020   H/O LEEP 06/11/2020   Hypertension affecting pregnancy 05/28/2020   Tachycardia 05/28/2020   Multigravida of advanced maternal age  21/20/2022   Supervision of high risk pregnancy, antepartum 05/28/2020   Adjustment disorder with mixed anxiety and depressed mood 10/08/2017   Obesity, Class I, BMI 30-34.9 10/08/2017   Chronic fatigue 10/08/2017   History of female hirsutism 11/11/2015   ASCUS favor benign 11/11/2015   Hirsutism 08/05/2014   Oligomenorrhea 08/05/2014   PCOS (polycystic ovarian syndrome) 08/05/2014   Smoker 08/05/2014   Anxiety 05/06/2013   PCP:  Emi Belfast, FNP Pharmacy:   [facility] [phone] - [place], [place] - [address] AT [facility] [address] [place] [address] Phone: [phone] Fax: [phone]     Social Determinants of Health (SDOH) Interventions    Readmission Risk Interventions No flowsheet data found.

## 2020-12-05 NOTE — Plan of Care (Signed)
Patient appropriate for discharge.

## 2020-12-05 NOTE — Plan of Care (Signed)
Patient walked out in hall independently around nurses station several times, a couple of different times so far this shift. Patient states she "finally passed a little gas". Patient stated she has "not slept in 2 days". Patient emotional and teary this shift and nurse has listened to , comforted, and encouraged patient several times; nurse also encouraged patient to try to get some sleep. At the beginning of the shift nurse showed patient her meds on the [facility] and discussed all the meds with her (scheduled and prn) because patient said she didn't understand her pain meds. Prn meds wriiten on white board and the times next available. Patient thanked nurse for educating her (going over) her meds.

## 2020-12-05 NOTE — Progress Notes (Signed)
  Subjective:   She reports that she has been having gas pain, but is passing flatus and has noticed an improvement. She is feeling depressed and anxious and wants to be discharged because she reports she will feel better once she is home. She has struggled with anxiety and depression previously and is having renewed problems after her delivery. She took effexor and xanax in the past with much improvement and would like to restart that regimen. She has been struggling to breastfeed and is incorporating formula.   Objective:  Blood pressure 123/70, pulse 68, temperature 98.4 F (36.9 C), temperature source Oral, resp. rate 18, last menstrual period 03/16/2020, SpO2 99 %, unknown if currently breastfeeding.  General: NAD Pulmonary: no increased work of breathing Abdomen: non-distended, non-tender, fundus firm at level of umbilicus Incision: clean, dry intact. Extremities: no edema, no erythema, no tenderness  Results for orders placed or performed during the hospital encounter of 12/03/20 (from the past 72 hour(s))  Glucose, capillary     Status: None   Collection Time: 12/03/20  9:16 AM  Result Value Ref Range   Glucose-Capillary 85 70 - 99 mg/dL    Comment: Glucose reference range applies only to samples taken after fasting for at least 8 hours.  CBC     Status: Abnormal   Collection Time: 12/04/20  4:30 AM  Result Value Ref Range   WBC 20.8 (H) 4.0 - 10.5 K/uL   RBC 3.49 (L) 3.87 - 5.11 MIL/uL   Hemoglobin 11.4 (L) 12.0 - 15.0 g/dL   HCT 16.3 (L) 84.6 - 65.9 %   MCV 96.6 80.0 - 100.0 fL   MCH 32.7 26.0 - 34.0 pg   MCHC 33.8 30.0 - 36.0 g/dL   RDW 93.5 70.1 - 77.9 %   Platelets 170 150 - 400 K/uL   nRBC 0.0 0.0 - 0.2 %    Comment: Performed at [facility], [address], [place], [place] [address]     Assessment:   40 y.o. [id number] postoperativeday # 2   Plan:  1) Acute blood loss anemia - hemodynamically stable and asymptomatic - po ferrous sulfate  2) Blood  Type --/--/B POS (07/27 0846)   3) Rubella 9.51 (01/20 1627) / Varicella immune  4) TDAP status - up to date Immunization History  Administered Date(s) Administered   Tdap 07/20/2016, 10/16/2020    5) Feeding- breast and bottle   6) Contraception - vasectomy or nuvaring planned  7) Postpartum depression noted. Will restart effexor and xanax, rx sent. Discussed zulresso infusion as well. She would like to consider this medication.   8) Discussed bowel regimen with reglan and mylicon  9) Gestational diabetes, will check post prandial glucose after breakfast today.  10) Disposition - home today  Adelene Idler MD, [facility], [facility] 12/05/2020 10:52 AM

## 2020-12-07 ENCOUNTER — Telehealth: Payer: Self-pay

## 2020-12-07 NOTE — Addendum Note (Signed)
Addendum  created 12/07/20 1127 by Foye Deer, MD   Review and Sign - Ready for Procedure, Review and Sign - Undo

## 2020-12-07 NOTE — Interval H&P Note (Signed)
History and Physical Interval Note:  12/07/2020 10:02 AM  Tami Foster  has presented today for procedure, with the diagnosis of breech presentation.  The various methods of treatment have been discussed with the patient and family. After consideration of risks, benefits and other options for treatment, the patient has consented to  ECV.  The patient's history has been reviewed, patient examined, no change in status, stable for procedure.  I have reviewed the patient's chart and labs.  Questions were answered to the patient's satisfaction.     [name]

## 2020-12-07 NOTE — Telephone Encounter (Signed)
Transition Care Management Unsuccessful Follow-up Telephone Call  Date of discharge and from where:  12/05/2020-[facility]  Attempts:  1st Attempt  Reason for unsuccessful TCM follow-up call:  Left voice message    

## 2020-12-08 NOTE — Telephone Encounter (Signed)
Transition Care Management Follow-up Telephone Call Date of discharge and from where: 12/05/2020-[facility] How have you been since you were released from the hospital? Patient stated she is doing ok.  Any questions or concerns? No  Items Reviewed: Did the pt receive and understand the discharge instructions provided? Yes  Medications obtained and verified? Yes  Other? No  Any new allergies since your discharge? No  Dietary orders reviewed? No Do you have support at home? Yes   Home Care and Equipment/Supplies: Were home health services ordered? not applicable If so, what is the name of the agency? N/A  Has the agency set up a time to come to the patient's home? not applicable Were any new equipment or medical supplies ordered?  No What is the name of the medical supply agency? N/A Were you able to get the supplies/equipment? not applicable Do you have any questions related to the use of the equipment or supplies? No  Functional Questionnaire: (I = Independent and D = Dependent) ADLs: I  Bathing/Dressing- I  Meal Prep- I  Eating- I  Maintaining continence- I  Transferring/Ambulation- I  Managing Meds- I  Follow up appointments reviewed:  PCP Hospital f/u appt confirmed? No   Specialist Hospital f/u appt confirmed? Yes  Scheduled to see Dr. Tiburcio Pea on 12/09/2020 @ 8:50 am. Are transportation arrangements needed? No  If their condition worsens, is the pt aware to call PCP or go to the Emergency Dept.? Yes Was the patient provided with contact information for the PCP's office or ED? Yes Was to pt encouraged to call back with questions or concerns? Yes

## 2020-12-09 ENCOUNTER — Ambulatory Visit (INDEPENDENT_AMBULATORY_CARE_PROVIDER_SITE_OTHER): Payer: Medicaid Other | Admitting: Obstetrics & Gynecology

## 2020-12-09 ENCOUNTER — Encounter: Payer: Self-pay | Admitting: Obstetrics & Gynecology

## 2020-12-09 ENCOUNTER — Other Ambulatory Visit: Payer: Self-pay

## 2020-12-09 VITALS — BP 140/80 | Ht 62.0 in | Wt 163.0 lb

## 2020-12-09 DIAGNOSIS — Z98891 History of uterine scar from previous surgery: Secondary | ICD-10-CM | POA: Insufficient documentation

## 2020-12-09 DIAGNOSIS — O24414 Gestational diabetes mellitus in pregnancy, insulin controlled: Secondary | ICD-10-CM

## 2020-12-09 MED ORDER — BUTALBITAL-APAP-CAFFEINE 50-325-40 MG PO CAPS: 1.0000 | ORAL_CAPSULE | Freq: Four times a day (QID) | ORAL | 2 refills | Status: DC | PRN

## 2020-12-09 MED ORDER — BUTALBITAL-APAP-CAFFEINE 50-325-40 MG PO CAPS: 1.0000 | ORAL_CAPSULE | Freq: Four times a day (QID) | ORAL | 3 refills | Status: DC | PRN

## 2020-12-09 NOTE — Addendum Note (Signed)
Addended by: Nadara Mustard on: 12/09/2020 11:35 AM   Modules accepted: Orders

## 2020-12-09 NOTE — Progress Notes (Signed)
  Postoperative Follow-up Patient presents post op from recent Cesarean Section performed for Malpresentation breech, 1 week ago.   Subjective: Patient reports marked improvement in her immediate post op symptoms. Eating a regular diet without difficulty. Pain is controlled with current analgesics. Medications being used: ibuprofen (OTC).  Activity: normal activities of daily living. Patient reports additional symptom's since surgery of appropriate lochia, no signs of depression, and no signs of mastitis.  Objective: BP 140/80   Ht 5\' 2"  (1.575 m)   Wt 163 lb (73.9 kg)   LMP 03/16/2020 (Approximate)   BMI 29.81 kg/m  Physical Exam Constitutional:      General: She is not in acute distress.    Appearance: She is well-developed.  Cardiovascular:     Rate and Rhythm: Normal rate.  Pulmonary:     Effort: Pulmonary effort is normal.  Abdominal:     General: There is no distension.     Palpations: Abdomen is soft.     Tenderness: There is no abdominal tenderness.     Comments: Incision Healing Well   Musculoskeletal:        General: Normal range of motion.  Neurological:     Mental Status: She is alert and oriented to person, place, and time.     Cranial Nerves: No cranial nerve deficit.  Skin:    General: Skin is warm and dry.    Assessment: s/p : Cesarean Section for Malpresentation breech stable  Plan: Patient has done well after her Cesarean Section with no apparent complications.  I have discussed the post-operative course to date, and the expected progress moving forward.  The patient understands what complications to be concerned about.  I will see the patient in routine follow up, or sooner if needed.    Activity plan: No heavy lifting.13/12/2019  Pelvic rest. She desires oral progesterone-only contraceptive for postpartum contraception.  Discuss again next visit. Glucola nv Headache mgt discussed  Marland Kitchen 12/09/2020, 9:13 AM

## 2020-12-11 ENCOUNTER — Other Ambulatory Visit: Payer: Self-pay

## 2020-12-11 ENCOUNTER — Other Ambulatory Visit: Payer: Self-pay | Admitting: Obstetrics and Gynecology

## 2020-12-11 DIAGNOSIS — Z98891 History of uterine scar from previous surgery: Secondary | ICD-10-CM

## 2020-12-11 MED ORDER — OXYCODONE HCL 5 MG PO TABS: 5.0000 mg | ORAL_TABLET | ORAL | 0 refills | Status: DC | PRN

## 2020-12-11 NOTE — Progress Notes (Signed)
5 oxycodone prescriptions sent

## 2020-12-14 ENCOUNTER — Other Ambulatory Visit: Payer: Self-pay | Admitting: Obstetrics & Gynecology

## 2020-12-14 DIAGNOSIS — G971 Other reaction to spinal and lumbar puncture: Secondary | ICD-10-CM

## 2020-12-15 ENCOUNTER — Other Ambulatory Visit: Payer: Self-pay | Admitting: Obstetrics & Gynecology

## 2020-12-15 DIAGNOSIS — G971 Other reaction to spinal and lumbar puncture: Secondary | ICD-10-CM

## 2020-12-15 NOTE — Telephone Encounter (Signed)
OK, will place referral, cont to advise ER if not getting anywhere w scheduling and pain severe

## 2020-12-16 ENCOUNTER — Ambulatory Visit (HOSPITAL_BASED_OUTPATIENT_CLINIC_OR_DEPARTMENT_OTHER): Payer: Medicaid Other | Admitting: Student in an Organized Health Care Education/Training Program

## 2020-12-16 ENCOUNTER — Ambulatory Visit
Admission: RE | Admit: 2020-12-16 | Discharge: 2020-12-16 | Disposition: A | Discharge status: Home / Self Care | Payer: Medicaid Other | Source: Ambulatory Visit | Attending: Student in an Organized Health Care Education/Training Program | Admitting: Student in an Organized Health Care Education/Training Program

## 2020-12-16 ENCOUNTER — Other Ambulatory Visit: Payer: Self-pay

## 2020-12-16 ENCOUNTER — Encounter: Payer: Self-pay | Admitting: Student in an Organized Health Care Education/Training Program

## 2020-12-16 VITALS — BP 145/95 | HR 71 | Temp 97.2°F | Resp 18 | Ht 62.0 in | Wt 163.0 lb

## 2020-12-16 DIAGNOSIS — G971 Other reaction to spinal and lumbar puncture: Secondary | ICD-10-CM

## 2020-12-16 MED ORDER — LIDOCAINE HCL 2 % IJ SOLN
INTRAMUSCULAR | Status: AC
  Filled 2020-12-16: qty 10

## 2020-12-16 MED ORDER — LIDOCAINE HCL 2 % IJ SOLN
20.0000 mL | Freq: Once | INTRAMUSCULAR | Status: AC
  Administered 2020-12-16: 400 mg

## 2020-12-16 MED ORDER — CEFAZOLIN SODIUM 1 G IJ SOLR
INTRAMUSCULAR | Status: AC
  Filled 2020-12-16: qty 10

## 2020-12-16 NOTE — Progress Notes (Signed)
Safety precautions to be maintained throughout the outpatient stay will include: orient to surroundings, keep bed in low position, maintain call bell within reach at all times, provide assistance with transfer out of bed and ambulation.  

## 2020-12-16 NOTE — Progress Notes (Addendum)
PROVIDER NOTE: Information contained herein reflects review and annotations entered in association with encounter. Interpretation of such information and data should be left to medically-trained personnel. Information provided to patient can be located elsewhere in the medical record under "Patient Instructions". Document created using STT-dictation technology, any transcriptional errors that may result from process are unintentional.    Patient: Tami Foster  Service Category: Procedure  Provider: Edward Jolly, MD  DOB: [birth year]  DOS: 12/16/2020  Location: [facility]  MRN: [id number]  Setting: Ambulatory - outpatient  Referring Provider: Emi Belfast, FNP  Type: Established Patient  Specialty: Interventional Pain Management  PCP: Emi Belfast, FNP    Primary Reason for Visit: Interventional Pain Management Treatment. CC: Headache   Interventional Treatment          Procedure: Epidural blood patch Laterality: Midline Paramedial  Level: Lumbosacral   No./Series: Not applicable  Type: Epidural injection Purpose: Therapeutic  Region: Posterior Lumbosacral  Target Area: Posterior epidural space Approach: Percutaneous        Analgesic Technique:  Local Anesthesia only  Type: Local Anesthesia Indication(s): Analgesia         Local Anesthetic: Lidocaine 1-2% Route: Infiltration ([place]/IM) IV Access: Declined Sedation: Declined         Position / Prep / Materials:  Position: Prone   Prep solution: DuraPrep (Iodine Povacrylex [0.7% available iodine] and Isopropyl Alcohol, 74% w/w)   Materials:       Tray: Epidural      Needle(s):  Type:  Epidural   Gauge (G):  17   Length:  3.5   Qty:  1    Prep Area: Entire  Lower  Lumbar  Area          Indications: 1. Post-dural puncture headache     Tami Foster is a pleasant 40 year old female who presents with a positional headache related to postdural puncture after a C-section that was done on 12/03/2020.  Patient  states that multiple attempts were required for epidural placement.  She states that her anesthesia care team told her that they did spinal tap her.  She states that her headache has been very debilitating.  She denies any phonophobia, photophobia, nausea, vomiting, lower extremity weakness.  She is unable to take care of her child and pretty much has to lay supine to get any relief.  She has tried hydration, caffeine with limited response.  Today we discussed risks and benefits of lumbar epidural blood patch.  These were discussed in great detail.  Patient would like to proceed with lumbar epidural blood patch under fluoroscopy.  Pain Score: Pre-procedure: 7 /10 Post-procedure: 1  (sitting and standing)/10   Pre-op H&P Assessment:  Tami Foster is a 40 y.o. (year old), female patient, seen today for interventional treatment. She  has a past surgical history that includes Periurethral abscess drainage; LEEP; Wisdom tooth extraction; and Cesarean section (N/A, 12/03/2020). Tami Foster has a current medication list which includes the following prescription(s): alprazolam, butalbital-apap-caffeine, ibuprofen, venlafaxine xr, albuterol, alprazolam, proctofoam hc, metoclopramide, oxycodone, oxycodone, phenylephrine-shark liver oil-mineral oil-petrolatum, prenatal vit-fe fumarate-fa, simethicone, and [DISCONTINUED] lantus solostar. Her primarily concern today is the Headache  Initial Vital Signs:  Pulse/HCG Rate: 85  Temp: (!) 97.2 F (36.2 C) Resp: 18 BP: (!) 184/93 SpO2: 100 %  BMI: Estimated body mass index is 29.81 kg/m as calculated from the following:   Height as of this encounter: 5\' 2"  (1.575 m).   Weight as of this encounter: 163 lb (73.9 kg).  Risk Assessment: Allergies: Reviewed. She has No Known Allergies.  Allergy Precautions: None required Coagulopathies: Reviewed. None identified.  Blood-thinner therapy: None at this time Active Infection(s): Reviewed. None identified. Tami Foster  is afebrile  Site Confirmation: Tami Foster was asked to confirm the procedure and laterality before marking the site Procedure checklist: Completed Consent: Before the procedure and under the influence of no sedative(s), amnesic(s), or anxiolytics, the patient was informed of the treatment options, risks and possible complications. To fulfill our ethical and legal obligations, as recommended by the American Medical Association's Code of Ethics, I have informed the patient of my clinical impression; the nature and purpose of the treatment or procedure; the risks, benefits, and possible complications of the intervention; the alternatives, including doing nothing; the risk(s) and benefit(s) of the alternative treatment(s) or procedure(s); and the risk(s) and benefit(s) of doing nothing. The patient was provided information about the general risks and possible complications associated with the procedure. These may include, but are not limited to: failure to achieve desired goals, infection, bleeding, organ or nerve damage, allergic reactions, paralysis, and death. In addition, the patient was informed of those risks and complications associated to Spine-related procedures, such as failure to decrease pain; infection (i.e.: Meningitis, epidural or intraspinal abscess); bleeding (i.e.: epidural hematoma, subarachnoid hemorrhage, or any other type of intraspinal or peri-dural bleeding); organ or nerve damage (i.e.: Any type of peripheral nerve, nerve root, or spinal cord injury) with subsequent damage to sensory, motor, and/or autonomic systems, resulting in permanent pain, numbness, and/or weakness of one or several areas of the body; allergic reactions; (i.e.: anaphylactic reaction); and/or death. Furthermore, the patient was informed of those risks and complications associated with the medications. These include, but are not limited to: allergic reactions (i.e.: anaphylactic or anaphylactoid reaction(s));  adrenal axis suppression; blood sugar elevation that in diabetics may result in ketoacidosis or comma; water retention that in patients with history of congestive heart failure may result in shortness of breath, pulmonary edema, and decompensation with resultant heart failure; weight gain; swelling or edema; medication-induced neural toxicity; particulate matter embolism and blood vessel occlusion with resultant organ, and/or nervous system infarction; and/or aseptic necrosis of one or more joints. Finally, the patient was informed that Medicine is not an exact science; therefore, there is also the possibility of unforeseen or unpredictable risks and/or possible complications that may result in a catastrophic outcome. The patient indicated having understood very clearly. We have given the patient no guarantees and we have made no promises. Enough time was given to the patient to ask questions, all of which were answered to the patient's satisfaction. Ms. Tersigni has indicated that she wanted to continue with the procedure. Attestation: I, the ordering provider, attest that I have discussed with the patient the benefits, risks, side-effects, alternatives, likelihood of achieving goals, and potential problems during recovery for the procedure that I have provided informed consent. Date  Time: 12/16/2020  8:52 AM  Pre-Procedure Preparation:  Monitoring: As per clinic protocol. Respiration, ETCO2, SpO2, BP, heart rate and rhythm monitor placed and checked for adequate function Safety Precautions: Patient was assessed for positional comfort and pressure points before starting the procedure. Time-out: I initiated and conducted the "Time-out" before starting the procedure, as per protocol. The patient was asked to participate by confirming the accuracy of the "Time Out" information. Verification of the correct person, site, and procedure were performed and confirmed by me, the nursing staff, and the patient.  "Time-out" conducted as per Joint Commission's Universal  Protocol (UP.01.01.01). Time: 0957  Procedural Technique Safety Precautions: Aspiration looking for blood return was conducted prior to all injections. At no point did we inject any substances, as a needle was being advanced. No attempts were made at seeking any paresthesias. Safe injection practices and needle disposal techniques used. Medications properly checked for expiration dates. SDV (single dose vial) medications used. Description of the Procedure: Protocol guidelines were followed. The patient was assisted into a comfortable position. The target area was identified and the area prepped in the usual manner. Skin & deeper tissues infiltrated with local anesthetic. Appropriate amount of time allowed to pass for local anesthetics to take effect. The procedure needles were then advanced to the target area. Proper needle placement secured. Negative aspiration confirmed. Solution injected in intermittent fashion, asking for systemic symptoms every 3-4cc of injectate. The needles were then removed and the area cleansed, making sure to leave some of the prepping solution back to take advantage of its long term bactericidal properties   Vitals:   12/16/20 0955 12/16/20 1000 12/16/20 1005 12/16/20 1024  BP: (!) 192/101 (!) 184/96 (!) 188/97 (!) 145/95  Pulse: 67 77 78 71  Resp: 18 19 18 18   Temp:      SpO2: 100% 99% 100% 100%  Weight:      Height:         Start Time: 0957 hrs. End Time: 1004 hrs.  Materials:  Needle(s) Type: Spinal Needle Gauge: 22G Length: 3.5-in  Medication(s): Please see orders for medications and dosing details. Approximately 12 cc of heme was injected to the epidural blood space and 3 to 4 cc aliquots assessment patient for mental status changes, headaches, vision changes probably were injected. Imaging Guidance:          Type of Imaging Technique: Fluoroscopy Guidance (Spinal) Indication(s): Assistance in  needle guidance and placement for procedures requiring needle placement in or near specific anatomical locations not easily accessible without such assistance. Contrast used  Post-operative Assessment:  Post-procedure Vital Signs:  Pulse/HCG Rate: 71  Temp: (!) 97.2 F (36.2 C) Resp: 18 BP: (!) 145/95 SpO2: 100 %  EBL: None  Complications: No immediate post-treatment complications observed by team, or reported by patient.  Note: The patient tolerated the entire procedure well. A repeat set of vitals were taken after the procedure and the patient was kept under observation following institutional policy, for this type of procedure. Post-procedural neurological assessment was performed, showing return to baseline, prior to discharge. The patient was provided with post-procedure discharge instructions, including a section on how to identify potential problems. Should any problems arise concerning this procedure, the patient was given instructions to immediately contact , at any time, without hesitation. In any case, we plan to contact the patient by telephone for a follow-up status report regarding this interventional procedure.  Comments:  No additional relevant information.  Plan of Care  Orders:  Orders Placed This Encounter  Procedures   DG PAIN CLINIC C-ARM 1-60 MIN NO REPORT    Intraoperative interpretation by procedural physician at [facility].    Standing Status:   Standing    Number of Occurrences:   1    Order Specific Question:   Reason for exam:    Answer:   Assistance in needle guidance and placement for procedures requiring needle placement in or near specific anatomical locations not easily accessible without such assistance.    Patient standing up and ambulating.  States that her headache has decreased to a 1  out of 10.  5 out of 5 strength bilateral lower extremity: Plantar flexion, dorsiflexion, knee flexion, knee extension.   She will continue to  monitor her symptoms, follow-up with OB.  Medications ordered for procedure: Meds ordered this encounter  Medications   lidocaine (XYLOCAINE) 2 % (with pres) injection 400 mg   Medications administered: We administered lidocaine.  See the medical record for exact dosing, route, and time of administration.  Follow-up plan:   Return if symptoms worsen or fail to improve.     Recent Visits No visits were found meeting these conditions. Showing recent visits within past 90 days and meeting all other requirements Today's Visits Date Type Provider Dept  12/16/20 Office Visit Edward JollyLateef, Jaesean Litzau, MD [facility]  Showing today's visits and meeting all other requirements Future Appointments No visits were found meeting these conditions. Showing future appointments within next 90 days and meeting all other requirements Disposition: Discharge home  Discharge (Date  Time): 12/16/2020; 1030 hrs.   Primary Care Physician: Emi BelfastGessner, Deborah B, FNP [facility] Note by: Edward JollyBilal Sye Schroepfer, MD Date: 12/16/2020; Time: 11:07 AM  Disclaimer:  Medicine is not an exact science. The only guarantee in medicine is that nothing is guaranteed. It is important to note that the decision to proceed with this intervention was based on the information collected from the patient. The Data and conclusions were drawn from the patient's questionnaire, the interview, and the physical examination. Because the information was provided in large part by the patient, it cannot be guaranteed that it has not been purposely or unconsciously manipulated. Every effort has been made to obtain as much relevant data as possible for this evaluation. It is important to note that the conclusions that lead to this procedure are derived in large part from the available data. Always take into account that the treatment will also be dependent on availability of resources and existing treatment guidelines,  considered by other Pain Management Practitioners as being common knowledge and practice, at the time of the intervention. For Medico-Legal purposes, it is also important to point out that variation in procedural techniques and pharmacological choices are the acceptable norm. The indications, contraindications, technique, and results of the above procedure should only be interpreted and judged by a Board-Certified Interventional Pain Specialist with extensive familiarity and expertise in the same exact procedure and technique.

## 2020-12-16 NOTE — Progress Notes (Addendum)
PROVIDER NOTE: Information contained herein reflects review and annotations entered in association with encounter. Interpretation of such information and data should be left to medically-trained personnel. Information provided to patient can be located elsewhere in the medical record under "Patient Instructions". Document created using STT-dictation technology, any transcriptional errors that may result from process are unintentional.    Patient: Tami Foster  Service: Procedure (FAST-TRACK REFERRAL)  Provider: Gillis Santa, MD  DOB: May 30, 1980  DOS: 12/16/2020  Specialty: Interventional Pain Management  MRN: [id number]  Setting: Ambulatory outpatient  Referring Provider: Elby Beck, FNP  Type: New Patient  Location: Pain clinic facility  office  PCP: Elby Beck, FNP  NOTE: FAST-TRACK referrals are offered to accelerate the care of patients in the need of specific interventional pain management therapies. This type of referral does not include non-interventional pain management options.   Procedure  Primary Reason for Visit: Interventional Pain Management Treatment. CC: Headache  HPI  Tami Foster is a 40 y.o. year old, female patient, who comes today for a  "Fast-Track" new patient evaluation, as requested by Elby Beck, FNP. The patient has been made aware that this type of referral option is reserved for the Interventional Pain Management portion of our practice and completely excludes the option of medication management. Her primarily concern today is the Headache  Pain Assessment: Location:  (entire head and neck) Head Radiating:   Onset: More than a month ago Duration: Chronic pain Quality: Aching Severity: 7 /10 (subjective, self-reported pain score)  Effect on ADL: limits activities Timing: Constant Modifying factors: lying down BP: (!) 145/95  HR: 58   [name] is a pleasant 40 year old female who presents with a positional headache related to postdural  puncture after a C-section that was done on 12/03/2020.  Patient states that multiple attempts were required for epidural placement.  She states that her anesthesia care team told her that they did spinal tap her.  She states that her headache has been very debilitating.  She denies any phonophobia, photophobia, nausea, vomiting, lower extremity weakness.  Sitting and standing make her headache worse.  Lying supine improves her headache.  She is unable to take care of her child and pretty much has to lay supine to get any relief.  She has tried hydration, caffeine with limited response.  Today we discussed risks and benefits of lumbar epidural blood patch.  These were discussed in great detail.  Patient would like to proceed with lumbar epidural blood patch under fluoroscopy.   Meds   Current Outpatient Medications:    ALPRAZolam (XANAX) 1 MG tablet, Take 1 mg by mouth daily as needed for anxiety., Disp: , Rfl:    Butalbital-APAP-Caffeine 50-325-40 MG capsule, Take 1-2 capsules by mouth every 6 (six) hours as needed for headache., Disp: 30 capsule, Rfl: 2   ibuprofen (ADVIL) 600 MG tablet, TAKE 1 TABLET BY MOUTH EVERY 6 HOURS, Disp: 30 tablet, Rfl: 0   venlafaxine XR (EFFEXOR XR) 75 MG 24 hr capsule, Take 1 capsule (75 mg total) by mouth daily for 4 days, THEN 2 capsules (150 mg total) daily., Disp: 64 capsule, Rfl: 2  Imaging Review   Elbow-L DG Complete: Results for orders placed during the hospital encounter of 11/18/10  DG Elbow Complete Left  Narrative *RADIOLOGY REPORT*  Clinical Data: Left elbow pain after fall  LEFT ELBOW - COMPLETE 3+ VIEW  Comparison: None.  Findings: Small spur of the coronoid process. No evidence of acute fracture or subluxation.  No focal  bone lesions.  Bone matrix and cortex appear intact.  No abnormal radiopaque densities in the soft tissues.  No significant effusion  IMPRESSION: No acute bony abnormalities demonstrated in the left elbow.  Original  Report Authenticated By: Neale Burly, M.D.     Complexity Note: Imaging results reviewed. Results shared with Tami Foster, using Layman's terms.                         ROS  Cardiovascular: High blood pressure Pulmonary or Respiratory: Smoking Neurological: No reported neurological signs or symptoms such as seizures, abnormal skin sensations, urinary and/or fecal incontinence, being born with an abnormal open spine and/or a tethered spinal cord Psychological-Psychiatric: Anxiousness and Depressed Gastrointestinal: No reported gastrointestinal signs or symptoms such as vomiting or evacuating blood, reflux, heartburn, alternating episodes of diarrhea and constipation, inflamed or scarred liver, or pancreas or irrregular and/or infrequent bowel movements Genitourinary: No reported renal or genitourinary signs or symptoms such as difficulty voiding or producing urine, peeing blood, non-functioning kidney, kidney stones, difficulty emptying the bladder, difficulty controlling the flow of urine, or chronic kidney disease Hematological: No reported hematological signs or symptoms such as prolonged bleeding, low or poor functioning platelets, bruising or bleeding easily, hereditary bleeding problems, low energy levels due to low hemoglobin or being anemic Endocrine: No reported endocrine signs or symptoms such as high or low blood sugar, rapid heart rate due to high thyroid levels, obesity or weight gain due to slow thyroid or thyroid disease Rheumatologic: No reported rheumatological signs and symptoms such as fatigue, joint pain, tenderness, swelling, redness, heat, stiffness, decreased range of motion, with or without associated rash Musculoskeletal: Negative for myasthenia gravis, muscular dystrophy, multiple sclerosis or malignant hyperthermia Work History: Working full time  Allergies  Ms. Lorino has No Known Allergies.  Laboratory Chemistry Profile   Renal Lab Results  Component  Value Date   BUN 5 (L) 10/16/2020   CREATININE 0.41 (L) 10/16/2020   BCR 12 10/16/2020   GFR 110.79 05/22/2019   GFRAA 149 05/28/2020   GFRNONAA 129 05/28/2020   SPECGRAV <=1.005 03/28/2013   PHUR 6.0 03/28/2013   PROTEINUR NEGATIVE 05/04/2020     Electrolytes Lab Results  Component Value Date   NA 137 10/16/2020   K 3.9 10/16/2020   CL 105 10/16/2020   CALCIUM 8.9 10/16/2020   MG 1.9 07/20/2016     Hepatic Lab Results  Component Value Date   AST 15 10/16/2020   ALT 18 10/16/2020   ALBUMIN 3.6 (L) 10/16/2020   ALKPHOS 113 10/16/2020     ID Lab Results  Component Value Date   HIV Non Reactive 10/16/2020   SARSCOV2NAA NEGATIVE 12/02/2020   PREGTESTUR POSITIVE (A) 05/04/2020     Bone Lab Results  Component Value Date   VD25OH 22.15 (L) 09/22/2017   TESTOSTERONE 41 10/26/2015     Endocrine Lab Results  Component Value Date   GLUCOSE 78 10/16/2020   GLUCOSEU Negative 11/06/2020   HGBA1C 5.5 09/22/2017   TSH 0.46 05/22/2019   FREET4 1.05 04/17/2015   TESTOSTERONE 41 10/26/2015     Neuropathy Lab Results  Component Value Date   HGBA1C 5.5 09/22/2017   HIV Non Reactive 10/16/2020     CNS No results found for: COLORCSF, APPEARCSF, RBCCOUNTCSF, WBCCSF, POLYSCSF, LYMPHSCSF, EOSCSF, PROTEINCSF, GLUCCSF, JCVIRUS, CSFOLI, IGGCSF, LABACHR, ACETBL, LABACHR, ACETBL   Inflammation (CRP: Acute  ESR: Chronic) Lab Results  Component Value Date   ESRSEDRATE 6 03/12/2018  Rheumatology Lab Results  Component Value Date   ANA NEGATIVE 03/12/2018     Coagulation Lab Results  Component Value Date   PLT 170 12/04/2020     Cardiovascular Lab Results  Component Value Date   HGB 11.4 (L) 12/04/2020   HCT 33.7 (L) 12/04/2020     Screening Lab Results  Component Value Date   SARSCOV2NAA NEGATIVE 12/02/2020   HIV Non Reactive 10/16/2020   PREGTESTUR POSITIVE (A) 05/04/2020     Cancer No results found for: CEA, CA125, LABCA2   Allergens No results  found for: ALMOND, APPLE, ASPARAGUS, AVOCADO, BANANA, BARLEY, BASIL, BAYLEAF, GREENBEAN, LIMABEAN, WHITEBEAN, BEEFIGE, REDBEET, BLUEBERRY, BROCCOLI, CABBAGE, MELON, CARROT, CASEIN, CASHEWNUT, CAULIFLOWER, CELERY     Note: Lab results reviewed.  [place]  Drug: Ms. Womac  reports no history of drug use. Alcohol:  reports previous alcohol use. Tobacco:  reports that she has been smoking cigarettes. She has a 7.00 pack-year smoking history. She has never used smokeless tobacco. Medical:  has a past medical history of Abnormal Pap smear, Anxiety, Depression, Family history of adverse reaction to anesthesia, GERD (gastroesophageal reflux disease), Gestational diabetes, Hypertension, and Tachycardia. Family: family history includes Cancer in her mother; Cancer (age of onset: 32) in her father; Diabetes in her paternal grandmother; Emphysema in her father; Hypertension in her father and mother.  Past Surgical History:  Procedure Laterality Date   CESAREAN SECTION N/A 12/03/2020   Procedure: CESAREAN SECTION;  Surgeon: Gae Dry, MD;  Location: [facility] ORS;  Service: Obstetrics;  Laterality: N/A;   LEEP     PERIURETHRAL ABSCESS DRAINAGE     abscess removed from tonsils. Tonsils not removed   WISDOM TOOTH EXTRACTION     Active Ambulatory Problems    Diagnosis Date Noted   Anxiety 05/06/2013   Hirsutism 08/05/2014   Oligomenorrhea 08/05/2014   PCOS (polycystic ovarian syndrome) 08/05/2014   Smoker 08/05/2014   History of female hirsutism 11/11/2015   ASCUS favor benign 11/11/2015   Adjustment disorder with mixed anxiety and depressed mood 10/08/2017   Obesity, Class I, BMI 30-34.9 10/08/2017   Chronic fatigue 10/08/2017   Hypertension affecting pregnancy 05/28/2020   Tachycardia 05/28/2020   Multigravida of advanced maternal age 94/20/2022   Supervision of high risk pregnancy, antepartum 05/28/2020   H/O LEEP 06/11/2020   Insulin controlled gestational diabetes mellitus (GDM) during  pregnancy, antepartum 10/16/2020   History of preterm delivery 11/13/2020   Encounter for induction of labor 12/03/2020   Delivery by cesarean section for breech presentation    [redacted] weeks gestation of pregnancy    S/P cesarean section 12/09/2020   Resolved Ambulatory Problems    Diagnosis Date Noted   Supervision of normal first pregnancy 08/13/2012   Headache 11/07/2012   Tobacco abuse 11/28/2012   Fetal growth problem suspected but not found 02/08/2013   Threatened preterm labor 02/08/2013   Normal delivery 02/28/2013   Postpartum depression 03/28/2013   Encounter for insertion of mirena IUD 05/07/2013   Weight gain 08/05/2014   Past Medical History:  Diagnosis Date   Abnormal Pap smear    Depression    Family history of adverse reaction to anesthesia    GERD (gastroesophageal reflux disease)    Gestational diabetes    Hypertension    Constitutional Exam  General appearance: alert, cooperative, and in moderate distress Vitals:   12/16/20 0955 12/16/20 1000 12/16/20 1005 12/16/20 1024  BP: (!) 192/101 (!) 184/96 (!) 188/97 (!) 145/95  Pulse: 67 77 78 71  Resp: '18 19 18 18  ' Temp:      SpO2: 100% 99% 100% 100%  Weight:      Height:       BMI Assessment: Estimated body mass index is 29.81 kg/m as calculated from the following:   Height as of this encounter: '5\' 2"'  (1.575 m).   Weight as of this encounter: 163 lb (73.9 kg).  BMI interpretation table: BMI level Category Range association with higher incidence of chronic pain  <18 kg/m2 Underweight   18.5-24.9 kg/m2 Ideal body weight   25-29.9 kg/m2 Overweight Increased incidence by 20%  30-34.9 kg/m2 Obese (Class I) Increased incidence by 68%  35-39.9 kg/m2 Severe obesity (Class II) Increased incidence by 136%  >40 kg/m2 Extreme obesity (Class III) Increased incidence by 254%   Patient's current BMI Ideal Body weight  Body mass index is 29.81 kg/m. Ideal body weight: 50.1 kg (110 lb 7.2 oz) Adjusted ideal body  weight: 59.6 kg (131 lb 7.5 oz)   BMI Readings from Last 4 Encounters:  12/16/20 29.81 kg/m  12/09/20 29.81 kg/m  12/01/20 30.73 kg/m  11/27/20 30.73 kg/m   Wt Readings from Last 4 Encounters:  12/16/20 163 lb (73.9 kg)  12/09/20 163 lb (73.9 kg)  12/01/20 168 lb (76.2 kg)  11/27/20 168 lb (76.2 kg)  Extraocular muscles intact Patient laying supine on stretcher, somewhat uncomfortable.  Onset of headache with sitting or standing 5 out of 5 strength bilateral upper extremity: Shoulder abduction, elbow flexion, elbow extension, thumb extension. 5 out of 5 strength bilateral lower extremity: Plantar flexion, dorsiflexion, knee flexion, knee extension.  Procedure: Plan for lumbar epidural blood patch, see procedure note.

## 2020-12-16 NOTE — Patient Instructions (Signed)

## 2020-12-29 ENCOUNTER — Telehealth: Payer: Self-pay

## 2020-12-29 NOTE — Telephone Encounter (Signed)
Hilary Hertz, nurse c [facility] in [place]; pt scored 15 on [place]; she just restarted effexor and xanax she took prior to preg after delivery; she stopped taking her chronic hypertension med, metoprolol, while preg saying she didn't need it; BP yesterday 146/86 which sounded to pt like it's trying to creep up again; does she need to restart the metoprolol?  Nikki plans to see pt again next week to repeat [place] and BP.  [id number] # [phone]

## 2020-12-29 NOTE — Telephone Encounter (Signed)
Iy is recommended she resume her BP medicine

## 2020-12-31 NOTE — Telephone Encounter (Signed)
Pt aware to restart her BP medication.  Left detailed vm for Nikki.

## 2021-01-07 ENCOUNTER — Other Ambulatory Visit: Payer: Self-pay | Admitting: Obstetrics & Gynecology

## 2021-01-07 MED ORDER — NORETHINDRONE 0.35 MG PO TABS: 1.0000 | ORAL_TABLET | Freq: Every day | ORAL | 11 refills | Status: DC

## 2021-01-18 ENCOUNTER — Ambulatory Visit (INDEPENDENT_AMBULATORY_CARE_PROVIDER_SITE_OTHER): Payer: Medicaid Other | Admitting: Obstetrics & Gynecology

## 2021-01-18 ENCOUNTER — Other Ambulatory Visit (HOSPITAL_COMMUNITY)
Admission: RE | Admit: 2021-01-18 | Discharge: 2021-01-18 | Disposition: A | Discharge status: Home / Self Care | Payer: Medicaid Other | Source: Ambulatory Visit | Attending: Obstetrics & Gynecology | Admitting: Obstetrics & Gynecology

## 2021-01-18 ENCOUNTER — Other Ambulatory Visit: Payer: Medicaid Other

## 2021-01-18 ENCOUNTER — Other Ambulatory Visit: Payer: Self-pay

## 2021-01-18 ENCOUNTER — Encounter: Payer: Self-pay | Admitting: Obstetrics & Gynecology

## 2021-01-18 VITALS — BP 120/80 | Ht 62.0 in | Wt 147.0 lb

## 2021-01-18 DIAGNOSIS — O24414 Gestational diabetes mellitus in pregnancy, insulin controlled: Secondary | ICD-10-CM | POA: Diagnosis not present

## 2021-01-18 DIAGNOSIS — Z98891 History of uterine scar from previous surgery: Secondary | ICD-10-CM

## 2021-01-18 DIAGNOSIS — Z124 Encounter for screening for malignant neoplasm of cervix: Secondary | ICD-10-CM | POA: Diagnosis not present

## 2021-01-18 DIAGNOSIS — Z3009 Encounter for other general counseling and advice on contraception: Secondary | ICD-10-CM

## 2021-01-18 NOTE — Patient Instructions (Signed)
Surgery to Prevent Pregnancy °Sterilization is surgery to prevent pregnancy. Sterilization is permanent. It should only be done if you are sure that you do not want to have children. °For females, the fallopian tubes are either blocked or closed off. When the fallopian tubes are closed, the eggs that the ovaries release cannot enter the uterus, sperm cannot reach the eggs, and pregnancy is prevented. °For males, the vas deferens is cut and then tied or burned (cauterized). The vas deferens is a tube that carries sperm from the testicles. This procedure prevents pregnancy by blocking sperm from going through the vas deferens and penis during ejaculation. °Types of sterilization °  °For females, the surgeries include: °Laparoscopic tubal ligation. In this surgery, the fallopian tubes are tied off, sealed with heat, or blocked with a clip, ring, or clamp. A small portion of each fallopian tube may also be removed. This surgery is done through several small cuts (incisions) with special instruments that are inserted into the abdomen. °Postpartum tubal ligation. This is also called a mini-laparotomy. This surgery is done right after childbirth or 1 or 2 days after childbirth. In this surgery, the fallopian tubes are tied off, sealed with heat, or blocked with a clip, ring, or clamp. A small portion of each fallopian tube may also be removed. The surgery is done through a single incision in the abdomen. °Tubal ligation during a C-section. In this surgery, the fallopian tubes are tied off, sealed with heat, or blocked with a clip, ring, or clamp. A small portion of each fallopian tube may also be removed. The surgery is done at the same time as a C-section delivery. °For males, the surgeries include: °Incision vasectomy. In this surgery, one or two small incisions are made in the scrotum. The vas deferens will be pulled out of the scrotum and cut. The vas deferens will be tied off or sealed with heat and placed back into  your scrotum. The incision will be closed with absorbable stitches (sutures). °No scalpel vasectomy. In this surgery, a punctured opening is made in the scrotum. The vas deferens will be pulled out of the scrotum and cut. The vas deferens will be tied off or sealed with heat and placed back into your scrotum. The opening is small and will not require sutures. °What are the benefits of sterilization? °It is usually effective for a lifetime. °The procedures are generally safe. °For females, sterilization does not affect the hormones, like other types of birth control. Because of this, menstrual periods will not be affected. °For both males and females, sexual desire and sexual performance will not be affected. °What are the disadvantages of sterilization? °Risks from the surgery °Generally, sterilization is safe. Complications are rare. However, there are some risks. They include: °Bleeding. °Infection. °Reaction to medicine used during the procedure. °Injury to surrounding organs. °Risks after sterilization °After a successful surgery, you may have other problems. Female sterilization risks may include: °Failure of the procedure. Sterilization is nearly 100% effective, but it can fail. In rare cases, the fallopian tubes can grow back together over time. If this happens, a female will be able to get pregnant again. °A higher risk of having an ectopic pregnancy. An ectopic pregnancy is a pregnancy that grows outside of the uterus. This kind of pregnancy can lead to serious bleeding if it is not treated. °Female sterilization risks may include: °Bleeding and swelling of the scrotum. °Failure of the procedure. There is a very small chance that the tied or cauterized   ends of the vas deferens may reconnect (recanalization). If this happens, a female could still make a female pregnant. °Other risks may include: °A risk that you may change your mind and decide you want have children. Sterilization may be reversed, but a reversal  is not always successful. °Lack of protection against sexually transmitted infections (STIs). °What happens during the procedure? °The steps of the procedure depend on the type of sterilization you are having. The procedure may vary among health care providers and hospitals. °Questions to ask your health care provider °How effective are sterilization procedures? °What type of procedure is right for me? °Is it possible to reverse the procedure if I change my mind? °What can I expect after the procedure? °Where to find more information °American College of Obstetricians and Gynecologists: www.acog.org/ °[place] Department of Health and Human Services: www.womenshealth.gov/ °[facility] [facility]: www.urologyhealth.org °Summary °Sterilization is surgery to prevent pregnancy. °There are different types of sterilization surgeries. °Sterilization may be reversed, but a reversal is not always successful. °Sterilization does not protect against STIs. °This information is not intended to replace advice given to you by your health care provider. Make sure you discuss any questions you have with your health care provider. °Document Revised: 01/25/2020 Document Reviewed: 01/25/2020 °Elsevier Patient Education © 2022 Elsevier Inc. ° °

## 2021-01-18 NOTE — Progress Notes (Signed)
  OBSTETRICS POSTPARTUM CLINIC PROGRESS NOTE  Subjective:     Tami Foster is a 40 y.o. [phone] female who presents for a postpartum visit. She is 6 weeks postpartum following a Term pregnancy or Pregnancy complicated by: Diabetes and delivery by C-section, no problems at delivery (breech)  I have fully reviewed the prenatal and intrapartum course. Anesthesia: spinal.  Postpartum course has been complicated by uncomplicated.  Baby is feeding by Bottle.  Bleeding: patient has  resumed menses.  Bowel function is normal. Bladder function is normal.  Patient is not sexually active. Contraception method desired is oral progesterone-only contraceptive. She does desire TUBAL procedure in the near future (prior papers signed) Postpartum depression screening: positive. [place] 10.  She has been taking Effexor 75 mg daily (plans to increase to dose of 150 mg daily soon)  The following portions of the patient's history were reviewed and updated as appropriate: allergies, current medications, past family history, past medical history, past social history, past surgical history, and problem list.  Review of Systems Pertinent items are noted in HPI.  Objective:    BP 120/80   Ht 5\' 2"  (1.575 m)   Wt 147 lb (66.7 kg)   LMP 01/09/2021 (Approximate)   Breastfeeding No   BMI 26.89 kg/m   General:  alert and no distress   Breasts:  inspection negative, no nipple discharge or bleeding, no masses or nodularity palpable  Lungs: clear to auscultation bilaterally  Heart:  regular rate and rhythm, S1, S2 normal, no murmur, click, rub or gallop  Abdomen: soft, non-tender; bowel sounds normal; no masses,  no organomegaly.  Well healed Pfannenstiel incision   Vulva:  normal  Vagina: normal vagina, no discharge, exudate, lesion, or erythema  Cervix:  no cervical motion tenderness and no lesions  Corpus: normal size, contour, position, consistency, mobility, non-tender  Adnexa:  normal adnexa and no mass,  fullness, tenderness  Rectal Exam: Not performed.          Assessment:  Post Partum Care visit 1. S/P cesarean section  2. Sterilization consult The patient has been fully informed about all methods of contraception, both temporary and permanent. She understands that tubal ligation is meant to be permanent, absolute and irreversible. She was told that there is an approximately 1 in 400 chance of a pregnancy in the future after tubal ligation. She was told the short and long term complications of tubal ligation. She understands the risks from this surgery include, but are not limited to, the risks of anesthesia, hemorrhage, infection, perforation, and injury to adjacent structures, bowel, bladder and blood vessels.   Plan:  See orders and Patient Instructions Resume all normal activities Follow up in: 1 month or as needed.  PAP today Discussed PPD, will titrate dose of Effexor to therapeutic level Glucose testing today  03/11/2021, MD, Annamarie Major Ob/Gyn, [facility] 01/18/2021  10:44 AM

## 2021-01-19 LAB — GLUCOSE TOLERANCE, 2 HOURS
Glucose, 2 hour: 101 mg/dL (ref 65–139)
Glucose, GTT - Fasting: 79 mg/dL (ref 65–99)

## 2021-01-25 ENCOUNTER — Other Ambulatory Visit: Payer: Self-pay | Admitting: Obstetrics and Gynecology

## 2021-01-25 LAB — CYTOLOGY - PAP
Comment: NEGATIVE
Diagnosis: NEGATIVE
Diagnosis: REACTIVE
High risk HPV: NEGATIVE

## 2021-01-27 ENCOUNTER — Telehealth: Payer: Self-pay

## 2021-01-27 NOTE — Telephone Encounter (Signed)
-----   Message from Nadara Mustard, MD sent at 01/26/2021 10:52 AM EDT ----- Regarding: Surgery Meant to send this to you last week  Surgery Booking Request Patient Full Name:  Tami Foster  MRN: [id number]  DOB: 1981-02-03  Surgeon: [name], MD  Requested Surgery Date and Time: Any Primary Diagnosis AND Code: Sterilization  Secondary Diagnosis and Code:  Surgical Procedure: Laparoscopy with Tubal Partial Salingectomy RNFA Requested?: No L&D Notification: No Admission Status: same day surgery Length of Surgery: 25 min Special Case Needs: No H&P: No Phone Interview???:  Yes Interpreter: No Medical Clearance:  No Special Scheduling Instructions: No Any known health/anesthesia issues, diabetes, sleep apnea, latex allergy, defibrillator/pacemaker?: No Acuity: P3   (P1 highest, P2 delay may cause harm, P3 low, elective gyn, P4 lowest) Post op follow up visits: 2 weeks

## 2021-01-27 NOTE — Telephone Encounter (Signed)
Called patient to schedule Laparoscopy w Tubal Partial Salpingectomy w Tiburcio Pea  DOS 10/6  H&P  DOS  Pre-admit phone call appointment to be requested - date and time will be included on H&P paper work. Also all appointments will be updated on pt MyChart. Explained that this appointment has a call window. Based on the time scheduled will indicate if the call will be received within a 4 hour window before 1:00 or after.  Advised that pt may also receive calls from the hospital pharmacy and pre-service center.  Confirmed pt has [employer] as Editor, commissioning. No secondary insurance.   Has signed BTL consent

## 2021-01-28 ENCOUNTER — Other Ambulatory Visit: Payer: Self-pay | Admitting: Obstetrics & Gynecology

## 2021-01-28 NOTE — Telephone Encounter (Signed)
Refill sent.

## 2021-02-05 ENCOUNTER — Other Ambulatory Visit: Payer: Self-pay

## 2021-02-05 ENCOUNTER — Encounter: Payer: Self-pay | Admitting: *Deleted

## 2021-02-05 ENCOUNTER — Encounter
Admission: RE | Admit: 2021-02-05 | Discharge: 2021-02-05 | Disposition: A | Discharge status: Home / Self Care | Payer: Medicaid Other | Source: Ambulatory Visit | Attending: Obstetrics & Gynecology | Admitting: Obstetrics & Gynecology

## 2021-02-05 HISTORY — DX: Other reaction to spinal and lumbar puncture: G97.1

## 2021-02-05 NOTE — Patient Instructions (Signed)
Your procedure is scheduled on:02-11-21 Thursday Report to the Registration Desk on the 1st floor of the Medical Mall.Then proceed to the 2nd floor Surgery Desk in the Medical [facility] To find out your arrival time, please call [phone] between 1PM - 3PM on:02-10-21 Wednesday  REMEMBER: Instructions that are not followed completely may result in serious medical risk, up to and including death; or upon the discretion of your surgeon and anesthesiologist your surgery may need to be rescheduled.  Do not eat food after midnight the night before surgery.  No gum chewing, lozengers or hard candies.  You may however, drink CLEAR liquids up to 2 hours before you are scheduled to arrive for your surgery. Do not drink anything within 2 hours of your scheduled arrival time.  Clear liquids include: - water  - apple juice without pulp - gatorade (not RED, PURPLE, OR BLUE) - black coffee or tea (Do NOT add milk or creamers to the coffee or tea) Do NOT drink anything that is not on this list.  TAKE THESE MEDICATIONS THE MORNING OF SURGERY WITH A SIP OF WATER: -venlafaxine XR (EFFEXOR XR) 75 MG 24 hr capsule -You may take ALPRAZolam (XANAX) 1 MG tablet if needed the morning of surgery  One week prior to surgery: Stop Anti-inflammatories (NSAIDS) such as Advil, Aleve, Ibuprofen, Motrin, Naproxen, Naprosyn and Aspirin based products such as Excedrin, Goodys Powder, BC Powder.You may however, continue to take Tylenol if needed for pain up until the day of surgery.  Stop ANY OVER THE COUNTER supplements/vitamins NOW (02-05-21) until after surgery.  No Alcohol for 24 hours before or after surgery.  No Smoking including e-cigarettes for 24 hours prior to surgery.  No chewable tobacco products for at least 6 hours prior to surgery.  No nicotine patches on the day of surgery.  Do not use any "recreational" drugs for at least a week prior to your surgery.  Please be advised that the combination of cocaine  and anesthesia may have negative outcomes, up to and including death. If you test positive for cocaine, your surgery will be cancelled.  On the morning of surgery brush your teeth with toothpaste and water, you may rinse your mouth with mouthwash if you wish. Do not swallow any toothpaste or mouthwash.  Use CHG Soap as directed on instruction sheet.  Do not wear jewelry, make-up, hairpins, clips or nail polish.  Do not wear lotions, powders, or perfumes.   Do not shave body from the neck down 48 hours prior to surgery just in case you cut yourself which could leave a site for infection.  Also, freshly shaved skin may become irritated if using the CHG soap.  Contact lenses, hearing aids and dentures may not be worn into surgery.  Do not bring valuables to the hospital. [facility] is not responsible for any missing/lost belongings or valuables.   Notify your doctor if there is any change in your medical condition (cold, fever, infection).  Wear comfortable clothing (specific to your surgery type) to the hospital.  After surgery, you can help prevent lung complications by doing breathing exercises.  Take deep breaths and cough every 1-2 hours. Your doctor may order a device called an Incentive Spirometer to help you take deep breaths. When coughing or sneezing, hold a pillow firmly against your incision with both hands. This is called "splinting." Doing this helps protect your incision. It also decreases belly discomfort.  If you are being admitted to the hospital overnight, leave your suitcase  in the car. After surgery it may be brought to your room.  If you are being discharged the day of surgery, you will not be allowed to drive home. You will need a responsible adult (18 years or older) to drive you home and stay with you that night.   If you are taking public transportation, you will need to have a responsible adult (18 years or older) with you. Please confirm with your physician  that it is acceptable to use public transportation.   Please call the Pre-admissions Testing Dept. at [phone] if you have any questions about these instructions.  Surgery Visitation Policy:  Patients undergoing a surgery or procedure may have one family member or support person with them as long as that person is not COVID-19 positive or experiencing its symptoms.  That person may remain in the waiting area during the procedure and may rotate out with other people.  Inpatient Visitation:    Visiting hours are 7 a.m. to 8 p.m. Up to two visitors ages 16+ are allowed at one time in a patient room. The visitors may rotate out with other people during the day. Visitors must check out when they leave, or other visitors will not be allowed. One designated support person may remain overnight. The visitor must pass COVID-19 screenings, use hand sanitizer when entering and exiting the patient's room and wear a mask at all times, including in the patient's room. Patients must also wear a mask when staff or their visitor are in the room. Masking is required regardless of vaccination status.

## 2021-02-08 ENCOUNTER — Encounter
Admission: RE | Admit: 2021-02-08 | Discharge: 2021-02-08 | Disposition: A | Discharge status: Home / Self Care | Payer: Medicaid Other | Source: Ambulatory Visit | Attending: Obstetrics & Gynecology | Admitting: Obstetrics & Gynecology

## 2021-02-08 ENCOUNTER — Other Ambulatory Visit: Payer: Self-pay

## 2021-02-08 DIAGNOSIS — Z01812 Encounter for preprocedural laboratory examination: Secondary | ICD-10-CM | POA: Insufficient documentation

## 2021-02-08 LAB — CBC
HCT: 47.6 % — ABNORMAL HIGH (ref 36.0–46.0)
Hemoglobin: 16.3 g/dL — ABNORMAL HIGH (ref 12.0–15.0)
MCH: 32.1 pg (ref 26.0–34.0)
MCHC: 34.2 g/dL (ref 30.0–36.0)
MCV: 93.7 fL (ref 80.0–100.0)
Platelets: 246 10*3/uL (ref 150–400)
RBC: 5.08 MIL/uL (ref 3.87–5.11)
RDW: 14 % (ref 11.5–15.5)
WBC: 10.5 10*3/uL (ref 4.0–10.5)
nRBC: 0 % (ref 0.0–0.2)

## 2021-02-08 LAB — TYPE AND SCREEN
ABO/RH(D): B POS
Antibody Screen: NEGATIVE
Extend sample reason: UNDETERMINED

## 2021-02-11 ENCOUNTER — Ambulatory Visit: Payer: Medicaid Other | Admitting: Urgent Care

## 2021-02-11 ENCOUNTER — Encounter: Payer: Self-pay | Admitting: Obstetrics & Gynecology

## 2021-02-11 ENCOUNTER — Encounter
Admission: RE | Disposition: A | Discharge status: Home / Self Care | Payer: Self-pay | Source: Ambulatory Visit | Attending: Obstetrics & Gynecology

## 2021-02-11 ENCOUNTER — Ambulatory Visit: Payer: Medicaid Other | Admitting: Anesthesiology

## 2021-02-11 ENCOUNTER — Other Ambulatory Visit: Payer: Self-pay

## 2021-02-11 ENCOUNTER — Ambulatory Visit
Admission: RE | Admit: 2021-02-11 | Discharge: 2021-02-11 | Disposition: A | Discharge status: Home / Self Care | Payer: Medicaid Other | Source: Ambulatory Visit | Attending: Obstetrics & Gynecology | Admitting: Obstetrics & Gynecology

## 2021-02-11 DIAGNOSIS — F17200 Nicotine dependence, unspecified, uncomplicated: Secondary | ICD-10-CM | POA: Insufficient documentation

## 2021-02-11 DIAGNOSIS — Z793 Long term (current) use of hormonal contraceptives: Secondary | ICD-10-CM | POA: Diagnosis not present

## 2021-02-11 DIAGNOSIS — Z8632 Personal history of gestational diabetes: Secondary | ICD-10-CM | POA: Insufficient documentation

## 2021-02-11 DIAGNOSIS — Z79899 Other long term (current) drug therapy: Secondary | ICD-10-CM | POA: Diagnosis not present

## 2021-02-11 DIAGNOSIS — Z302 Encounter for sterilization: Secondary | ICD-10-CM | POA: Diagnosis not present

## 2021-02-11 DIAGNOSIS — Z98891 History of uterine scar from previous surgery: Secondary | ICD-10-CM | POA: Insufficient documentation

## 2021-02-11 HISTORY — PX: LAPAROSCOPIC BILATERAL SALPINGECTOMY: SHX5889

## 2021-02-11 LAB — POCT PREGNANCY, URINE: Preg Test, Ur: NEGATIVE

## 2021-02-11 SURGERY — SALPINGECTOMY, BILATERAL, LAPAROSCOPIC
Anesthesia: General | Laterality: Bilateral

## 2021-02-11 MED ORDER — OXYCODONE-ACETAMINOPHEN 5-325 MG PO TABS: 1.0000 | ORAL_TABLET | ORAL | 0 refills | Status: DC | PRN

## 2021-02-11 MED ORDER — PROMETHAZINE HCL 25 MG/ML IJ SOLN: 6.2500 mg | INTRAMUSCULAR | Status: DC | PRN

## 2021-02-11 MED ORDER — LACTATED RINGERS IV SOLN: INTRAVENOUS | Status: DC

## 2021-02-11 MED ORDER — FAMOTIDINE 20 MG PO TABS: 20.0000 mg | ORAL_TABLET | Freq: Once | ORAL | Status: AC

## 2021-02-11 MED ORDER — MIDAZOLAM HCL 2 MG/2ML IJ SOLN
INTRAMUSCULAR | Status: DC | PRN
  Administered 2021-02-11: 4 mg via INTRAVENOUS

## 2021-02-11 MED ORDER — MORPHINE SULFATE (PF) 2 MG/ML IV SOLN: 1.0000 mg | INTRAVENOUS | Status: DC | PRN

## 2021-02-11 MED ORDER — DEXAMETHASONE SODIUM PHOSPHATE 10 MG/ML IJ SOLN
INTRAMUSCULAR | Status: DC | PRN
  Administered 2021-02-11: 10 mg via INTRAVENOUS

## 2021-02-11 MED ORDER — LIDOCAINE HCL (CARDIAC) PF 100 MG/5ML IV SOSY
PREFILLED_SYRINGE | INTRAVENOUS | Status: DC | PRN
  Administered 2021-02-11: 100 mg via INTRAVENOUS

## 2021-02-11 MED ORDER — KETOROLAC TROMETHAMINE 30 MG/ML IJ SOLN
INTRAMUSCULAR | Status: DC | PRN
  Administered 2021-02-11: 30 mg via INTRAVENOUS

## 2021-02-11 MED ORDER — ONDANSETRON HCL 4 MG/2ML IJ SOLN
INTRAMUSCULAR | Status: DC | PRN
  Administered 2021-02-11: 4 mg via INTRAVENOUS

## 2021-02-11 MED ORDER — CEFAZOLIN SODIUM-DEXTROSE 1-4 GM/50ML-% IV SOLN
INTRAVENOUS | Status: AC
  Filled 2021-02-11: qty 50

## 2021-02-11 MED ORDER — FENTANYL CITRATE (PF) 100 MCG/2ML IJ SOLN
INTRAMUSCULAR | Status: DC | PRN
  Administered 2021-02-11: 50 ug via INTRAVENOUS
  Administered 2021-02-11 (×2): 100 ug via INTRAVENOUS

## 2021-02-11 MED ORDER — ROCURONIUM BROMIDE 100 MG/10ML IV SOLN
INTRAVENOUS | Status: DC | PRN
  Administered 2021-02-11: 50 mg via INTRAVENOUS

## 2021-02-11 MED ORDER — PROPOFOL 10 MG/ML IV BOLUS
INTRAVENOUS | Status: AC
  Filled 2021-02-11: qty 40

## 2021-02-11 MED ORDER — ACETAMINOPHEN 10 MG/ML IV SOLN
INTRAVENOUS | Status: DC | PRN
  Administered 2021-02-11: 1000 mg via INTRAVENOUS

## 2021-02-11 MED ORDER — FENTANYL CITRATE (PF) 100 MCG/2ML IJ SOLN: 25.0000 ug | INTRAMUSCULAR | Status: DC | PRN

## 2021-02-11 MED ORDER — POVIDONE-IODINE 10 % EX SWAB
2.0000 "application " | Freq: Once | CUTANEOUS | Status: AC
  Administered 2021-02-11: 2 via TOPICAL

## 2021-02-11 MED ORDER — FENTANYL CITRATE (PF) 250 MCG/5ML IJ SOLN
INTRAMUSCULAR | Status: AC
  Filled 2021-02-11: qty 5

## 2021-02-11 MED ORDER — CHLORHEXIDINE GLUCONATE 0.12 % MT SOLN
OROMUCOSAL | Status: AC
  Administered 2021-02-11: 15 mL via OROMUCOSAL
  Filled 2021-02-11: qty 15

## 2021-02-11 MED ORDER — PHENYLEPHRINE HCL (PRESSORS) 10 MG/ML IV SOLN
INTRAVENOUS | Status: AC
  Filled 2021-02-11: qty 1

## 2021-02-11 MED ORDER — FAMOTIDINE 20 MG PO TABS
ORAL_TABLET | ORAL | Status: AC
  Administered 2021-02-11: 20 mg via ORAL
  Filled 2021-02-11: qty 1

## 2021-02-11 MED ORDER — OXYCODONE HCL 5 MG PO TABS
ORAL_TABLET | ORAL | Status: AC
  Administered 2021-02-11: 5 mg via ORAL
  Filled 2021-02-11: qty 1

## 2021-02-11 MED ORDER — 0.9 % SODIUM CHLORIDE (POUR BTL) OPTIME
TOPICAL | Status: DC | PRN
  Administered 2021-02-11: 150 mL

## 2021-02-11 MED ORDER — ACETAMINOPHEN 325 MG PO TABS: 650.0000 mg | ORAL_TABLET | ORAL | Status: DC | PRN

## 2021-02-11 MED ORDER — BUPIVACAINE HCL (PF) 0.5 % IJ SOLN
INTRAMUSCULAR | Status: DC | PRN
  Administered 2021-02-11: 14 mL

## 2021-02-11 MED ORDER — PROPOFOL 10 MG/ML IV BOLUS
INTRAVENOUS | Status: DC | PRN
  Administered 2021-02-11: 150 mg via INTRAVENOUS
  Administered 2021-02-11: 50 mg via INTRAVENOUS

## 2021-02-11 MED ORDER — SEVOFLURANE IN SOLN
RESPIRATORY_TRACT | Status: AC
  Filled 2021-02-11: qty 250

## 2021-02-11 MED ORDER — BUPIVACAINE HCL (PF) 0.5 % IJ SOLN
INTRAMUSCULAR | Status: AC
  Filled 2021-02-11: qty 30

## 2021-02-11 MED ORDER — SUGAMMADEX SODIUM 200 MG/2ML IV SOLN
INTRAVENOUS | Status: DC | PRN
  Administered 2021-02-11: 263.2 mg via INTRAVENOUS

## 2021-02-11 MED ORDER — ACETAMINOPHEN 10 MG/ML IV SOLN
INTRAVENOUS | Status: AC
  Filled 2021-02-11: qty 100

## 2021-02-11 MED ORDER — ACETAMINOPHEN 650 MG RE SUPP
650.0000 mg | RECTAL | Status: DC | PRN
  Filled 2021-02-11: qty 1

## 2021-02-11 MED ORDER — CHLORHEXIDINE GLUCONATE 0.12 % MT SOLN: 15.0000 mL | Freq: Once | OROMUCOSAL | Status: AC

## 2021-02-11 MED ORDER — OXYCODONE-ACETAMINOPHEN 5-325 MG PO TABS: 1.0000 | ORAL_TABLET | ORAL | Status: DC | PRN

## 2021-02-11 MED ORDER — ORAL CARE MOUTH RINSE: 15.0000 mL | Freq: Once | OROMUCOSAL | Status: AC

## 2021-02-11 MED ORDER — MIDAZOLAM HCL 2 MG/2ML IJ SOLN
INTRAMUSCULAR | Status: AC
  Filled 2021-02-11: qty 4

## 2021-02-11 MED ORDER — OXYCODONE HCL 5 MG PO TABS: 5.0000 mg | ORAL_TABLET | Freq: Once | ORAL | Status: AC

## 2021-02-11 MED ORDER — CEFAZOLIN SODIUM-DEXTROSE 1-4 GM/50ML-% IV SOLN
1.0000 g | Freq: Once | INTRAVENOUS | Status: AC
  Administered 2021-02-11: 2 g via INTRAVENOUS

## 2021-02-11 SURGICAL SUPPLY — 31 items
ADH SKN CLS APL DERMABOND .7 (GAUZE/BANDAGES/DRESSINGS) ×1
BLADE SURG SZ11 CARB STEEL (BLADE) ×2 IMPLANT
CATH ROBINSON RED A/P 16FR (CATHETERS) ×2 IMPLANT
DERMABOND ADVANCED (GAUZE/BANDAGES/DRESSINGS) ×1
DERMABOND ADVANCED .7 DNX12 (GAUZE/BANDAGES/DRESSINGS) ×1 IMPLANT
GAUZE 4X4 16PLY ~~LOC~~+RFID DBL (SPONGE) ×2 IMPLANT
GLOVE SURG ENC MOIS LTX SZ8 (GLOVE) ×4 IMPLANT
GLOVE SURG UNDER LTX SZ8 (GLOVE) ×2 IMPLANT
GOWN STRL REUS W/ TWL LRG LVL3 (GOWN DISPOSABLE) ×1 IMPLANT
GOWN STRL REUS W/ TWL XL LVL3 (GOWN DISPOSABLE) ×1 IMPLANT
GOWN STRL REUS W/TWL LRG LVL3 (GOWN DISPOSABLE) ×2
GOWN STRL REUS W/TWL XL LVL3 (GOWN DISPOSABLE) ×2
GRASPER SUT TROCAR 14GX15 (MISCELLANEOUS) ×2 IMPLANT
KIT PINK PAD W/HEAD ARE REST (MISCELLANEOUS) ×2
KIT PINK PAD W/HEAD ARM REST (MISCELLANEOUS) ×1 IMPLANT
LABEL OR SOLS (LABEL) ×2 IMPLANT
MANIFOLD NEPTUNE II (INSTRUMENTS) ×2 IMPLANT
NEEDLE VERESS 14GA 120MM (NEEDLE) ×2 IMPLANT
NS IRRIG 500ML POUR BTL (IV SOLUTION) ×2 IMPLANT
PACK GYN LAPAROSCOPIC (MISCELLANEOUS) ×2 IMPLANT
PAD PREP 24X41 OB/GYN DISP (PERSONAL CARE ITEMS) ×2 IMPLANT
SCRUB EXIDINE 4% CHG 4OZ (MISCELLANEOUS) ×2 IMPLANT
SET TUBE SMOKE EVAC HIGH FLOW (TUBING) ×2 IMPLANT
SHEARS HARMONIC ACE PLUS 36CM (ENDOMECHANICALS) ×2 IMPLANT
SLEEVE ENDOPATH XCEL 5M (ENDOMECHANICALS) ×4 IMPLANT
SPONGE GAUZE 2X2 8PLY STRL LF (GAUZE/BANDAGES/DRESSINGS) ×2 IMPLANT
SUT VIC AB 0 CT1 36 (SUTURE) ×2 IMPLANT
SUT VIC AB 4-0 PS2 18 (SUTURE) ×4 IMPLANT
SYR 10ML LL (SYRINGE) ×2 IMPLANT
TROCAR XCEL NON-BLD 5MMX100MML (ENDOMECHANICALS) ×2 IMPLANT
WATER STERILE IRR 500ML POUR (IV SOLUTION) ×2 IMPLANT

## 2021-02-11 NOTE — H&P (Signed)
Tami Foster is an 40 y.o. female. Presents w desire for permanent sterilization.  She has 2 children (one recently born) and two step children. Both pt and husband prefer no more children.  Pertinent Gynecological History: Previous GYN Procedures:  CS, LEEP   OB History: G3, P2012   Menstrual History: Patient's last menstrual period was 02/05/2021 (approximate).    Past Medical History:  Diagnosis Date   Abnormal Pap smear    Anxiety    Depression    Family history of adverse reaction to anesthesia    sister difficult to wake   GERD (gastroesophageal reflux disease)    Gestational diabetes    Hypertension    h/o prior to pregnancy-pt has now delivered and bp is under control-Pt will ask Dr Velora Mediate ob/gyn if she needs to restart this   Spinal headache    after epidural with section on 12-03-20-had blood patch and ha resolved   Tachycardia     Past Surgical History:  Procedure Laterality Date   CESAREAN SECTION N/A 12/03/2020   Procedure: CESAREAN SECTION;  Surgeon: Nadara Mustard, MD;  Location: [facility] ORS;  Service: Obstetrics;  Laterality: N/A;   LEEP     PERIURETHRAL ABSCESS DRAINAGE     abscess removed from tonsils. Tonsils not removed   WISDOM TOOTH EXTRACTION      Family History  Problem Relation Age of Onset   Cancer Mother        bladder   Hypertension Mother    Emphysema Father    Hypertension Father    Cancer Father 65       Colon   Diabetes Paternal Grandmother    Other Neg Hx     Social History:  reports that she has been smoking cigarettes. She has a 7.00 pack-year smoking history. She has never used smokeless tobacco. She reports current alcohol use. She reports that she does not use drugs.  Allergies: No Known Allergies  Medications Prior to Admission  Medication Sig Dispense Refill Last Dose   ALPRAZolam (XANAX) 1 MG tablet TAKE 1 TABLET (1 MG TOTAL) BY MOUTH AT BEDTIME AS NEEDED FOR SLEEP. (Patient taking differently: Take 1 mg by mouth  daily as needed for anxiety or sleep.) 30 tablet 0 02/10/2021   norethindrone (MICRONOR) 0.35 MG tablet Take 1 tablet (0.35 mg total) by mouth daily. 28 tablet 11 02/10/2021   venlafaxine XR (EFFEXOR XR) 75 MG 24 hr capsule Take 1 capsule (75 mg total) by mouth daily for 4 days, THEN 2 capsules (150 mg total) daily. (Patient taking differently: Take 1 capsule (75 mg total) by mouth daily for 4 days, THEN 2 capsules (150 mg total) daily.-AM) 64 capsule 2    ibuprofen (ADVIL) 600 MG tablet TAKE 1 TABLET BY MOUTH EVERY 6 HOURS (Patient not taking: Reported on 02/02/2021) 30 tablet 0 Completed Course    Review of Systems  Constitutional:  Negative for chills and fever.  HENT:  Negative for congestion, sinus pain and sore throat.   Eyes:  Negative for pain.  Respiratory:  Negative for cough and wheezing.   Cardiovascular:  Negative for chest pain and leg swelling.  Gastrointestinal:  Negative for abdominal pain, constipation, diarrhea, nausea and vomiting.  Genitourinary:  Negative for dysuria, frequency, hematuria and urgency.  Musculoskeletal:  Negative for back pain, myalgias and neck pain.  Skin:  Negative for rash.  Neurological:  Negative for dizziness, tremors and weakness.  Hematological:  Does not bruise/bleed easily.  Psychiatric/Behavioral:  The  patient is not nervous/anxious.    Temperature 97.8 F (36.6 C), temperature source Tympanic, resp. rate 16, height 5\' 2"  (1.575 m), weight 65.8 kg, last menstrual period 02/05/2021, SpO2 100 %, not currently breastfeeding. Physical Exam Constitutional:      Appearance: Normal appearance. She is normal weight.  HENT:     Head: Normocephalic and atraumatic.     Nose: Nose normal.     Mouth/Throat:     Mouth: Mucous membranes are dry.     Pharynx: Oropharynx is clear.  Cardiovascular:     Rate and Rhythm: Normal rate and regular rhythm.     Pulses: Normal pulses.  Pulmonary:     Effort: Pulmonary effort is normal.     Breath sounds:  Normal breath sounds.  Abdominal:     General: There is no distension.     Palpations: Abdomen is soft.     Tenderness: There is no abdominal tenderness.  Musculoskeletal:        General: Normal range of motion.     Cervical back: Normal range of motion and neck supple.  Skin:    General: Skin is warm and dry.  Neurological:     General: No focal deficit present.     Mental Status: She is alert.  Psychiatric:        Mood and Affect: Mood normal.        Behavior: Behavior normal.        Thought Content: Thought content normal.        Judgment: Judgment normal.    Assessment/Plan: Desires laparoscopic tubal procedure  The patient has been fully informed about all methods of contraception, both temporary and permanent. She understands that tubal ligation is meant to be permanent, absolute and irreversible. She was told that there is an approximately 1 in 400 chance of a pregnancy in the future after tubal ligation. She was told the short and long term complications of tubal ligation. She understands the risks from this surgery include, but are not limited to, the risks of anesthesia, hemorrhage, infection, perforation, and injury to adjacent structures, bowel, bladder and blood vessels.   02/07/2021, MD, Annamarie Major Ob/Gyn, [facility] 02/11/2021  7:21 AM

## 2021-02-11 NOTE — Anesthesia Preprocedure Evaluation (Signed)
Anesthesia Evaluation  Patient identified by MRN, date of birth, ID band Patient awake    Reviewed: Allergy & Precautions, NPO status , Patient's Chart, lab work & pertinent test results, reviewed documented beta blocker date and time   History of Anesthesia Complications (+) POST - OP SPINAL HEADACHE, Family history of anesthesia reaction and history of anesthetic complications  Airway Mallampati: II  TM Distance: >3 FB Neck ROM: Full    Dental  (+) Dental Advidsory Given   Pulmonary neg shortness of breath, neg COPD, neg recent URI, Current SmokerPatient did not abstain from smoking.,    Pulmonary exam normal        Cardiovascular Exercise Tolerance: Good negative cardio ROS Normal cardiovascular exam     Neuro/Psych PSYCHIATRIC DISORDERS Anxiety Depression negative neurological ROS     GI/Hepatic Neg liver ROS, GERD  Poorly Controlled,  Endo/Other  diabetes, GestationalObesity, Class I, BMI 30-34.9  Insulin controlled gestational diabetes mellitus (GDM) during pregnancy, antepartum  Renal/GU negative Renal ROS  negative genitourinary   Musculoskeletal negative musculoskeletal ROS (+)   Abdominal gravid  Peds  Hematology   Anesthesia Other Findings Past Medical History: No date: Abnormal Pap smear No date: Anxiety No date: Depression No date: Family history of adverse reaction to anesthesia     Comment:  sister difficult to wake No date: GERD (gastroesophageal reflux disease) No date: Gestational diabetes No date: Hypertension     Comment:  h/o prior to pregnancy-pt has now delivered and bp is               under control-Pt will ask Dr Velora Mediate ob/gyn if she               needs to restart this No date: Spinal headache     Comment:  after epidural with section on 12-03-20-had blood patch               and ha resolved No date: Tachycardia   Reproductive/Obstetrics negative OB ROS                                                               Anesthesia Evaluation  Patient identified by MRN, date of birth, ID band Patient awake    Reviewed: Allergy & Precautions, H&P , Patient's Chart, lab work & pertinent test results  Airway Mallampati: III TM Distance: >3 FB Neck ROM: full    Dental no notable dental hx. (+) Teeth Intact   Pulmonary Current Smoker,  breath sounds clear to auscultation  Pulmonary exam normal       Cardiovascular negative cardio ROS  Rhythm:regular Rate:Normal     Neuro/Psych negative neurological ROS  negative psych ROS   GI/Hepatic Neg liver ROS, GERD-  Medicated and Controlled,  Endo/Other  negative endocrine ROS  Renal/GU negative Renal ROS  negative genitourinary   Musculoskeletal   Abdominal   Peds  Hematology negative hematology ROS (+)   Anesthesia Other Findings   Reproductive/Obstetrics (+) Pregnancy                           Anesthesia Physical Anesthesia Plan  ASA: II  Anesthesia Plan: Epidural   Post-op Pain Management:    Induction:   Airway Management Planned:  Additional Equipment:   Intra-op Plan:   Post-operative Plan:   Informed Consent: I have reviewed the patients History and Physical, chart, labs and discussed the procedure including the risks, benefits and alternatives for the proposed anesthesia with the patient or authorized representative who has indicated his/her understanding and acceptance.     Plan Discussed with: Anesthesiologist  Anesthesia Plan Comments:         Anesthesia Quick Evaluation  Anesthesia Physical  Anesthesia Plan  ASA: 2  Anesthesia Plan: General   Post-op Pain Management:    Induction: Intravenous  PONV Risk Score and Plan: 2 and Ondansetron, Dexamethasone, Midazolam and Treatment may vary due to age or medical condition  Airway Management Planned: Oral ETT  Additional  Equipment:   Intra-op Plan:   Post-operative Plan: Extubation in OR  Informed Consent: I have reviewed the patients History and Physical, chart, labs and discussed the procedure including the risks, benefits and alternatives for the proposed anesthesia with the patient or authorized representative who has indicated his/her understanding and acceptance.       Plan Discussed with: CRNA, Anesthesiologist and Surgeon  Anesthesia Plan Comments:         Anesthesia Quick Evaluation

## 2021-02-11 NOTE — Discharge Instructions (Signed)
AMBULATORY SURGERY  ?DISCHARGE INSTRUCTIONS ? ? ?The drugs that you were given will stay in your system until tomorrow so for the next 24 hours you should not: ? ?Drive an automobile ?Make any legal decisions ?Drink any alcoholic beverage ? ? ?You may resume regular meals tomorrow.  Today it is better to start with liquids and gradually work up to solid foods. ? ?You may eat anything you prefer, but it is better to start with liquids, then soup and crackers, and gradually work up to solid foods. ? ? ?Please notify your doctor immediately if you have any unusual bleeding, trouble breathing, redness and pain at the surgery site, drainage, fever, or pain not relieved by medication. ? ? ? ?Additional Instructions: ? ? ? ?Please contact your physician with any problems or Same Day Surgery at [phone], Monday through Friday 6 am to 4 pm, or [place] [facility] [place] Main number at [phone].  ?

## 2021-02-11 NOTE — Anesthesia Procedure Notes (Addendum)
Procedure Name: Intubation Date/Time: 02/11/2021 1:46 AM Performed by: Lorie Apley, CRNA Pre-anesthesia Checklist: Patient identified, Patient being monitored, Timeout performed, Emergency Drugs available and Suction available Patient Re-evaluated:Patient Re-evaluated prior to induction Oxygen Delivery Method: Circle system utilized Preoxygenation: Pre-oxygenation with 100% oxygen Induction Type: IV induction Ventilation: Mask ventilation without difficulty Laryngoscope Size: Mac, 3 and McGraph Grade View: Grade II Tube type: Oral Tube size: 7.0 mm Number of attempts: 2 (dvl x1 unsuccessful.dvl with magrath x1) Airway Equipment and Method: Stylet and Video-laryngoscopy Placement Confirmation: ETT inserted through vocal cords under direct vision, positive ETCO2 and breath sounds checked- equal and bilateral Secured at: 21 cm Tube secured with: Tape Dental Injury: Teeth and Oropharynx as per pre-operative assessment

## 2021-02-11 NOTE — Op Note (Signed)
  Operative Note   02/11/2021  PRE-OP DIAGNOSIS: Desire for permanent sterilization  POST-OP DIAGNOSIS: same   PROCEDURE: Procedure(s): LAPAROSCOPIC BILATERAL PARTIAL SALPINGECTOMY   SURGEON: Annamarie Major, MD, FACOG  ANESTHESIA: Choice   ESTIMATED BLOOD LOSS: Min  COMPLICATIONS: None  DISPOSITION: PACU - hemodynamically stable.  CONDITION: stable  FINDINGS: Laparoscopic survey of the abdomen revealed a grossly normal uterus, tubes, ovaries, liver edge, gallbladder edge and appendix, No intra-abdominal adhesions were noted.  PROCEDURE IN DETAIL: The patient was taken to the OR where anesthesia was administed. The patient was positioned in dorsal lithotomy in the Ritchey stirrups. The patient was then examined under anesthesia with the above noted findings. The patient was prepped and draped in the normal sterile fashion and bladder was drained using a red rubber cathater. Speculum exam normal, and a sponge stick was placed for manipulation purposes.  Attention was turned to the patient's abdomen where a 5 mm skin incision was made in the umbilical fold, after injection of local anesthesia. The Veress step needle was carefully introduced into the peritoneal cavity with placement confirmed using the hanging drop technique.  Pneumoperitoneum was obtained. The 5 mm port was then placed under direct visualization with the operative laparoscope  The above noted findings.  Trendelenburg.  Additional 5 mm trocars were then placed in the suprapubic region and right lower quadrant under direct visualization with the laparoscope.  Right and left fallopian tubes are identified and followed out to their fimbria.  Each tube then has a 2 cm midportion excised utilizing the Harmonic scapel.  No injuries or bleeding was noted.  All instruments and ports were then removed from the abdomen after gas was expelled and patient was leveled.   The skin was closed with skin adhesive. The patient tolerated the  procedure well. All counts were correct x 2. The patient was transferred to the recovery room awake, alert and breathing independently.  Annamarie Major, MD, Merlinda Frederick Ob/Gyn, [facility] 02/11/2021  12:32 PM

## 2021-02-11 NOTE — Transfer of Care (Signed)
Immediate Anesthesia Transfer of Care Note  Patient: Tami Foster  Procedure(s) Performed: LAPAROSCOPIC BILATERAL SALPINGECTOMY (Bilateral)  Patient Location: PACU  Anesthesia Type:General  Level of Consciousness: awake  Airway & Oxygen Therapy: Patient Spontanous Breathing and Patient connected to face mask oxygen  Post-op Assessment: Report given to RN and Post -op Vital signs reviewed and stable  Post vital signs: Reviewed and stable  Last Vitals:  Vitals Value Taken Time  BP 124/82 02/11/21 1240  Temp 36.2 C 02/11/21 1240  Pulse 102 02/11/21 1243  Resp 23 02/11/21 1243  SpO2 99 % 02/11/21 1243  Vitals shown include unvalidated device data.  Last Pain:  Vitals:   02/11/21 0624  TempSrc: Tympanic         Complications: No notable events documented.

## 2021-02-12 LAB — SURGICAL PATHOLOGY

## 2021-02-12 NOTE — Anesthesia Postprocedure Evaluation (Signed)
Anesthesia Post Note  Patient: Tami Foster  Procedure(s) Performed: LAPAROSCOPIC BILATERAL SALPINGECTOMY (Bilateral)  Patient location during evaluation: PACU Anesthesia Type: General Level of consciousness: awake and alert Pain management: pain level controlled Vital Signs Assessment: post-procedure vital signs reviewed and stable Respiratory status: spontaneous breathing, nonlabored ventilation, respiratory function stable and patient connected to nasal cannula oxygen Cardiovascular status: blood pressure returned to baseline and stable Postop Assessment: no apparent nausea or vomiting Anesthetic complications: no   No notable events documented.   Last Vitals:  Vitals:   02/11/21 1315 02/11/21 1329  BP: 126/90 (!) 144/91  Pulse: 83 82  Resp: 18 16  Temp: (!) 36.3 C (!) 36.1 C  SpO2: 96% 99%    Last Pain:  Vitals:   02/11/21 1329  TempSrc: Temporal  PainSc: 3                  Lenard Simmer

## 2021-02-22 ENCOUNTER — Other Ambulatory Visit: Payer: Self-pay | Admitting: Obstetrics & Gynecology

## 2021-02-22 MED ORDER — FLUCONAZOLE 150 MG PO TABS: 150.0000 mg | ORAL_TABLET | Freq: Once | ORAL | 3 refills | Status: AC

## 2021-02-22 NOTE — Telephone Encounter (Signed)
Can you send in her a diflucan?

## 2021-02-24 ENCOUNTER — Ambulatory Visit (INDEPENDENT_AMBULATORY_CARE_PROVIDER_SITE_OTHER): Payer: Medicaid Other | Admitting: Obstetrics & Gynecology

## 2021-02-24 ENCOUNTER — Other Ambulatory Visit: Payer: Self-pay

## 2021-02-24 ENCOUNTER — Encounter: Payer: Self-pay | Admitting: Obstetrics & Gynecology

## 2021-02-24 VITALS — BP 122/74 | Ht 62.0 in | Wt 153.0 lb

## 2021-02-24 DIAGNOSIS — Z9851 Tubal ligation status: Secondary | ICD-10-CM

## 2021-02-24 NOTE — Progress Notes (Signed)
  Postoperative Follow-up Patient presents post op from laparoscopic salpingectomy for requested sterilization, 2 weeks ago. Path: DIAGNOSIS:  A. FALLOPIAN TUBE SEGMENT, RIGHT; STERILIZATION:  - FALLOPIAN TUBE WITH FULL CROSS-SECTION OF THE LUMEN EXAMINED.   B.  FALLOPIAN TUBE SEGMENT, LEFT; STERILIZATION:  - FALLOPIAN TUBE WITH FULL CROSS-SECTION OF THE LUMEN EXAMINED.  Subjective: Patient reports marked improvement in her preop symptoms. Eating a regular diet without difficulty. The patient is not having any pain.  Activity: normal activities of daily living. Patient reports additional symptom's since surgery of None.  Objective: BP 122/74   Ht 5\' 2"  (1.575 m)   Wt 153 lb (69.4 kg)   LMP 02/05/2021 (Approximate)   BMI 27.98 kg/m  Physical Exam Constitutional:      General: She is not in acute distress.    Appearance: She is well-developed.  Cardiovascular:     Rate and Rhythm: Normal rate.  Pulmonary:     Effort: Pulmonary effort is normal.  Abdominal:     General: There is no distension.     Palpations: Abdomen is soft.     Tenderness: There is no abdominal tenderness.     Comments: Incision Healing Well   Musculoskeletal:        General: Normal range of motion.  Neurological:     Mental Status: She is alert and oriented to person, place, and time.     Cranial Nerves: No cranial nerve deficit.  Skin:    General: Skin is warm and dry.    Assessment: s/p :  laparoscopy and Tubal  progressing well  Plan: Patient has done well after surgery with no apparent complications.  I have discussed the post-operative course to date, and the expected progress moving forward.  The patient understands what complications to be concerned about.  I will see the patient in routine follow up, or sooner if needed.    Activity plan: No restriction.  02/07/2021 02/24/2021, 11:20 AM

## 2021-03-18 ENCOUNTER — Other Ambulatory Visit: Payer: Self-pay

## 2021-03-18 MED ORDER — ALPRAZOLAM 1 MG PO TABS: 1.0000 mg | ORAL_TABLET | Freq: Every evening | ORAL | 5 refills | Status: DC | PRN

## 2021-05-05 ENCOUNTER — Encounter: Payer: Self-pay | Admitting: Obstetrics & Gynecology

## 2021-05-05 NOTE — Telephone Encounter (Signed)
Please advise 

## 2021-05-06 ENCOUNTER — Other Ambulatory Visit: Payer: Self-pay | Admitting: Obstetrics & Gynecology

## 2021-05-06 MED ORDER — FLUCONAZOLE 150 MG PO TABS: 150.0000 mg | ORAL_TABLET | Freq: Once | ORAL | 0 refills | Status: AC

## 2021-05-30 ENCOUNTER — Encounter: Payer: Self-pay | Admitting: Obstetrics & Gynecology

## 2021-05-31 ENCOUNTER — Other Ambulatory Visit: Payer: Self-pay | Admitting: Obstetrics & Gynecology

## 2021-05-31 DIAGNOSIS — L680 Hirsutism: Secondary | ICD-10-CM

## 2021-05-31 DIAGNOSIS — L679 Hair color and hair shaft abnormality, unspecified: Secondary | ICD-10-CM

## 2021-05-31 DIAGNOSIS — E282 Polycystic ovarian syndrome: Secondary | ICD-10-CM

## 2021-06-01 ENCOUNTER — Other Ambulatory Visit: Payer: Medicaid Other

## 2021-06-01 ENCOUNTER — Other Ambulatory Visit: Payer: Self-pay

## 2021-06-01 DIAGNOSIS — L679 Hair color and hair shaft abnormality, unspecified: Secondary | ICD-10-CM | POA: Diagnosis not present

## 2021-06-01 DIAGNOSIS — E282 Polycystic ovarian syndrome: Secondary | ICD-10-CM | POA: Diagnosis not present

## 2021-06-05 LAB — TESTOSTERONE, FREE, TOTAL, SHBG
Sex Hormone Binding: 38.5 nmol/L (ref 24.6–122.0)
Testosterone, Free: 2.6 pg/mL (ref 0.0–4.2)
Testosterone: 46 ng/dL (ref 8–60)

## 2021-06-05 LAB — FSH/LH
FSH: 6.5 m[IU]/mL
LH: 7.9 m[IU]/mL

## 2021-06-05 LAB — TSH: TSH: 1.16 u[IU]/mL (ref 0.450–4.500)

## 2021-06-07 ENCOUNTER — Encounter: Payer: Self-pay | Admitting: Obstetrics & Gynecology

## 2021-07-03 IMAGING — US US MFM FETAL BPP W/O NON-STRESS
1 series · 12 of 17 positions shown · non-contrast
Comparison: none

[Series 1: us mfm fetal bpp w/o non-stress · 17 acquisitions, 12 frames shown]
[im 1/17]
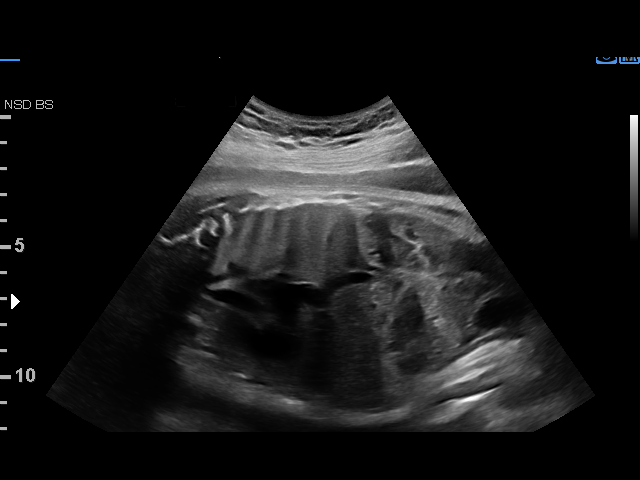
[im 3/17]
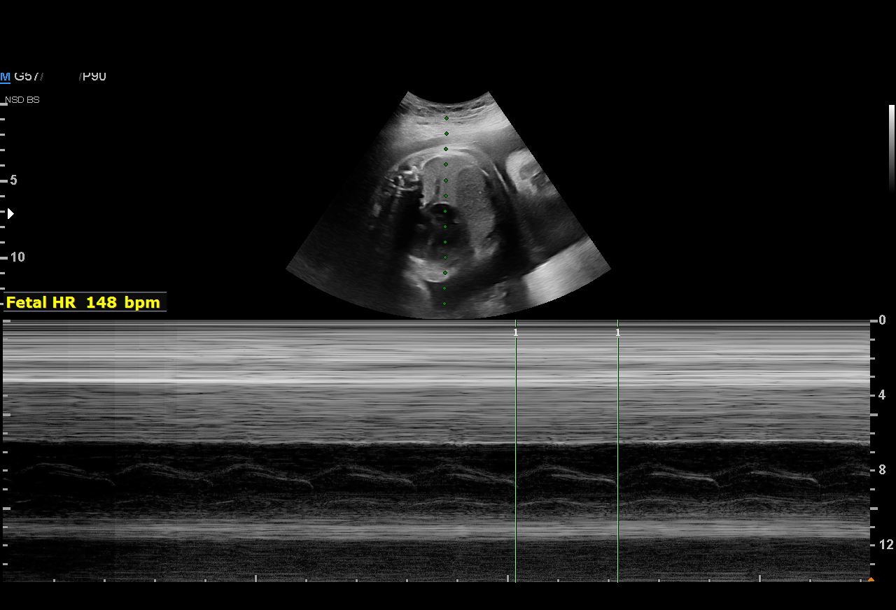
[im 4/17]
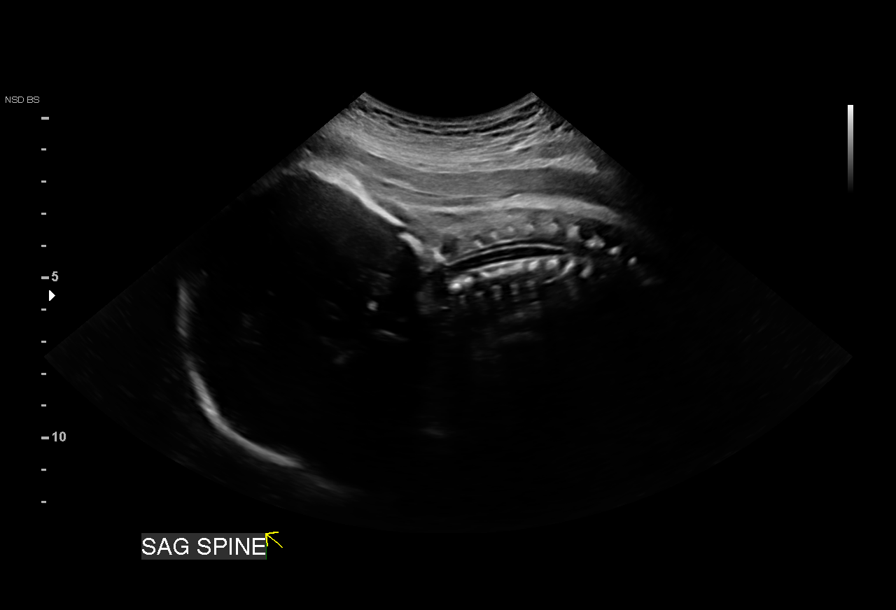
[im 5/17]
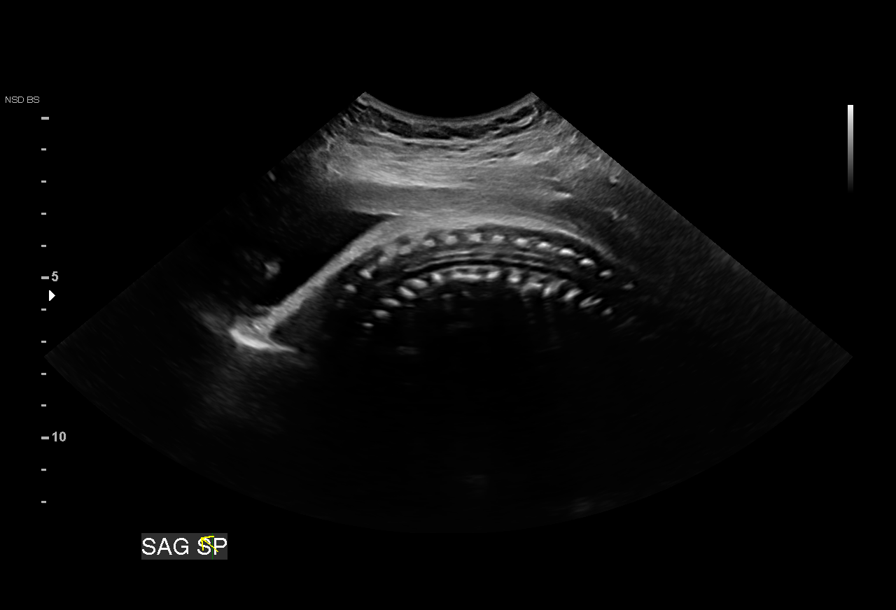
[im 7/17]
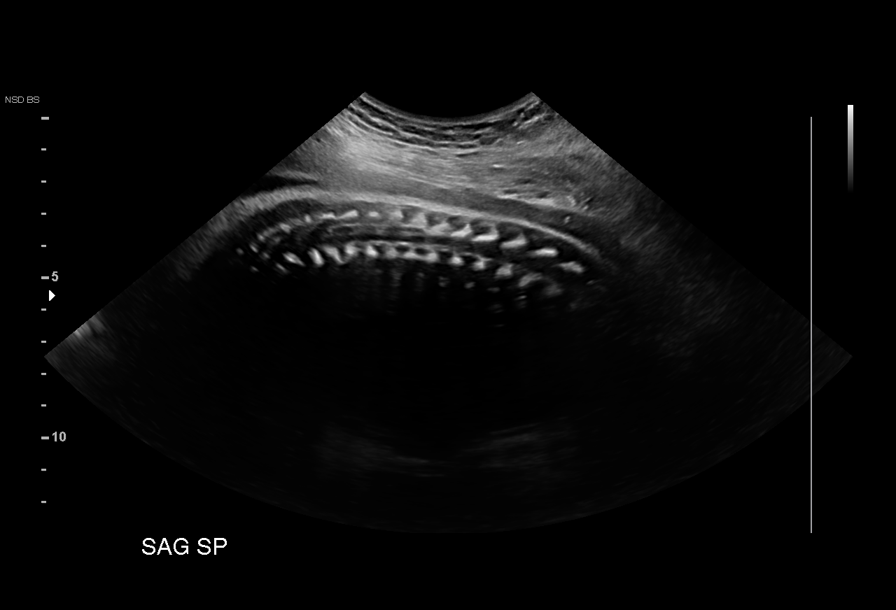
[im 8/17]
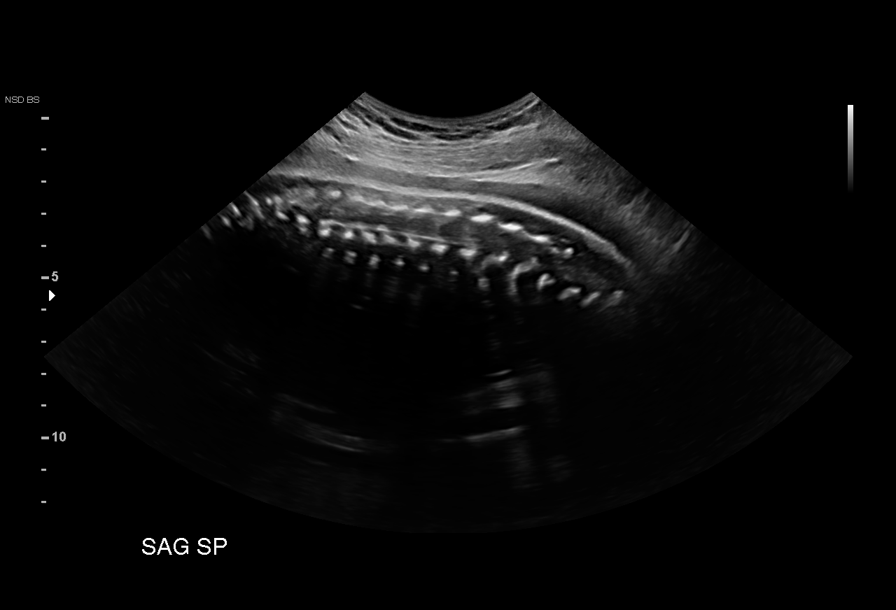
[im 10/17]
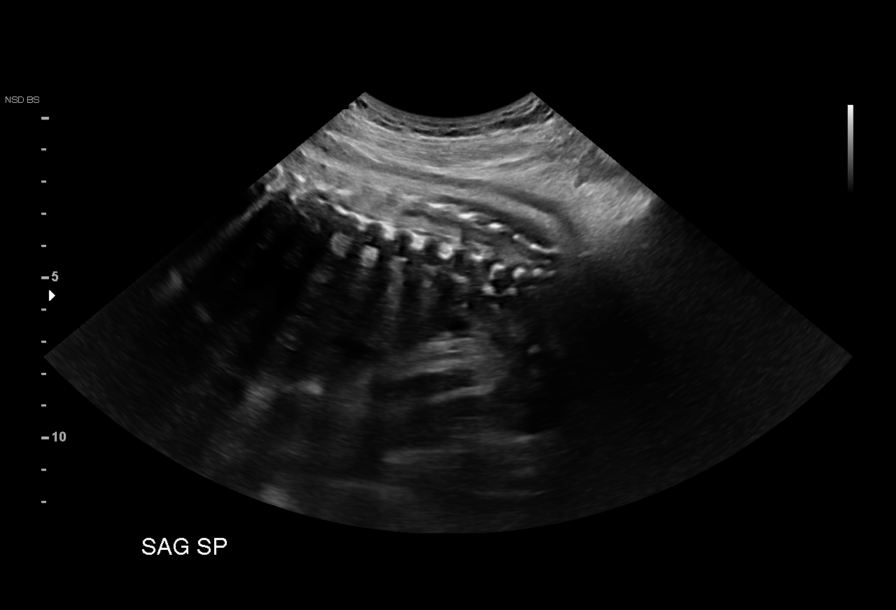
[im 11/17]
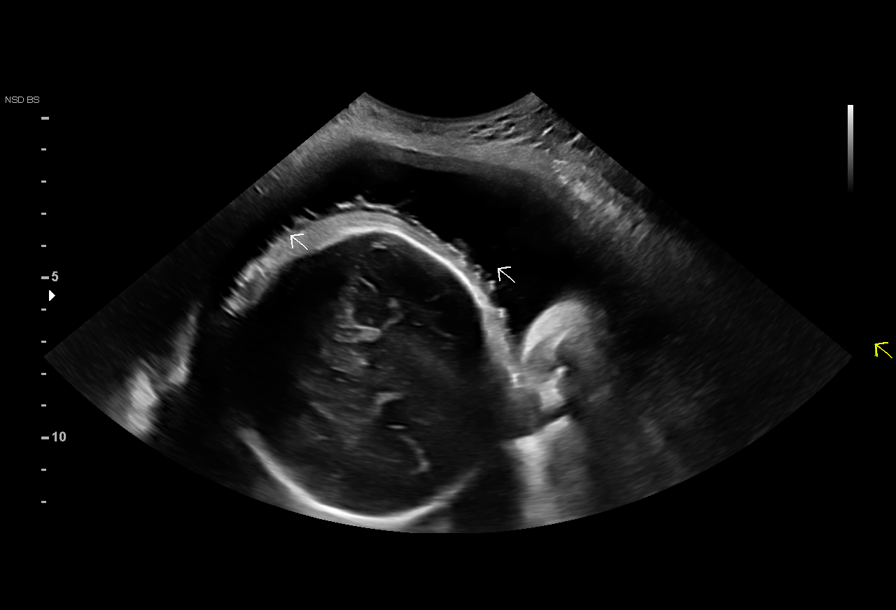
[im 13/17]
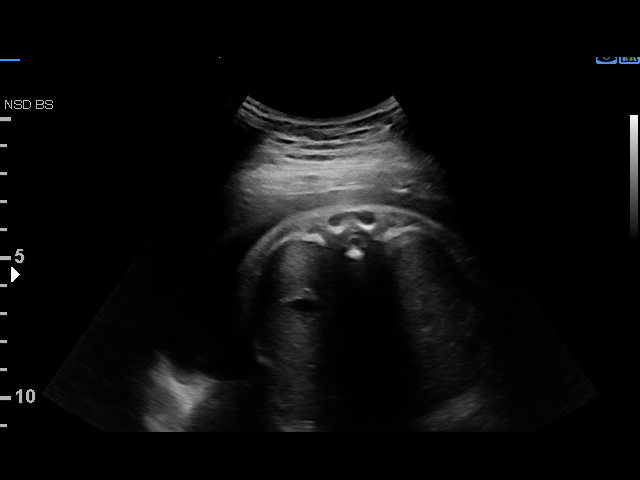
[im 14/17]
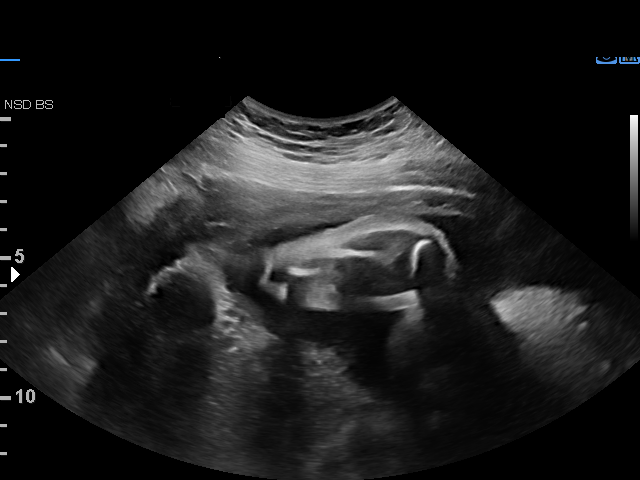
[im 15/17]
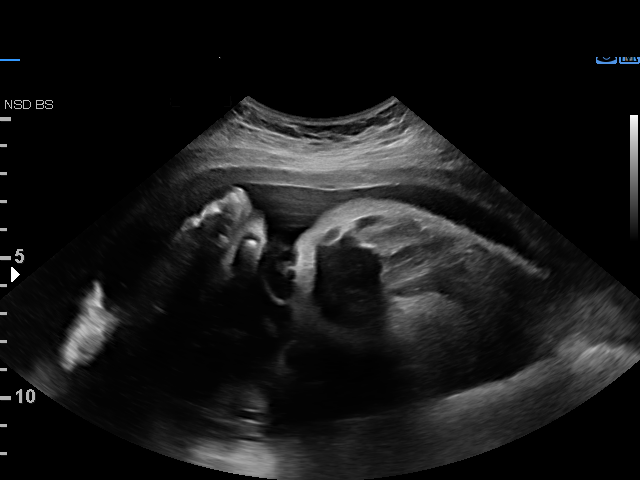
[im 17/17]
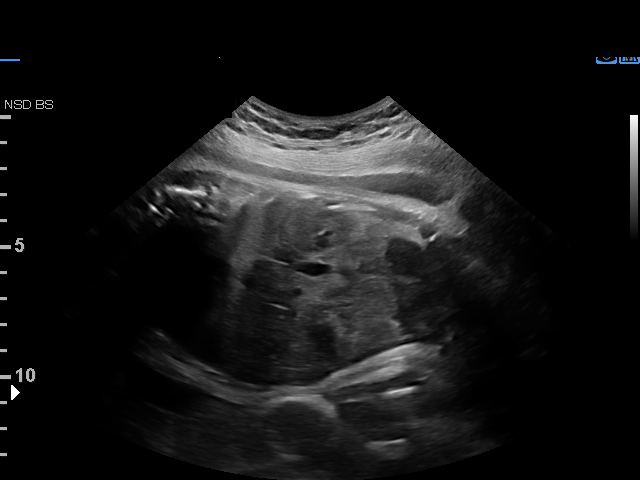

[12 of 17 positions shown; findings below may reference images not displayed]

Indications

 Advanced maternal age multigravida 35+,
 third trimester (39)
 32 weeks gestation of pregnancy
 Gestational diabetes in pregnancy, insulin
 controlled
 Antenatal follow-up for nonvisualized fetal
 anatomy
 Previous cervical surgery (LEEP)
 Poor obstetrical history (PPROM @ 34
 weeks)
 Hypertension - Chronic/Pre-existing (no
 meds)
 Tobacco use complicating pregnancy
 Obesity complicating pregnancy, third
 trimester (BMI 31)
 Maternal tachycardia (Cardio referral)
 LOW risk NIPS
Fetal Evaluation

 Num Of Fetuses:         1
 Fetal Heart Rate(bpm):  148
 Cardiac Activity:       Observed
 Presentation:           Breech
 Placenta:               Posterior
 P. Cord Insertion:      Previously Visualized

 Amniotic Fluid
 AFI FV:      Within normal limits

 AFI Sum(cm)     %Tile       Largest Pocket(cm)
 19.7            74
 RUQ(cm)       RLQ(cm)       LUQ(cm)        LLQ(cm)

Biophysical Evaluation

 Amniotic F.V:   Within normal limits       F. Tone:        Observed
 F. Movement:    Observed                   Score:          [DATE]
 F. Breathing:   Observed
OB History

 Gravidity:    3         Term:   0        Prem:   1        SAB:   1
 TOP:          0       Ectopic:  0        Living: 1
Gestational Age

 Best:          32w 2d     Det. By:  Previous Ultrasound      EDD:   12/21/20
Impression

 Antenatal testing performed given maternal 8SM77, chronic
 hypertension no meds.
 The biophysical profile was [DATE] with good fetal movement and
 amniotic fluid volume.

 Blood pressure is stable today 135/83.
Recommendations

 Continue weekly testing.

## 2021-09-30 ENCOUNTER — Ambulatory Visit (INDEPENDENT_AMBULATORY_CARE_PROVIDER_SITE_OTHER): Payer: Medicaid Other | Admitting: Nurse Practitioner

## 2021-09-30 ENCOUNTER — Encounter: Payer: Self-pay | Admitting: Nurse Practitioner

## 2021-09-30 VITALS — BP 126/76 | HR 100 | Temp 98.0°F | Ht 62.0 in | Wt 155.4 lb

## 2021-09-30 DIAGNOSIS — Z0000 Encounter for general adult medical examination without abnormal findings: Secondary | ICD-10-CM

## 2021-09-30 DIAGNOSIS — R5383 Other fatigue: Secondary | ICD-10-CM

## 2021-09-30 DIAGNOSIS — F411 Generalized anxiety disorder: Secondary | ICD-10-CM | POA: Diagnosis not present

## 2021-09-30 DIAGNOSIS — R000 Tachycardia, unspecified: Secondary | ICD-10-CM | POA: Diagnosis not present

## 2021-09-30 DIAGNOSIS — L659 Nonscarring hair loss, unspecified: Secondary | ICD-10-CM

## 2021-09-30 DIAGNOSIS — R002 Palpitations: Secondary | ICD-10-CM | POA: Diagnosis not present

## 2021-09-30 DIAGNOSIS — F410 Panic disorder [episodic paroxysmal anxiety] without agoraphobia: Secondary | ICD-10-CM | POA: Diagnosis not present

## 2021-09-30 DIAGNOSIS — Z8632 Personal history of gestational diabetes: Secondary | ICD-10-CM

## 2021-09-30 MED ORDER — METOPROLOL SUCCINATE ER 50 MG PO TB24: 50.0000 mg | ORAL_TABLET | Freq: Every day | ORAL | 3 refills | Status: DC

## 2021-09-30 MED ORDER — ALPRAZOLAM 1 MG PO TABS: 1.0000 mg | ORAL_TABLET | Freq: Every evening | ORAL | 1 refills | Status: DC | PRN

## 2021-09-30 MED ORDER — VENLAFAXINE HCL ER 37.5 MG PO CP24: 37.5000 mg | ORAL_CAPSULE | Freq: Every day | ORAL | 1 refills | Status: DC

## 2021-09-30 NOTE — Progress Notes (Signed)
New Patient Office Visit  Subjective    Patient ID: Tami Foster, female    DOB: [birth year]  Age: 41 y.o. MRN: [id number]  CC:  Chief Complaint  Patient presents with   New Patient (Initial Visit)    HPI Tami Foster presents to establish care The patient states that she has been seeing OB since pregnancy. Did have gestational diabetes.  Has not had this checked since right after having her baby who is now 68 months old.  -noted hair falling out.  -increased palpitations and increased heart rate. Sometimes causes her shortness of breath. Does see cardiology provider for this. She is supposed to be taking metoprolol 100mg  twice daily.  -currently on effexor 75mg  daily. Having panic attacks. Will wake up short of breath and drenched in sweat.  She has been out of effexor for some time. Has been out for a few weeks. She will take alprazolam 1mg  at bedtime if needed to help with anxiety. The patient is a current, everyday smoker.    Outpatient Encounter Medications as of 09/30/2021  Medication Sig   metoprolol succinate (TOPROL-XL) 50 MG 24 hr tablet Take 1 tablet (50 mg total) by mouth daily. Take with or immediately following a meal.   venlafaxine XR (EFFEXOR XR) 37.5 MG 24 hr capsule Take 1 capsule (37.5 mg total) by mouth daily with breakfast.   [DISCONTINUED] ALPRAZolam (XANAX) 1 MG tablet Take 1 tablet (1 mg total) by mouth at bedtime as needed for anxiety.   ALPRAZolam (XANAX) 1 MG tablet Take 1 tablet (1 mg total) by mouth at bedtime as needed for anxiety.   [DISCONTINUED] ibuprofen (ADVIL) 600 MG tablet TAKE 1 TABLET BY MOUTH EVERY 6 HOURS (Patient not taking: Reported on 02/24/2021)   [DISCONTINUED] insulin glargine (LANTUS SOLOSTAR) 100 UNIT/ML Solostar Pen Inject 10 Units into the skin 2 (two) times daily.   [DISCONTINUED] venlafaxine XR (EFFEXOR XR) 75 MG 24 hr capsule Take 1 capsule (75 mg total) by mouth daily for 4 days, THEN 2 capsules (150 mg total) daily.  (Patient taking differently: Take 1 capsule (75 mg total) by mouth daily for 4 days, THEN 2 capsules (150 mg total) daily.-AM)   No facility-administered encounter medications on file as of 09/30/2021.    Past Medical History:  Diagnosis Date   Abnormal Pap smear    Anxiety    Depression    Family history of adverse reaction to anesthesia    sister difficult to wake   GERD (gastroesophageal reflux disease)    Gestational diabetes    Hypertension    h/o prior to pregnancy-pt has now delivered and bp is under control-Pt will ask Dr 10/02/2021 ob/gyn if she needs to restart this   Spinal headache    after epidural with section on 12-03-20-had blood patch and ha resolved   Tachycardia     Past Surgical History:  Procedure Laterality Date   CESAREAN SECTION N/A 12/03/2020   Procedure: CESAREAN SECTION;  Surgeon: Velora Mediate, MD;  Location: [facility] ORS;  Service: Obstetrics;  Laterality: N/A;   LAPAROSCOPIC BILATERAL SALPINGECTOMY Bilateral 02/11/2021   Procedure: LAPAROSCOPIC BILATERAL SALPINGECTOMY;  Surgeon: 12/05/2020, MD;  Location: [facility] ORS;  Service: Gynecology;  Laterality: Bilateral;   LEEP     PERIURETHRAL ABSCESS DRAINAGE     abscess removed from tonsils. Tonsils not removed   WISDOM TOOTH EXTRACTION      Family History  Problem Relation Age of Onset   Cancer Mother  bladder   Hypertension Mother    Emphysema Father    Hypertension Father    Cancer Father 56       Colon   Diabetes Paternal Grandmother    Other Neg Hx     Social History   Socioeconomic History   Marital status: Married    Spouse name: Not on file   Number of children: Not on file   Years of education: Not on file   Highest education level: Not on file  Occupational History   Not on file  Tobacco Use   Smoking status: Every Day    Packs/day: 0.50    Years: 14.00    Pack years: 7.00    Types: Cigarettes   Smokeless tobacco: Never  Vaping Use   Vaping Use: Former    Substances: Nicotine, Flavoring  Substance and Sexual Activity   Alcohol use: Yes    Comment: occ   Drug use: No   Sexual activity: Yes    Partners: Male    Birth control/protection: None    Comment: 1st intercourse- 19, partners- 6  Other Topics Concern   Not on file  Social History Narrative   Not on file   Social Determinants of Health   Financial Resource Strain: Not on file  Food Insecurity: Not on file  Transportation Needs: Not on file  Physical Activity: Not on file  Stress: Not on file  Social Connections: Not on file  Intimate Partner Violence: Not on file    Review of Systems  Constitutional:  Positive for malaise/fatigue. Negative for chills and fever.  HENT:  Negative for congestion, sinus pain and sore throat.   Eyes: Negative.   Respiratory:  Positive for shortness of breath. Negative for cough and wheezing.   Cardiovascular:  Positive for palpitations and orthopnea. Negative for chest pain and leg swelling.  Gastrointestinal:  Negative for constipation, diarrhea, nausea and vomiting.  Genitourinary: Negative.   Musculoskeletal:  Negative for myalgias.  Skin: Negative.        Reports rapid hair loss   Neurological:  Negative for dizziness and headaches.  Endo/Heme/Allergies:  Does not bruise/bleed easily.       Patient had gestational diabetes   Psychiatric/Behavioral:  Positive for depression. The patient is nervous/anxious.        Objective    Today's Vitals   09/30/21 0841  BP: 126/76  Pulse: 100  Temp: 98 F (36.7 C)  SpO2: 100%  Weight: 155 lb 6.4 oz (70.5 kg)   Body mass index is 28.42 kg/m.   Physical Exam Vitals and nursing note reviewed.  Constitutional:      Appearance: Normal appearance. She is well-developed.  HENT:     Head: Normocephalic and atraumatic.     Nose: Nose normal.     Mouth/Throat:     Mouth: Mucous membranes are moist.     Pharynx: Oropharynx is clear.  Eyes:     Extraocular Movements: Extraocular movements  intact.     Conjunctiva/sclera: Conjunctivae normal.     Pupils: Pupils are equal, round, and reactive to light.  Cardiovascular:     Rate and Rhythm: Normal rate and regular rhythm.     Pulses: Normal pulses.     Heart sounds: Normal heart sounds.  Pulmonary:     Effort: Pulmonary effort is normal.     Breath sounds: Normal breath sounds.  Abdominal:     Palpations: Abdomen is soft.  Musculoskeletal:        General:  Normal range of motion.     Cervical back: Normal range of motion and neck supple.  Lymphadenopathy:     Cervical: No cervical adenopathy.  Skin:    General: Skin is warm and dry.     Capillary Refill: Capillary refill takes less than 2 seconds.  Neurological:     General: No focal deficit present.     Mental Status: She is alert and oriented to person, place, and time.  Psychiatric:        Attention and Perception: Attention and perception normal.        Mood and Affect: Affect normal. Mood is anxious.        Speech: Speech normal.        Behavior: Behavior normal. Behavior is cooperative.        Thought Content: Thought content normal.        Cognition and Memory: Cognition and memory normal.        Judgment: Judgment normal.      Assessment & Plan:  1. Sinus tachycardia The patient has history of tachycardia with PVCs. She does see cardiology. Will restart toprol XL 50 mg daily. Check blood work. Recommend she contact cardiology for continued management.  - CBC with Differential/Platelet; Future - CBC with Differential/Platelet - metoprolol succinate (TOPROL-XL) 50 MG 24 hr tablet; Take 1 tablet (50 mg total) by mouth daily. Take with or immediately following a meal.  Dispense: 90 tablet; Refill: 3  2. Palpitations Restart toprol XL 50 mg daily. Check labs. Recommend patient contact her cardiologist for further evaluation and management.  - CBC with Differential/Platelet; Future - CBC with Differential/Platelet - metoprolol succinate (TOPROL-XL) 50 MG 24  hr tablet; Take 1 tablet (50 mg total) by mouth daily. Take with or immediately following a meal.  Dispense: 90 tablet; Refill: 3  3. Other fatigue Check labs including thyroid panel, CBC, and CMP.  - Comprehensive metabolic panel; Future - T4, free; Future - TSH; Future - TSH - T4, free - Comprehensive metabolic panel  4. Alopecia Check thyroid panel.  - Comprehensive metabolic panel; Future - T4, free; Future - TSH; Future - TSH - T4, free - Comprehensive metabolic panel  5. Panic attack Restart effexor at 37.5mg  daily. May take alprazolam 0.5 to 1 mg at bedtime as needed. The patient [facility]   6. Generalized anxiety disorder Restart effexor at 37.5mg  daily. May take alprazolam 0.5 to 1 mg at bedtime as needed. The patient did sign [facility]  - venlafaxine XR (EFFEXOR XR) 37.5 MG 24 hr capsule; Take 1 capsule (37.5 mg total) by mouth daily with breakfast.  Dispense: 30 capsule; Refill: 1 - ALPRAZolam (XANAX) 1 MG tablet; Take 1 tablet (1 mg total) by mouth at bedtime as needed for anxiety.  Dispense: 30 tablet; Refill: 1  7. History of gestational diabetes Check HgbA1c  - Hemoglobin A1c; Future - Hemoglobin A1c  8. Healthcare maintenance Check fasting lipid panel today.  - Lipid panel; Future - Lipid panel    Problem List Items Addressed This Visit       Musculoskeletal and Integument   Alopecia   Relevant Orders   Comprehensive metabolic panel   T4, free   TSH     Other   Other fatigue   Relevant Orders   Comprehensive metabolic panel   T4, free   TSH  Sinus tachycardia - Primary   Relevant Medications   metoprolol succinate (TOPROL-XL) 50 MG 24 hr tablet   Other Relevant Orders   CBC with Differential/Platelet   Palpitations   Relevant Medications   metoprolol succinate (TOPROL-XL) 50 MG 24 hr tablet    Other Relevant Orders   CBC with Differential/Platelet   Panic attack   Relevant Medications   venlafaxine XR (EFFEXOR XR) 37.5 MG 24 hr capsule   ALPRAZolam (XANAX) 1 MG tablet   Generalized anxiety disorder   Relevant Medications   venlafaxine XR (EFFEXOR XR) 37.5 MG 24 hr capsule   ALPRAZolam (XANAX) 1 MG tablet   History of gestational diabetes   Relevant Orders   Hemoglobin A1c   Other Visit Diagnoses     Healthcare maintenance       Relevant Orders   Lipid panel       Return in about 2 weeks (around 10/14/2021) for heart rate, mood, review labs .   Carlean Jews, NP

## 2021-10-01 LAB — CBC WITH DIFFERENTIAL/PLATELET
Basophils Absolute: 0.1 10*3/uL (ref 0.0–0.2)
Basos: 1 %
EOS (ABSOLUTE): 0.1 10*3/uL (ref 0.0–0.4)
Eos: 1 %
Hematocrit: 46.1 % (ref 34.0–46.6)
Hemoglobin: 16.3 g/dL — ABNORMAL HIGH (ref 11.1–15.9)
Immature Grans (Abs): 0 10*3/uL (ref 0.0–0.1)
Immature Granulocytes: 0 %
Lymphocytes Absolute: 2.4 10*3/uL (ref 0.7–3.1)
Lymphs: 24 %
MCH: 32.7 pg (ref 26.6–33.0)
MCHC: 35.4 g/dL (ref 31.5–35.7)
MCV: 93 fL (ref 79–97)
Monocytes Absolute: 0.6 10*3/uL (ref 0.1–0.9)
Monocytes: 6 %
Neutrophils Absolute: 6.9 10*3/uL (ref 1.4–7.0)
Neutrophils: 68 %
Platelets: 210 10*3/uL (ref 150–450)
RBC: 4.98 x10E6/uL (ref 3.77–5.28)
RDW: 11.9 % (ref 11.7–15.4)
WBC: 10.1 10*3/uL (ref 3.4–10.8)

## 2021-10-01 LAB — LIPID PANEL
Chol/HDL Ratio: 3.1 ratio (ref 0.0–4.4)
Cholesterol, Total: 208 mg/dL — ABNORMAL HIGH (ref 100–199)
HDL: 68 mg/dL (ref >39)
LDL Chol Calc (NIH): 127 mg/dL — ABNORMAL HIGH (ref 0–99)
Triglycerides: 73 mg/dL (ref 0–149)
VLDL Cholesterol Cal: 13 mg/dL (ref 5–40)

## 2021-10-01 LAB — COMPREHENSIVE METABOLIC PANEL
ALT: 15 IU/L (ref 0–32)
AST: 15 IU/L (ref 0–40)
Albumin/Globulin Ratio: 1.8 (ref 1.2–2.2)
Albumin: 4.8 g/dL (ref 3.8–4.8)
Alkaline Phosphatase: 99 IU/L (ref 44–121)
BUN/Creatinine Ratio: 21 (ref 9–23)
BUN: 14 mg/dL (ref 6–20)
Bilirubin Total: 0.4 mg/dL (ref 0.0–1.2)
CO2: 21 mmol/L (ref 20–29)
Calcium: 9.6 mg/dL (ref 8.7–10.2)
Chloride: 101 mmol/L (ref 96–106)
Creatinine, Ser: 0.66 mg/dL (ref 0.57–1.00)
Globulin, Total: 2.6 g/dL (ref 1.5–4.5)
Glucose: 99 mg/dL (ref 70–99)
Potassium: 4.3 mmol/L (ref 3.5–5.2)
Sodium: 138 mmol/L (ref 134–144)
Total Protein: 7.4 g/dL (ref 6.0–8.5)
eGFR: 115 mL/min/{1.73_m2} (ref >59)

## 2021-10-01 LAB — HEMOGLOBIN A1C
Est. average glucose Bld gHb Est-mCnc: 105 mg/dL
Hgb A1c MFr Bld: 5.3 % (ref 4.8–5.6)

## 2021-10-01 LAB — T4, FREE: Free T4: 1.41 ng/dL (ref 0.82–1.77)

## 2021-10-01 LAB — TSH: TSH: 0.659 u[IU]/mL (ref 0.450–4.500)

## 2021-10-05 NOTE — Progress Notes (Signed)
Mild elevation of LDL and total cholesterol. All other labs are good. Discuss at visit 10/12/2021.

## 2021-10-12 ENCOUNTER — Ambulatory Visit (INDEPENDENT_AMBULATORY_CARE_PROVIDER_SITE_OTHER): Payer: Medicaid Other | Admitting: Nurse Practitioner

## 2021-10-12 ENCOUNTER — Encounter: Payer: Self-pay | Admitting: Nurse Practitioner

## 2021-10-12 VITALS — BP 119/76 | HR 79 | Temp 98.2°F | Ht 61.81 in | Wt 152.8 lb

## 2021-10-12 DIAGNOSIS — E785 Hyperlipidemia, unspecified: Secondary | ICD-10-CM

## 2021-10-12 DIAGNOSIS — F411 Generalized anxiety disorder: Secondary | ICD-10-CM

## 2021-10-12 DIAGNOSIS — E559 Vitamin D deficiency, unspecified: Secondary | ICD-10-CM

## 2021-10-12 DIAGNOSIS — R000 Tachycardia, unspecified: Secondary | ICD-10-CM

## 2021-10-12 MED ORDER — BUPROPION HCL ER (SR) 100 MG PO TB12: ORAL_TABLET | ORAL | 1 refills | Status: DC

## 2021-10-12 NOTE — Progress Notes (Signed)
Established patient visit   Patient: Tami Foster   DOB: [birth year]   41 y.o. Female  MRN: [id number] Visit Date: 10/12/2021   Chief Complaint  Patient presents with   Follow-up   Subjective    HPI  Patient was restarted on Effexor at 37.5 mg tablets at her last visit.  She states this is helped a great deal with her anxiety and depression.  Has noticed some sexual dysfunction.  Has been unable to achieve orgasm with her husband since starting Effexor. -Reports rarely needing to use previously prescribed alprazolam. -Heart rate stable and she reports no palpitations, chest pain or chest pressure. -No new concerns or complaints today. -routine, fasting ;abs drawn since her last visit.  --mild elevation of LDL and total cholesterol  --vitamin d deficiency   Medications: Outpatient Medications Prior to Visit  Medication Sig   ALPRAZolam (XANAX) 1 MG tablet Take 1 tablet (1 mg total) by mouth at bedtime as needed for anxiety.   metoprolol succinate (TOPROL-XL) 50 MG 24 hr tablet Take 1 tablet (50 mg total) by mouth daily. Take with or immediately following a meal.   venlafaxine XR (EFFEXOR XR) 37.5 MG 24 hr capsule Take 1 capsule (37.5 mg total) by mouth daily with breakfast.   No facility-administered medications prior to visit.    Review of Systems  Constitutional:  Negative for activity change, appetite change, chills, fatigue and fever.  HENT:  Negative for congestion, postnasal drip, rhinorrhea, sinus pressure, sinus pain, sneezing and sore throat.   Eyes: Negative.   Respiratory:  Negative for cough, chest tightness, shortness of breath and wheezing.   Cardiovascular:  Negative for chest pain and palpitations.  Gastrointestinal:  Negative for abdominal pain, constipation, diarrhea, nausea and vomiting.  Endocrine: Negative for cold intolerance, heat intolerance, polydipsia and polyuria.  Genitourinary:  Negative for dyspareunia, dysuria, flank pain, frequency and  urgency.  Musculoskeletal:  Negative for arthralgias, back pain and myalgias.  Skin:  Negative for rash.  Allergic/Immunologic: Negative for environmental allergies.  Neurological:  Negative for dizziness, weakness and headaches.  Hematological:  Negative for adenopathy.  Psychiatric/Behavioral:  Positive for sleep disturbance. The patient is nervous/anxious.        Improved since recent visit.    Last CBC Lab Results  Component Value Date   WBC 10.1 09/30/2021   HGB 16.3 (H) 09/30/2021   HCT 46.1 09/30/2021   MCV 93 09/30/2021   MCH 32.7 09/30/2021   RDW 11.9 09/30/2021   PLT 210 02/77/4128   Last metabolic panel Lab Results  Component Value Date   GLUCOSE 99 09/30/2021   NA 138 09/30/2021   K 4.3 09/30/2021   CL 101 09/30/2021   CO2 21 09/30/2021   BUN 14 09/30/2021   CREATININE 0.66 09/30/2021   EGFR 115 09/30/2021   CALCIUM 9.6 09/30/2021   PROT 7.4 09/30/2021   ALBUMIN 4.8 09/30/2021   LABGLOB 2.6 09/30/2021   AGRATIO 1.8 09/30/2021   BILITOT 0.4 09/30/2021   ALKPHOS 99 09/30/2021   AST 15 09/30/2021   ALT 15 09/30/2021   ANIONGAP 8 05/04/2020   Last lipids Lab Results  Component Value Date   CHOL 208 (H) 09/30/2021   HDL 68 09/30/2021   LDLCALC 127 (H) 09/30/2021   TRIG 73 09/30/2021   CHOLHDL 3.1 09/30/2021   Last hemoglobin A1c Lab Results  Component Value Date   HGBA1C 5.3 09/30/2021   Last thyroid functions Lab Results  Component Value Date   TSH 0.659 09/30/2021  T3TOTAL 97.9 07/01/2013   Last vitamin D Lab Results  Component Value Date   VD25OH 22.15 (L) 09/22/2017       Objective     Today's Vitals   10/12/21 1119  BP: 119/76  Pulse: 79  Temp: 98.2 F (36.8 C)  SpO2: 99%  Weight: 152 lb 12.8 oz (69.3 kg)  Height: 5' 1.81" (1.57 m)   Body mass index is 28.12 kg/m.   BP Readings from Last 3 Encounters:  10/12/21 119/76  09/30/21 126/76  02/24/21 122/74    Wt Readings from Last 3 Encounters:  10/12/21 152 lb 12.8  oz (69.3 kg)  09/30/21 155 lb 6.4 oz (70.5 kg)  02/24/21 153 lb (69.4 kg)    Physical Exam Vitals and nursing note reviewed.  Constitutional:      Appearance: Normal appearance. She is well-developed.  HENT:     Head: Normocephalic and atraumatic.     Nose: Nose normal.     Mouth/Throat:     Mouth: Mucous membranes are moist.     Pharynx: Oropharynx is clear.  Eyes:     Extraocular Movements: Extraocular movements intact.     Conjunctiva/sclera: Conjunctivae normal.     Pupils: Pupils are equal, round, and reactive to light.  Cardiovascular:     Rate and Rhythm: Normal rate and regular rhythm.     Pulses: Normal pulses.     Heart sounds: Normal heart sounds.  Pulmonary:     Effort: Pulmonary effort is normal.     Breath sounds: Normal breath sounds.  Abdominal:     Palpations: Abdomen is soft.  Musculoskeletal:        General: Normal range of motion.     Cervical back: Normal range of motion and neck supple.  Lymphadenopathy:     Cervical: No cervical adenopathy.  Skin:    General: Skin is warm and dry.     Capillary Refill: Capillary refill takes less than 2 seconds.  Neurological:     General: No focal deficit present.     Mental Status: She is alert and oriented to person, place, and time.  Psychiatric:        Mood and Affect: Mood normal.        Behavior: Behavior normal.        Thought Content: Thought content normal.        Judgment: Judgment normal.      Assessment & Plan    1. Generalized anxiety disorder Improved.  Continue Effexor 37.5 mg daily.  We will add bupropion ER 100 mg daily to help with mild sexual dysfunction.  May increase this to twice daily as needed and as tolerated.  Continue to take alprazolam as needed as prescribed. - buPROPion ER (WELLBUTRIN SR) 100 MG 12 hr tablet; Take 1 tablet po QD. May increase to BID dosing as needed and as indicated.  Dispense: 60 tablet; Refill: 1  2. Sinus tachycardia Well-managed.  Continue metoprolol as  prescribed.  3. Vitamin D deficiency Recommend over-the-counter vitamin D 5000 IU daily.  Recheck in 1 year.  4. Dyslipidemia, goal LDL below 100 Mild elevation of LDL and total cholesterol on recent labs. Recommend patient limit intake of fried and fatty foods. She should increase intake of lean proteins and green leafy vegetables. Adding exercise into daily routine will also be beneficial.     Problem List Items Addressed This Visit       Other   Sinus tachycardia   Generalized anxiety disorder -  Primary   Relevant Medications   buPROPion ER (WELLBUTRIN SR) 100 MG 12 hr tablet   Vitamin D deficiency   Dyslipidemia, goal LDL below 100     Return in about 6 weeks (around 11/23/2021) for mood. added wellbutrin .         Ronnell Freshwater, NP  [facility] [phone] (phone) [phone] (fax)  [place]

## 2021-10-19 ENCOUNTER — Ambulatory Visit: Payer: Medicaid Other | Admitting: Nurse Practitioner

## 2021-10-22 ENCOUNTER — Other Ambulatory Visit: Payer: Self-pay | Admitting: Nurse Practitioner

## 2021-10-22 DIAGNOSIS — F411 Generalized anxiety disorder: Secondary | ICD-10-CM

## 2021-10-24 DIAGNOSIS — E785 Hyperlipidemia, unspecified: Secondary | ICD-10-CM | POA: Insufficient documentation

## 2021-10-24 DIAGNOSIS — E559 Vitamin D deficiency, unspecified: Secondary | ICD-10-CM | POA: Insufficient documentation

## 2021-11-03 ENCOUNTER — Other Ambulatory Visit: Payer: Self-pay | Admitting: Nurse Practitioner

## 2021-11-03 DIAGNOSIS — F411 Generalized anxiety disorder: Secondary | ICD-10-CM

## 2021-11-03 MED ORDER — BUPROPION HCL ER (SR) 100 MG PO TB12: ORAL_TABLET | ORAL | 1 refills | Status: DC

## 2021-11-23 ENCOUNTER — Ambulatory Visit (INDEPENDENT_AMBULATORY_CARE_PROVIDER_SITE_OTHER): Payer: Medicaid Other | Admitting: Nurse Practitioner

## 2021-11-23 ENCOUNTER — Encounter: Payer: Self-pay | Admitting: Nurse Practitioner

## 2021-11-23 VITALS — BP 123/78 | HR 81 | Ht 61.81 in | Wt 154.1 lb

## 2021-11-23 DIAGNOSIS — R000 Tachycardia, unspecified: Secondary | ICD-10-CM | POA: Diagnosis not present

## 2021-11-23 DIAGNOSIS — F4321 Adjustment disorder with depressed mood: Secondary | ICD-10-CM | POA: Diagnosis not present

## 2021-11-23 DIAGNOSIS — F411 Generalized anxiety disorder: Secondary | ICD-10-CM

## 2021-11-23 MED ORDER — VENLAFAXINE HCL ER 75 MG PO CP24: 75.0000 mg | ORAL_CAPSULE | Freq: Every day | ORAL | 3 refills | Status: DC

## 2021-11-23 MED ORDER — ALPRAZOLAM 1 MG PO TABS: 1.0000 mg | ORAL_TABLET | Freq: Every evening | ORAL | 2 refills | Status: DC | PRN

## 2021-11-23 NOTE — Progress Notes (Signed)
Established patient visit   Patient: Tami Foster   DOB: 02/06/1981   41 y.o. Female  MRN: [id number] Visit Date: 11/23/2021   Chief Complaint  Patient presents with   Follow-up   Subjective    HPI  Follow up visit  -generalized anxiety  -s-tarted wellbutrin SR 100 mg daily,  -states that she doesn't feel like it's helping too much. Only taking this once daily. Still smoking. Still having trouble reaching orgasm during sex. States that her father did pass away 11/04/2021. This was essentially sudden. Was at [place] and told patient he wasn't feeling well. Called 911. He had hemorrhagic stroke in [place]ons area of the brain. Patient is tearful and going through grief process.  --taking effexor 37.5 mg tablets.  --has rx to take alprazolam 1 mg to take at night as needed for anxiety/insomnia -tachycardia  --takes metoprolol and has been stable.      Medications: Outpatient Medications Prior to Visit  Medication Sig   buPROPion ER (WELLBUTRIN SR) 100 MG 12 hr tablet Take 1 tablet po QD. May increase to BID dosing as needed and as indicated.   metoprolol succinate (TOPROL-XL) 50 MG 24 hr tablet Take 1 tablet (50 mg total) by mouth daily. Take with or immediately following a meal.   [DISCONTINUED] ALPRAZolam (XANAX) 1 MG tablet Take 1 tablet (1 mg total) by mouth at bedtime as needed for anxiety.   [DISCONTINUED] venlafaxine XR (EFFEXOR-XR) 37.5 MG 24 hr capsule TAKE 1 CAPSULE BY MOUTH DAILY WITH BREAKFAST.   No facility-administered medications prior to visit.    Review of Systems  Constitutional:  Positive for fatigue. Negative for activity change, appetite change, chills and fever.  HENT:  Negative for congestion, postnasal drip, rhinorrhea, sinus pressure, sinus pain, sneezing and sore throat.   Eyes: Negative.   Respiratory:  Negative for cough, chest tightness, shortness of breath and wheezing.   Cardiovascular:  Negative for chest pain and palpitations.   Gastrointestinal:  Negative for abdominal pain, constipation, diarrhea, nausea and vomiting.  Endocrine: Negative for cold intolerance, heat intolerance, polydipsia and polyuria.  Genitourinary:  Negative for dyspareunia, dysuria, flank pain, frequency and urgency.  Musculoskeletal:  Negative for arthralgias, back pain and myalgias.  Skin:  Negative for rash.  Allergic/Immunologic: Negative for environmental allergies.  Neurological:  Negative for dizziness, weakness and headaches.  Hematological:  Negative for adenopathy.  Psychiatric/Behavioral:  Positive for dysphoric mood. The patient is nervous/anxious.        Objective     Today's Vitals   11/23/21 1021  BP: 123/78  Pulse: 81  SpO2: 98%  Weight: 154 lb 1.9 oz (69.9 kg)  Height: 5' 1.81" (1.57 m)   Body mass index is 28.36 kg/m.   BP Readings from Last 3 Encounters:  11/23/21 123/78  10/12/21 119/76  09/30/21 126/76    Wt Readings from Last 3 Encounters:  11/23/21 154 lb 1.9 oz (69.9 kg)  10/12/21 152 lb 12.8 oz (69.3 kg)  09/30/21 155 lb 6.4 oz (70.5 kg)    Physical Exam Vitals and nursing note reviewed.  Constitutional:      Appearance: Normal appearance. She is well-developed.  HENT:     Head: Normocephalic and atraumatic.  Eyes:     Pupils: Pupils are equal, round, and reactive to light.  Cardiovascular:     Rate and Rhythm: Normal rate and regular rhythm.     Pulses: Normal pulses.     Heart sounds: Normal heart sounds.  Pulmonary:  Effort: Pulmonary effort is normal.     Breath sounds: Normal breath sounds.  Abdominal:     Palpations: Abdomen is soft.  Musculoskeletal:        General: Normal range of motion.     Cervical back: Normal range of motion and neck supple.  Lymphadenopathy:     Cervical: No cervical adenopathy.  Skin:    General: Skin is warm and dry.     Capillary Refill: Capillary refill takes less than 2 seconds.  Neurological:     General: No focal deficit present.      Mental Status: She is alert and oriented to person, place, and time.  Psychiatric:        Attention and Perception: Attention and perception normal.        Mood and Affect: Mood is anxious and depressed. Affect is tearful.        Speech: Speech normal.        Behavior: Behavior normal. Behavior is cooperative.        Thought Content: Thought content normal.        Cognition and Memory: Cognition and memory normal.        Judgment: Judgment normal.       Assessment & Plan     1. Sinus tachycardia Stable. Continue metoprolol as prescribed   2. Generalized anxiety disorder Increase Effexor to 75 mg daily. Encouraged her to add afternoon dose of wellbutrin 100 mg daily. May continue to take alprazolam at bedtime as needed. Refills were sent to her pharmacy today  - venlafaxine XR (EFFEXOR-XR) 75 MG 24 hr capsule; Take 1 capsule (75 mg total) by mouth daily with breakfast.  Dispense: 30 capsule; Refill: 3 - ALPRAZolam (XANAX) 1 MG tablet; Take 1 tablet (1 mg total) by mouth at bedtime as needed for anxiety.  Dispense: 30 tablet; Refill: 2  3. Grief reaction Patient havig acute grief after sudden passing of her father 11/04/2021. Adjust antidepressant therapy as above. Encouraged her to contact grief counseling if needed. Reassess in 6 weeks.     Problem List Items Addressed This Visit       Other   Sinus tachycardia - Primary   Generalized anxiety disorder   Relevant Medications   venlafaxine XR (EFFEXOR-XR) 75 MG 24 hr capsule   ALPRAZolam (XANAX) 1 MG tablet   Grief reaction     Return in about 6 weeks (around 01/04/2022) for mood - increased dose effexor .         Carlean Jews, NP  [facility] at [facility] [phone] (phone) [phone] (fax)  [facility]

## 2021-12-24 ENCOUNTER — Other Ambulatory Visit: Payer: Self-pay | Admitting: Nurse Practitioner

## 2021-12-24 DIAGNOSIS — F411 Generalized anxiety disorder: Secondary | ICD-10-CM

## 2022-01-02 NOTE — Progress Notes (Deleted)
Established patient visit   Patient: Tami Foster   DOB: [birth year]   41 y.o. Female  MRN: [id number] Visit Date: 01/03/2022   No chief complaint on file.  Subjective    HPI  Follow up visit.  - increased dose Effexor at her last visit to 75 mg daily  -taking Wellbutrin  -takes alprazolam 1 mg at bedtime if needed for anxiety/insomnia  -history of sinus tachycardia.  -takes metoprolol which does help to control heart rate.  --gets worse with worry and anxiety. Taking the alprazolam does help with this as well.    Medications: Outpatient Medications Prior to Visit  Medication Sig   ALPRAZolam (XANAX) 1 MG tablet Take 1 tablet (1 mg total) by mouth at bedtime as needed for anxiety.   buPROPion ER (WELLBUTRIN SR) 100 MG 12 hr tablet Take 1 tablet po QD. May increase to BID dosing as needed and as indicated.   metoprolol succinate (TOPROL-XL) 50 MG 24 hr tablet Take 1 tablet (50 mg total) by mouth daily. Take with or immediately following a meal.   venlafaxine XR (EFFEXOR-XR) 75 MG 24 hr capsule Take 1 capsule (75 mg total) by mouth daily with breakfast.   No facility-administered medications prior to visit.    Review of Systems  {Labs (Optional):23779}   Objective    There were no vitals taken for this visit. BP Readings from Last 3 Encounters:  11/23/21 123/78  10/12/21 119/76  09/30/21 126/76    Wt Readings from Last 3 Encounters:  11/23/21 154 lb 1.9 oz (69.9 kg)  10/12/21 152 lb 12.8 oz (69.3 kg)  09/30/21 155 lb 6.4 oz (70.5 kg)    Physical Exam  ***  No results found for any visits on 01/03/22.  Assessment & Plan     Problem List Items Addressed This Visit   None    No follow-ups on file.         Carlean Jews, NP  [facility] at [facility] [phone] (phone) [phone] (fax)  [facility]

## 2022-01-03 ENCOUNTER — Ambulatory Visit: Payer: Medicaid Other | Admitting: Nurse Practitioner

## 2022-01-10 NOTE — Progress Notes (Signed)
Patient: Tami Foster   DOB: [birth year]   41 y.o. Female  MRN: [id number] Visit Date: 01/11/2022  Virtual Visit via Telephone Note  I connected with Tami Foster on 01/11/22 at  8:50 AM EDT by telephone and verified that I am speaking with the correct person using two identifiers.  Location: Patient: home Provider: [place] [facility] at [facility]     I discussed the limitations, risks, security and privacy concerns of performing an evaluation and management service by telephone and the availability of in person appointments. I also discussed with the patient that there may be a patient responsible charge related to this service. The patient expressed understanding and agreed to proceed.  Subjective    HPI  Follow up visit.  -increased dose effexor to  75 mg daily.  -continues wellbutrin 100 mg up to BID -does take alprazolam 1 mg at night if needed for anxiety/insomnia.  -the patient states that she has had some symptoms which seem to be related to the increased dosing of effexor. She is having headache, nausea, fatigue, and generalized joint pain. She states that she just feels as though she is getting sick, but never really gets sick.  -she has stopped taking the wellbutrin. Does not feel lkike it is doing anything for her. Feels like she was taking it just to be taking something.    Medications: Outpatient Medications Prior to Visit  Medication Sig Note   ALPRAZolam (XANAX) 1 MG tablet Take 1 tablet (1 mg total) by mouth at bedtime as needed for anxiety.    metoprolol succinate (TOPROL-XL) 50 MG 24 hr tablet Take 1 tablet (50 mg total) by mouth daily. Take with or immediately following a meal.    [DISCONTINUED] venlafaxine XR (EFFEXOR-XR) 75 MG 24 hr capsule Take 1 capsule (75 mg total) by mouth daily with breakfast. 01/11/2022: negative side effects    [DISCONTINUED] buPROPion ER (WELLBUTRIN SR) 100 MG 12 hr tablet Take 1 tablet po QD. May increase to BID dosing as  needed and as indicated.    No facility-administered medications prior to visit.    Review of Systems  Constitutional:  Positive for appetite change, chills and fatigue. Negative for activity change and fever.  HENT:  Negative for congestion, postnasal drip, rhinorrhea, sinus pressure, sinus pain, sneezing and sore throat.   Eyes: Negative.   Respiratory:  Negative for cough, chest tightness, shortness of breath and wheezing.   Cardiovascular:  Negative for chest pain and palpitations.  Gastrointestinal:  Negative for abdominal pain, constipation, diarrhea, nausea and vomiting.  Endocrine: Negative for cold intolerance, heat intolerance, polydipsia and polyuria.  Genitourinary:  Negative for dyspareunia, dysuria, flank pain, frequency and urgency.  Musculoskeletal:  Negative for arthralgias, back pain and myalgias.       Generalized joint pain/body aches   Skin:  Negative for rash.  Allergic/Immunologic: Negative for environmental allergies.  Neurological:  Positive for headaches. Negative for dizziness and weakness.  Hematological:  Negative for adenopathy.  Psychiatric/Behavioral:  The patient is not nervous/anxious.        Objective     Today's Vitals   01/11/22 0920  Weight: 154 lb 3 oz (69.9 kg)  Height: 5' 1.81" (1.57 m)   Body mass index is 28.38 kg/m.   BP Readings from Last 3 Encounters:  11/23/21 123/78  10/12/21 119/76  09/30/21 126/76    Wt Readings from Last 3 Encounters:  01/11/22 154 lb 3 oz (69.9 kg)  11/23/21 154 lb  1.9 oz (69.9 kg)  10/12/21 152 lb 12.8 oz (69.3 kg)      Assessment & Plan    1. Other fatigue Likely related to increased dose effexor. Wil change to Pristiq 50 mg daily and reassess in four weeks   2. Generalized anxiety disorder Patient has stopped taking wellbutrin. We will change effexor 75 mg to pristiq 50 mg to see if this helps with new onset of negative side effects. Reassess in four weeks.  - desvenlafaxine (PRISTIQ) 50 MG  24 hr tablet; Take 1 tablet (50 mg total) by mouth daily.  Dispense: 30 tablet; Refill: 2   Problem List Items Addressed This Visit       Other   Other fatigue - Primary   Generalized anxiety disorder   Relevant Medications   desvenlafaxine (PRISTIQ) 50 MG 24 hr tablet     Return in about 4 weeks (around 02/08/2022) for mood - changed effecor to pristiq .      I discussed the assessment and treatment plan with the patient. The patient was provided an opportunity to ask questions and all were answered. The patient agreed with the plan and demonstrated an understanding of the instructions.   The patient was advised to call back or seek an in-person evaluation if the symptoms worsen or if the condition fails to improve as anticipated.  I provided 15 minutes of non-face-to-face time during this encounter.   Carlean Jews, NPEstablished patient visit    Carlean Jews, NP  [facility] at [facility] [phone] (phone) [phone] (fax)  [facility]

## 2022-01-11 ENCOUNTER — Ambulatory Visit (INDEPENDENT_AMBULATORY_CARE_PROVIDER_SITE_OTHER): Payer: Medicaid Other | Admitting: Nurse Practitioner

## 2022-01-11 ENCOUNTER — Encounter: Payer: Self-pay | Admitting: Nurse Practitioner

## 2022-01-11 VITALS — Ht 61.81 in | Wt 154.2 lb

## 2022-01-11 DIAGNOSIS — F411 Generalized anxiety disorder: Secondary | ICD-10-CM | POA: Diagnosis not present

## 2022-01-11 DIAGNOSIS — R5383 Other fatigue: Secondary | ICD-10-CM

## 2022-01-11 MED ORDER — DESVENLAFAXINE SUCCINATE ER 50 MG PO TB24: 50.0000 mg | ORAL_TABLET | Freq: Every day | ORAL | 2 refills | Status: DC

## 2022-02-03 ENCOUNTER — Other Ambulatory Visit: Payer: Self-pay | Admitting: Nurse Practitioner

## 2022-02-03 DIAGNOSIS — F411 Generalized anxiety disorder: Secondary | ICD-10-CM

## 2022-02-08 ENCOUNTER — Ambulatory Visit (INDEPENDENT_AMBULATORY_CARE_PROVIDER_SITE_OTHER): Payer: Medicaid Other | Admitting: Nurse Practitioner

## 2022-02-08 ENCOUNTER — Encounter: Payer: Self-pay | Admitting: Nurse Practitioner

## 2022-02-08 VITALS — BP 111/75 | HR 99 | Ht 61.81 in | Wt 162.4 lb

## 2022-02-08 DIAGNOSIS — Z8632 Personal history of gestational diabetes: Secondary | ICD-10-CM | POA: Diagnosis not present

## 2022-02-08 DIAGNOSIS — L659 Nonscarring hair loss, unspecified: Secondary | ICD-10-CM

## 2022-02-08 DIAGNOSIS — R5383 Other fatigue: Secondary | ICD-10-CM | POA: Diagnosis not present

## 2022-02-08 DIAGNOSIS — N921 Excessive and frequent menstruation with irregular cycle: Secondary | ICD-10-CM

## 2022-02-08 DIAGNOSIS — E559 Vitamin D deficiency, unspecified: Secondary | ICD-10-CM | POA: Diagnosis not present

## 2022-02-08 DIAGNOSIS — R635 Abnormal weight gain: Secondary | ICD-10-CM | POA: Diagnosis not present

## 2022-02-08 DIAGNOSIS — F411 Generalized anxiety disorder: Secondary | ICD-10-CM

## 2022-02-08 NOTE — Progress Notes (Signed)
Established patient visit   Patient: Tami Foster   DOB: 05-Aug-1980   41 y.o. Female  MRN: [id number] Visit Date: 02/08/2022   Chief Complaint  Patient presents with   Follow-up   Subjective    HPI  Changed effexor  Patient has effectively changed to pristiq 50 mg daily. Unsure if she has noted improvement. -hair loss.  -fatigue.  -has been anemic in the past.  -due to have check of routine, fasting labs     Medications: Outpatient Medications Prior to Visit  Medication Sig   ALPRAZolam (XANAX) 1 MG tablet Take 1 tablet (1 mg total) by mouth at bedtime as needed for anxiety.   desvenlafaxine (PRISTIQ) 50 MG 24 hr tablet Take 1 tablet (50 mg total) by mouth daily.   metoprolol succinate (TOPROL-XL) 50 MG 24 hr tablet Take 1 tablet (50 mg total) by mouth daily. Take with or immediately following a meal.   No facility-administered medications prior to visit.    Review of Systems  Constitutional:  Positive for fatigue. Negative for activity change, appetite change, chills and fever.  HENT:  Negative for congestion, postnasal drip, rhinorrhea, sinus pressure, sinus pain, sneezing and sore throat.   Eyes: Negative.   Respiratory:  Negative for cough, chest tightness, shortness of breath and wheezing.   Cardiovascular:  Negative for chest pain and palpitations.  Gastrointestinal:  Negative for abdominal pain, constipation, diarrhea, nausea and vomiting.  Endocrine: Negative for cold intolerance, heat intolerance, polydipsia and polyuria.       Alopecia   Genitourinary:  Negative for dyspareunia, dysuria, flank pain, frequency and urgency.  Musculoskeletal:  Negative for arthralgias, back pain and myalgias.  Skin:  Negative for rash.  Allergic/Immunologic: Negative for environmental allergies.  Neurological:  Negative for dizziness, weakness and headaches.  Hematological:  Negative for adenopathy.  Psychiatric/Behavioral:  Positive for dysphoric mood. The patient is  nervous/anxious.        Objective     Today's Vitals   02/08/22 1052 02/08/22 1117  BP: 111/75   Pulse: (Abnormal) 111 99  SpO2: 99%   Weight: 162 lb 6.4 oz (73.7 kg)   Height: 5' 1.81" (1.57 m)    Body mass index is 29.89 kg/m.  BP Readings from Last 3 Encounters:  02/08/22 111/75  11/23/21 123/78  10/12/21 119/76    Wt Readings from Last 3 Encounters:  02/08/22 162 lb 6.4 oz (73.7 kg)  01/11/22 154 lb 3 oz (69.9 kg)  11/23/21 154 lb 1.9 oz (69.9 kg)    Physical Exam Vitals and nursing note reviewed.  Constitutional:      Appearance: Normal appearance. She is well-developed.  HENT:     Head: Normocephalic and atraumatic.     Nose: Nose normal.     Mouth/Throat:     Mouth: Mucous membranes are moist.     Pharynx: Oropharynx is clear.  Eyes:     Extraocular Movements: Extraocular movements intact.     Conjunctiva/sclera: Conjunctivae normal.     Pupils: Pupils are equal, round, and reactive to light.  Cardiovascular:     Rate and Rhythm: Normal rate and regular rhythm.     Pulses: Normal pulses.     Heart sounds: Normal heart sounds.  Pulmonary:     Effort: Pulmonary effort is normal.     Breath sounds: Normal breath sounds.  Abdominal:     Palpations: Abdomen is soft.  Musculoskeletal:        General: Normal range of motion.  Cervical back: Normal range of motion and neck supple.  Lymphadenopathy:     Cervical: No cervical adenopathy.  Skin:    General: Skin is warm and dry.     Capillary Refill: Capillary refill takes less than 2 seconds.  Neurological:     General: No focal deficit present.     Mental Status: She is alert and oriented to person, place, and time.  Psychiatric:        Mood and Affect: Mood normal.        Behavior: Behavior normal.        Thought Content: Thought content normal.        Judgment: Judgment normal.      Results for orders placed or performed in visit on 02/08/22  CBC  Result Value Ref Range   WBC 9.0 3.4 - 10.8  x10E3/uL   RBC 4.74 3.77 - 5.28 x10E6/uL   Hemoglobin 15.6 11.1 - 15.9 g/dL   Hematocrit 44.9 34.0 - 46.6 %   MCV 95 79 - 97 fL   MCH 32.9 26.6 - 33.0 pg   MCHC 34.7 31.5 - 35.7 g/dL   RDW 11.8 11.7 - 15.4 %   Platelets 208 150 - 450 x10E3/uL  Comp Met (CMET)  Result Value Ref Range   Glucose 100 (H) 70 - 99 mg/dL   BUN 13 6 - 20 mg/dL   Creatinine, Ser 0.60 0.57 - 1.00 mg/dL   eGFR 117 >59 mL/min/1.73   BUN/Creatinine Ratio 22 9 - 23   Sodium 139 134 - 144 mmol/L   Potassium 4.2 3.5 - 5.2 mmol/L   Chloride 104 96 - 106 mmol/L   CO2 19 (L) 20 - 29 mmol/L   Calcium 9.1 8.7 - 10.2 mg/dL   Total Protein 6.8 6.0 - 8.5 g/dL   Albumin 4.5 3.9 - 4.9 g/dL   Globulin, Total 2.3 1.5 - 4.5 g/dL   Albumin/Globulin Ratio 2.0 1.2 - 2.2   Bilirubin Total 0.6 0.0 - 1.2 mg/dL   Alkaline Phosphatase 94 44 - 121 IU/L   AST 15 0 - 40 IU/L   ALT 12 0 - 32 IU/L  TSH + free T4  Result Value Ref Range   TSH 0.729 0.450 - 4.500 uIU/mL   Free T4 1.35 0.82 - 1.77 ng/dL  Hemoglobin A1c  Result Value Ref Range   Hgb A1c MFr Bld 5.4 4.8 - 5.6 %   Est. average glucose Bld gHb Est-mCnc 108 mg/dL  FSH/LH  Result Value Ref Range   LH 7.7 mIU/mL   FSH 6.4 mIU/mL  Estradiol  Result Value Ref Range   Estradiol 28.5 pg/mL  Testosterone  Result Value Ref Range   Testosterone 46 8 - 60 ng/dL  B12 and Folate Panel  Result Value Ref Range   Vitamin B-12 675 232 - 1,245 pg/mL   Folate 17.3 >3.0 ng/mL  Ferritin  Result Value Ref Range   Ferritin 84 15 - 150 ng/mL  VITAMIN D 25 Hydroxy (Vit-D Deficiency, Fractures)  Result Value Ref Range   Vit D, 25-Hydroxy 31.1 30.0 - 100.0 ng/mL    Assessment & Plan    1. Generalized anxiety disorder Stable. Patient will continue with treatment with pristiq. Reassess in 6 weeks   2. Other fatigue Check thyroid panel, anemia panel, and vitamin d level.  - TSH + free T4 - B12 and Folate Panel - Ferritin - VITAMIN D 25 Hydroxy (Vit-D Deficiency,  Fractures)  3. Alopecia Check thyroid panel for  further evaluation.   4. Menorrhagia with irregular cycle Check reproductive hormones with routine labs. Refer to GYN provider as indicated  - CBC - Comp Met (CMET) - FSH/LH - Estradiol - Testosterone - B12 and Folate Panel - Ferritin  5. Abnormal weight gain Check thyroid panel along with screening for DM2  - Comp Met (CMET) - TSH + free T4 - Hemoglobin A1c  6. Vitamin D deficiency Check vitamin d level and treat deficiency as indicated  - VITAMIN D 25 Hydroxy (Vit-D Deficiency, Fractures)  7. History of gestational diabetes Screening for DM2  - Hemoglobin A1c   Problem List Items Addressed This Visit       Musculoskeletal and Integument   Alopecia     Other   Abnormal weight gain   Relevant Orders   Comp Met (CMET) (Completed)   TSH + free T4 (Completed)   Hemoglobin A1c (Completed)   Other fatigue   Relevant Orders   TSH + free T4 (Completed)   B12 and Folate Panel (Completed)   Ferritin (Completed)   VITAMIN D 25 Hydroxy (Vit-D Deficiency, Fractures) (Completed)   Generalized anxiety disorder - Primary   History of gestational diabetes   Relevant Orders   Hemoglobin A1c (Completed)   Vitamin D deficiency   Relevant Orders   VITAMIN D 25 Hydroxy (Vit-D Deficiency, Fractures) (Completed)   Menorrhagia with irregular cycle   Relevant Orders   CBC (Completed)   Comp Met (CMET) (Completed)   FSH/LH (Completed)   Estradiol (Completed)   Testosterone (Completed)   B12 and Folate Panel (Completed)   Ferritin (Completed)     Return in about 6 weeks (around 03/22/2022) for mood.         Ronnell Freshwater, NP  [facility] at [facility] [phone] (phone) [phone] (fax)  [place]

## 2022-02-09 LAB — COMPREHENSIVE METABOLIC PANEL
ALT: 12 IU/L (ref 0–32)
AST: 15 IU/L (ref 0–40)
Albumin/Globulin Ratio: 2 (ref 1.2–2.2)
Albumin: 4.5 g/dL (ref 3.9–4.9)
Alkaline Phosphatase: 94 IU/L (ref 44–121)
BUN/Creatinine Ratio: 22 (ref 9–23)
BUN: 13 mg/dL (ref 6–20)
Bilirubin Total: 0.6 mg/dL (ref 0.0–1.2)
CO2: 19 mmol/L — ABNORMAL LOW (ref 20–29)
Calcium: 9.1 mg/dL (ref 8.7–10.2)
Chloride: 104 mmol/L (ref 96–106)
Creatinine, Ser: 0.6 mg/dL (ref 0.57–1.00)
Globulin, Total: 2.3 g/dL (ref 1.5–4.5)
Glucose: 100 mg/dL — ABNORMAL HIGH (ref 70–99)
Potassium: 4.2 mmol/L (ref 3.5–5.2)
Sodium: 139 mmol/L (ref 134–144)
Total Protein: 6.8 g/dL (ref 6.0–8.5)
eGFR: 117 mL/min/{1.73_m2} (ref >59)

## 2022-02-09 LAB — TSH+FREE T4
Free T4: 1.35 ng/dL (ref 0.82–1.77)
TSH: 0.729 u[IU]/mL (ref 0.450–4.500)

## 2022-02-09 LAB — CBC
Hematocrit: 44.9 % (ref 34.0–46.6)
Hemoglobin: 15.6 g/dL (ref 11.1–15.9)
MCH: 32.9 pg (ref 26.6–33.0)
MCHC: 34.7 g/dL (ref 31.5–35.7)
MCV: 95 fL (ref 79–97)
Platelets: 208 10*3/uL (ref 150–450)
RBC: 4.74 x10E6/uL (ref 3.77–5.28)
RDW: 11.8 % (ref 11.7–15.4)
WBC: 9 10*3/uL (ref 3.4–10.8)

## 2022-02-09 LAB — HEMOGLOBIN A1C
Est. average glucose Bld gHb Est-mCnc: 108 mg/dL
Hgb A1c MFr Bld: 5.4 % (ref 4.8–5.6)

## 2022-02-09 LAB — FERRITIN: Ferritin: 84 ng/mL (ref 15–150)

## 2022-02-09 LAB — VITAMIN D 25 HYDROXY (VIT D DEFICIENCY, FRACTURES): Vit D, 25-Hydroxy: 31.1 ng/mL (ref 30.0–100.0)

## 2022-02-09 LAB — B12 AND FOLATE PANEL
Folate: 17.3 ng/mL (ref >3.0)
Vitamin B-12: 675 pg/mL (ref 232–1245)

## 2022-02-09 LAB — TESTOSTERONE: Testosterone: 46 ng/dL (ref 8–60)

## 2022-02-09 LAB — FSH/LH
FSH: 6.4 m[IU]/mL
LH: 7.7 m[IU]/mL

## 2022-02-09 LAB — ESTRADIOL: Estradiol: 28.5 pg/mL

## 2022-02-21 NOTE — Progress Notes (Signed)
Labs reviewed with patient during visit.

## 2022-02-27 DIAGNOSIS — N921 Excessive and frequent menstruation with irregular cycle: Secondary | ICD-10-CM | POA: Insufficient documentation

## 2022-03-13 ENCOUNTER — Other Ambulatory Visit: Payer: Self-pay | Admitting: Nurse Practitioner

## 2022-03-13 DIAGNOSIS — F411 Generalized anxiety disorder: Secondary | ICD-10-CM

## 2022-03-21 ENCOUNTER — Other Ambulatory Visit: Payer: Self-pay | Admitting: Nurse Practitioner

## 2022-03-21 DIAGNOSIS — F411 Generalized anxiety disorder: Secondary | ICD-10-CM

## 2022-03-21 NOTE — Progress Notes (Signed)
Established patient visit   Patient: Tami Foster   DOB: 1981/03/15   41 y.o. Female  MRN: [id number] Visit Date: 03/22/2022   Chief Complaint  Patient presents with   Follow-up   Nasal Congestion   Subjective    HPI  Follow up  -effectively changed to Pristiq 50 mg daily  -does take alprazolam 1 mg at bedtime when needed for acute anxiety  -labs done at last visit due to excess fatigue -all within  normal limits -reproductive hormones circulating normally -continues to have irregular menstrual periods   Having congestion  -headache -scratchy throat in the mornings  -cough - sometimes productive and sometimes not.  -family members also ill.  --have been negative for flu, strep, RSV, and COVID  Left elbow tenderness -hurts to pick up anything with her left arm.  -aching up into her left shoulder.  -hurts with movement.  -tingling in the fingers of left hand.    Medications: Outpatient Medications Prior to Visit  Medication Sig   desvenlafaxine (PRISTIQ) 50 MG 24 hr tablet TAKE 1 TABLET BY MOUTH EVERY DAY   metoprolol succinate (TOPROL-XL) 50 MG 24 hr tablet Take 1 tablet (50 mg total) by mouth daily. Take with or immediately following a meal.   [DISCONTINUED] ALPRAZolam (XANAX) 1 MG tablet Take 1 tablet (1 mg total) by mouth at bedtime as needed for anxiety.   No facility-administered medications prior to visit.    Review of Systems  Constitutional:  Positive for fatigue. Negative for activity change, appetite change, chills and fever.  HENT:  Positive for congestion, rhinorrhea, sinus pressure, sinus pain and sore throat. Negative for postnasal drip and sneezing.   Eyes: Negative.   Respiratory:  Positive for cough. Negative for chest tightness, shortness of breath and wheezing.   Cardiovascular:  Negative for chest pain and palpitations.  Gastrointestinal:  Negative for abdominal pain, constipation, diarrhea, nausea and vomiting.  Endocrine: Negative for cold  intolerance, heat intolerance, polydipsia and polyuria.  Genitourinary:  Negative for dyspareunia, dysuria, flank pain, frequency and urgency.  Musculoskeletal:  Negative for arthralgias, back pain and myalgias.  Skin:  Negative for rash.  Allergic/Immunologic: Positive for environmental allergies.  Neurological:  Positive for headaches. Negative for dizziness and weakness.  Hematological:  Negative for adenopathy.  Psychiatric/Behavioral:  The patient is nervous/anxious.     Last CBC Lab Results  Component Value Date   WBC 9.0 02/08/2022   HGB 15.6 02/08/2022   HCT 44.9 02/08/2022   MCV 95 02/08/2022   MCH 32.9 02/08/2022   RDW 11.8 02/08/2022   PLT 208 38/17/7116   Last metabolic panel Lab Results  Component Value Date   GLUCOSE 100 (H) 02/08/2022   NA 139 02/08/2022   K 4.2 02/08/2022   CL 104 02/08/2022   CO2 19 (L) 02/08/2022   BUN 13 02/08/2022   CREATININE 0.60 02/08/2022   EGFR 117 02/08/2022   CALCIUM 9.1 02/08/2022   PROT 6.8 02/08/2022   ALBUMIN 4.5 02/08/2022   LABGLOB 2.3 02/08/2022   AGRATIO 2.0 02/08/2022   BILITOT 0.6 02/08/2022   ALKPHOS 94 02/08/2022   AST 15 02/08/2022   ALT 12 02/08/2022   ANIONGAP 8 05/04/2020   Last lipids Lab Results  Component Value Date   CHOL 208 (H) 09/30/2021   HDL 68 09/30/2021   LDLCALC 127 (H) 09/30/2021   TRIG 73 09/30/2021   CHOLHDL 3.1 09/30/2021   Last hemoglobin A1c Lab Results  Component Value Date   HGBA1C 5.4  02/08/2022   Last thyroid functions Lab Results  Component Value Date   TSH 0.729 02/08/2022   T3TOTAL 97.9 07/01/2013   Last vitamin D Lab Results  Component Value Date   VD25OH 31.1 02/08/2022       Objective     Today's Vitals   03/22/22 0950  BP: 116/80  Pulse: 90  SpO2: 97%  Weight: 163 lb (73.9 kg)  Height: 5' 1.81" (1.57 m)   Body mass index is 30 kg/m.  BP Readings from Last 3 Encounters:  03/22/22 116/80  02/08/22 111/75  11/23/21 123/78    Wt Readings from  Last 3 Encounters:  03/22/22 163 lb (73.9 kg)  02/08/22 162 lb 6.4 oz (73.7 kg)  01/11/22 154 lb 3 oz (69.9 kg)    Physical Exam Vitals and nursing note reviewed.  Constitutional:      Appearance: Normal appearance. She is well-developed. She is ill-appearing.  HENT:     Head: Normocephalic and atraumatic.     Right Ear: Tympanic membrane is erythematous and bulging.     Left Ear: Tympanic membrane is erythematous and bulging.     Nose: Congestion present.     Right Sinus: Maxillary sinus tenderness and frontal sinus tenderness present.     Left Sinus: Maxillary sinus tenderness and frontal sinus tenderness present.     Mouth/Throat:     Pharynx: Posterior oropharyngeal erythema present.  Eyes:     Pupils: Pupils are equal, round, and reactive to light.  Cardiovascular:     Rate and Rhythm: Normal rate and regular rhythm.     Pulses: Normal pulses.     Heart sounds: Normal heart sounds.  Pulmonary:     Effort: Pulmonary effort is normal.     Breath sounds: Normal breath sounds.  Abdominal:     Palpations: Abdomen is soft.  Musculoskeletal:        General: Normal range of motion.     Cervical back: Normal range of motion and neck supple.  Lymphadenopathy:     Cervical: Cervical adenopathy present.  Skin:    General: Skin is warm and dry.     Capillary Refill: Capillary refill takes less than 2 seconds.  Neurological:     General: No focal deficit present.     Mental Status: She is alert and oriented to person, place, and time.  Psychiatric:        Attention and Perception: Attention and perception normal.        Mood and Affect: Affect normal. Mood is anxious.        Behavior: Behavior is uncooperative.        Thought Content: Thought content normal.        Judgment: Judgment normal.      Assessment & Plan    1. Acute non-recurrent pansinusitis Start z-pack. Take as directed for 5 days. Rest and increase fluids. Continue using OTC medication to control symptoms.   -  azithromycin (ZITHROMAX) 250 MG tablet; z-pack - take as directed for 5 days  Dispense: 6 tablet; Refill: 0  2. Paroxysmal tachycardia (HCC) Stable. Continue toprol XL as previously prescribed   3. Generalized anxiety disorder May take alprazolam at bedtime as needed for acute anxiety and insomnia. New prescription sent to her  pharmacy today  - ALPRAZolam (XANAX) 1 MG tablet; Take 1 tablet (1 mg total) by mouth at bedtime as needed for anxiety.  Dispense: 30 tablet; Refill: 2  4. Left tennis elbow Trial prednisone. Take as directed  for 6 days. Take tylenol and ibuprofen as needed and as indicated. Consider referral to orthopedics as indicated.  - predniSONE (STERAPRED UNI-PAK 21 TAB) 10 MG (21) TBPK tablet; 6 day taper - take by mouth as directed for 6 days  Dispense: 21 tablet; Refill: 0    Problem List Items Addressed This Visit       Cardiovascular and Mediastinum   Paroxysmal tachycardia (HCC)     Other   Generalized anxiety disorder   Relevant Medications   ALPRAZolam (XANAX) 1 MG tablet   Other Visit Diagnoses     Acute non-recurrent pansinusitis    -  Primary   Relevant Medications   azithromycin (ZITHROMAX) 250 MG tablet   predniSONE (STERAPRED UNI-PAK 21 TAB) 10 MG (21) TBPK tablet   Left tennis elbow       Relevant Medications   predniSONE (STERAPRED UNI-PAK 21 TAB) 10 MG (21) TBPK tablet        Return in about 3 months (around 06/22/2022) for mood, blood pressure.         Ronnell Freshwater, NP  [facility] [phone] (phone) [phone] (fax)  [place]

## 2022-03-22 ENCOUNTER — Encounter: Payer: Self-pay | Admitting: Nurse Practitioner

## 2022-03-22 ENCOUNTER — Ambulatory Visit (INDEPENDENT_AMBULATORY_CARE_PROVIDER_SITE_OTHER): Payer: Medicaid Other | Admitting: Nurse Practitioner

## 2022-03-22 VITALS — BP 116/80 | HR 90 | Ht 61.81 in | Wt 163.0 lb

## 2022-03-22 DIAGNOSIS — J0140 Acute pansinusitis, unspecified: Secondary | ICD-10-CM

## 2022-03-22 DIAGNOSIS — F411 Generalized anxiety disorder: Secondary | ICD-10-CM | POA: Diagnosis not present

## 2022-03-22 DIAGNOSIS — I479 Paroxysmal tachycardia, unspecified: Secondary | ICD-10-CM | POA: Diagnosis not present

## 2022-03-22 DIAGNOSIS — M7712 Lateral epicondylitis, left elbow: Secondary | ICD-10-CM | POA: Diagnosis not present

## 2022-03-22 MED ORDER — PREDNISONE 10 MG (21) PO TBPK: ORAL_TABLET | ORAL | 0 refills | Status: DC

## 2022-03-22 MED ORDER — AZITHROMYCIN 250 MG PO TABS: ORAL_TABLET | ORAL | 0 refills | Status: DC

## 2022-03-22 MED ORDER — ALPRAZOLAM 1 MG PO TABS: 1.0000 mg | ORAL_TABLET | Freq: Every evening | ORAL | 2 refills | Status: DC | PRN

## 2022-04-10 ENCOUNTER — Encounter: Payer: Self-pay | Admitting: Nurse Practitioner

## 2022-04-12 ENCOUNTER — Other Ambulatory Visit: Payer: Self-pay | Admitting: Nurse Practitioner

## 2022-04-12 DIAGNOSIS — M79602 Pain in left arm: Secondary | ICD-10-CM

## 2022-04-12 DIAGNOSIS — M7712 Lateral epicondylitis, left elbow: Secondary | ICD-10-CM

## 2022-04-15 ENCOUNTER — Ambulatory Visit (INDEPENDENT_AMBULATORY_CARE_PROVIDER_SITE_OTHER): Payer: Medicaid Other

## 2022-04-15 ENCOUNTER — Ambulatory Visit (INDEPENDENT_AMBULATORY_CARE_PROVIDER_SITE_OTHER): Payer: Medicaid Other | Admitting: Orthopaedic Surgery

## 2022-04-15 DIAGNOSIS — M545 Low back pain, unspecified: Secondary | ICD-10-CM | POA: Diagnosis not present

## 2022-04-15 DIAGNOSIS — M25522 Pain in left elbow: Secondary | ICD-10-CM

## 2022-04-15 DIAGNOSIS — M5412 Radiculopathy, cervical region: Secondary | ICD-10-CM

## 2022-04-15 DIAGNOSIS — G8929 Other chronic pain: Secondary | ICD-10-CM | POA: Diagnosis not present

## 2022-04-15 MED ORDER — DICLOFENAC SODIUM 75 MG PO TBEC: 75.0000 mg | DELAYED_RELEASE_TABLET | Freq: Two times a day (BID) | ORAL | 2 refills | Status: DC | PRN

## 2022-04-15 MED ORDER — METHYLPREDNISOLONE ACETATE 40 MG/ML IJ SUSP
40.0000 mg | INTRAMUSCULAR | Status: AC | PRN
  Administered 2022-04-15: 40 mg via INTRA_ARTICULAR

## 2022-04-15 MED ORDER — BUPIVACAINE HCL 0.5 % IJ SOLN
1.0000 mL | INTRAMUSCULAR | Status: AC | PRN
  Administered 2022-04-15: 1 mL via INTRA_ARTICULAR

## 2022-04-15 MED ORDER — LIDOCAINE HCL 1 % IJ SOLN
1.0000 mL | INTRAMUSCULAR | Status: AC | PRN
  Administered 2022-04-15: 1 mL

## 2022-04-15 MED ORDER — METHOCARBAMOL 750 MG PO TABS: 750.0000 mg | ORAL_TABLET | Freq: Two times a day (BID) | ORAL | 2 refills | Status: DC | PRN

## 2022-04-15 NOTE — Progress Notes (Signed)
Office Visit Note   Patient: Tami Foster           Date of Birth: 24-Feb-1981           MRN: [id number] Visit Date: 04/15/2022              Requested by: Ronnell Freshwater, NP [place],  [place] [address] PCP: Ronnell Freshwater, NP   Assessment & Plan: Visit Diagnoses:  1. Pain in left elbow   2. Chronic bilateral low back pain, unspecified whether sciatica present   3. Radiculopathy of cervical spine     Plan: Impression is left elbow lateral epicondylitis, cervical spine radiculopathy left upper extremity and chronic low back pain.  In regards to the tennis elbow, today we proceeded with tennis elbow injection, extensor tendon exercises as well as tennis elbow strap.  If her symptoms do not improve she will let us know.  In regards to the neck and back pain, she does not feel as though she will be able to attend formal physical therapy due to the lack of childcare.  We have provided her with a home exercise program for these and I have also sent in Voltaren and Robaxin.  If her symptoms do not improve she will let us know.  Follow-up as needed.  Follow-Up Instructions: Return if symptoms worsen or fail to improve.   Orders:  Orders Placed This Encounter  Procedures   XR Elbow Complete Left (3+View)   XR Cervical Spine 2 or 3 views   XR Lumbar Spine 2-3 Views   Meds ordered this encounter  Medications   diclofenac (VOLTAREN) 75 MG EC tablet    Sig: Take 1 tablet (75 mg total) by mouth 2 (two) times daily as needed.    Dispense:  60 tablet    Refill:  2   methocarbamol (ROBAXIN-750) 750 MG tablet    Sig: Take 1 tablet (750 mg total) by mouth 2 (two) times daily as needed for muscle spasms.    Dispense:  20 tablet    Refill:  2      Procedures: Medium Joint Inj: L lateral epicondyle on 04/15/2022 4:57 PM Details: 22 G needle Medications: 1 mL lidocaine 1 %; 40 mg methylPREDNISolone acetate 40 MG/ML; 1 mL bupivacaine 0.5 % Outcome: tolerated  well, no immediate complications      Clinical Data: No additional findings.   Subjective: Chief Complaint  Patient presents with   Left Elbow - Pain    HPI patient is a pleasant 41 year old right-hand-dominant female who comes in today with left arm pain in addition to chronic low back pain.  In regards to the arm pain, she normally has pain to the entire arm but pain that radiates from the lateral neck into the parascapular region on the left.  She complains of a constant ache with associated numbness and tingling which radiates into all 5 fingers.  She does tell me that the majority of her pain is actually to the lateral elbow.  No known injury or change in activity but notes that she is constantly carrying a heavy car seat with her 48-year-old son.  She also notes that at some point she was walking the dog on a retractable leash when he ran out [place].  Unsure if these are related to the onset of pain.  She has tried over-the-counter medication without relief.  Her PCP started her on a steroid pack recently which did not provide any  long-lasting relief.    Review of Systems as detailed in HPI.  All others reviewed and are negative.   Objective: Vital Signs: LMP 03/09/2022   Physical Exam well-developed and well-nourished female in no acute distress.  Alert and oriented x 3.  Ortho Exam left elbow reveals marked tenderness at lateral epicondyle.  No tenderness to radial tunnel.  She has increased pain with shaking my hand as well as with long finger extension, wrist extension and supination.  She has slight pain with neck flexion, extension and rotation which radiates into the parascapular region.  No focal weakness.  She is neurovascular intact distally.  Specialty Comments:  No specialty comments available.  Imaging: XR Lumbar Spine 2-3 Views  Result Date: 04/15/2022 Mild degenerative changes L5-S1  XR Elbow Complete Left (3+View)  Result Date: 04/15/2022 No acute or  structural abnormalities  XR Cervical Spine 2 or 3 views  Result Date: 04/15/2022 Moderate multilevel degenerative changes    PMFS History: Patient Active Problem List   Diagnosis Date Noted   Paroxysmal tachycardia (HCC) 03/22/2022   Menorrhagia with irregular cycle 02/27/2022   Grief reaction 11/23/2021   Vitamin D deficiency 10/24/2021   Dyslipidemia, goal LDL below 100 10/24/2021   Palpitations 09/30/2021   Alopecia 09/30/2021   Panic attack 09/30/2021   Generalized anxiety disorder 09/30/2021   History of gestational diabetes 09/30/2021   S/P tubal ligation 02/24/2021   S/P cesarean section 12/09/2020   Delivery by cesarean section for breech presentation    History of preterm delivery 11/13/2020   H/O LEEP 06/11/2020   Sinus tachycardia 05/28/2020   Adjustment disorder with mixed anxiety and depressed mood 10/08/2017   Obesity, Class I, BMI 30-34.9 10/08/2017   Other fatigue 10/08/2017   History of female hirsutism 11/11/2015   ASCUS favor benign 11/11/2015   Hirsutism 08/05/2014   Oligomenorrhea 08/05/2014   PCOS (polycystic ovarian syndrome) 08/05/2014   Abnormal weight gain 08/05/2014   Smoker 08/05/2014   Anxiety 05/06/2013   Past Medical History:  Diagnosis Date   Abnormal Pap smear    Anxiety    Depression    Family history of adverse reaction to anesthesia    sister difficult to wake   GERD (gastroesophageal reflux disease)    Gestational diabetes    Hypertension    h/o prior to pregnancy-pt has now delivered and bp is under control-Pt will ask Dr Velora Mediate ob/gyn if she needs to restart this   Spinal headache    after epidural with section on 12-03-20-had blood patch and ha resolved   Tachycardia     Family History  Problem Relation Age of Onset   Cancer Mother        bladder   Hypertension Mother    Emphysema Father    Hypertension Father    Cancer Father 65       Colon   Diabetes Paternal Grandmother    Other Neg Hx     Past Surgical  History:  Procedure Laterality Date   CESAREAN SECTION N/A 12/03/2020   Procedure: CESAREAN SECTION;  Surgeon: Nadara Mustard, MD;  Location: [facility] ORS;  Service: Obstetrics;  Laterality: N/A;   LAPAROSCOPIC BILATERAL SALPINGECTOMY Bilateral 02/11/2021   Procedure: LAPAROSCOPIC BILATERAL SALPINGECTOMY;  Surgeon: Nadara Mustard, MD;  Location: [facility] ORS;  Service: Gynecology;  Laterality: Bilateral;   LEEP     PERIURETHRAL ABSCESS DRAINAGE     abscess removed from tonsils. Tonsils not removed   WISDOM TOOTH EXTRACTION  Social History   Occupational History   Not on file  Tobacco Use   Smoking status: Every Day    Packs/day: 0.50    Years: 14.00    Total pack years: 7.00    Types: Cigarettes   Smokeless tobacco: Never  Vaping Use   Vaping Use: Former   Substances: Nicotine, Flavoring  Substance and Sexual Activity   Alcohol use: Yes    Comment: occ   Drug use: No   Sexual activity: Yes    Partners: Male    Birth control/protection: None    Comment: 1st intercourse- 19, partners- 44

## 2022-04-15 NOTE — Patient Instructions (Signed)
Low Back Sprain or Strain Rehab Ask your health care provider which exercises are safe for you. Do exercises exactly as told by your health care provider and adjust them as directed. It is normal to feel mild stretching, pulling, tightness, or discomfort as you do these exercises. Stop right away if you feel sudden pain or your pain gets worse. Do not begin these exercises until told by your health care provider. Stretching and range-of-motion exercises These exercises warm up your muscles and joints and improve the movement and flexibility of your back. These exercises also help to relieve pain, numbness, and tingling. Lumbar rotation  Lie on your back on a firm bed or the floor with your knees bent. Straighten your arms out to your sides so each arm forms a 90-degree angle (right angle) with a side of your body. Slowly move (rotate) both of your knees to one side of your body until you feel a stretch in your lower back (lumbar). Try not to let your shoulders lift off the floor. Hold this position for __________ seconds. Tense your abdominal muscles and slowly move your knees back to the starting position. Repeat this exercise on the other side of your body. Repeat __________ times. Complete this exercise __________ times a day. Single knee to chest  Lie on your back on a firm bed or the floor with both legs straight. Bend one of your knees. Use your hands to move your knee up toward your chest until you feel a gentle stretch in your lower back and buttock. Hold your leg in this position by holding on to the front of your knee. Keep your other leg as straight as possible. Hold this position for __________ seconds. Slowly return to the starting position. Repeat with your other leg. Repeat __________ times. Complete this exercise __________ times a day. Prone extension on elbows  Lie on your abdomen on a firm bed or the floor (prone position). Prop yourself up on your elbows. Use your arms  to help lift your chest up until you feel a gentle stretch in your abdomen and your lower back. This will place some of your body weight on your elbows. If this is uncomfortable, try stacking pillows under your chest. Your hips should stay down, against the surface that you are lying on. Keep your hip and back muscles relaxed. Hold this position for __________ seconds. Slowly relax your upper body and return to the starting position. Repeat __________ times. Complete this exercise __________ times a day. Strengthening exercises These exercises build strength and endurance in your back. Endurance is the ability to use your muscles for a long time, even after they get tired. Pelvic tilt This exercise strengthens the muscles that lie deep in the abdomen. Lie on your back on a firm bed or the floor with your legs extended. Bend your knees so they are pointing toward the ceiling and your feet are flat on the floor. Tighten your lower abdominal muscles to press your lower back against the floor. This motion will tilt your pelvis so your tailbone points up toward the ceiling instead of pointing to your feet or the floor. To help with this exercise, you may place a small towel under your lower back and try to push your back into the towel. Hold this position for __________ seconds. Let your muscles relax completely before you repeat this exercise. Repeat __________ times. Complete this exercise __________ times a day. Alternating arm and leg raises  Get on your hands   and knees on a firm surface. If you are on a hard floor, you may want to use padding, such as an exercise mat, to cushion your knees. Line up your arms and legs. Your hands should be directly below your shoulders, and your knees should be directly below your hips. Lift your left leg behind you. At the same time, raise your right arm and straighten it in front of you. Do not lift your leg higher than your hip. Do not lift your arm higher  than your shoulder. Keep your abdominal and back muscles tight. Keep your hips facing the ground. Do not arch your back. Keep your balance carefully, and do not hold your breath. Hold this position for __________ seconds. Slowly return to the starting position. Repeat with your right leg and your left arm. Repeat __________ times. Complete this exercise __________ times a day. Abdominal set with straight leg raise  Lie on your back on a firm bed or the floor. Bend one of your knees and keep your other leg straight. Tense your abdominal muscles and lift your straight leg up, 4-6 inches (10-15 cm) off the ground. Keep your abdominal muscles tight and hold this position for __________ seconds. Do not hold your breath. Do not arch your back. Keep it flat against the ground. Keep your abdominal muscles tense as you slowly lower your leg back to the starting position. Repeat with your other leg. Repeat __________ times. Complete this exercise __________ times a day. Single leg lower with bent knees Lie on your back on a firm bed or the floor. Tense your abdominal muscles and lift your feet off the floor, one foot at a time, so your knees and hips are bent in 90-degree angles (right angles). Your knees should be over your hips and your lower legs should be parallel to the floor. Keeping your abdominal muscles tense and your knee bent, slowly lower one of your legs so your toe touches the ground. Lift your leg back up to return to the starting position. Do not hold your breath. Do not let your back arch. Keep your back flat against the ground. Repeat with your other leg. Repeat __________ times. Complete this exercise __________ times a day. Posture and body mechanics Good posture and healthy body mechanics can help to relieve stress in your body's tissues and joints. Body mechanics refers to the movements and positions of your body while you do your daily activities. Posture is part of body  mechanics. Good posture means: Your spine is in its natural S-curve position (neutral). Your shoulders are pulled back slightly. Your head is not tipped forward (neutral). Follow these guidelines to improve your posture and body mechanics in your everyday activities. Standing  When standing, keep your spine neutral and your feet about hip-width apart. Keep a slight bend in your knees. Your ears, shoulders, and hips should line up. When you do a task in which you stand in one place for a long time, place one foot up on a stable object that is 2-4 inches (5-10 cm) high, such as a footstool. This helps keep your spine neutral. Sitting  When sitting, keep your spine neutral and keep your feet flat on the floor. Use a footrest, if necessary, and keep your thighs parallel to the floor. Avoid rounding your shoulders, and avoid tilting your head forward. When working at a desk or a computer, keep your desk at a height where your hands are slightly lower than your elbows. Slide your   chair under your desk so you are close enough to maintain good posture. When working at a computer, place your monitor at a height where you are looking straight ahead and you do not have to tilt your head forward or downward to look at the screen. Resting When lying down and resting, avoid positions that are most painful for you. If you have pain with activities such as sitting, bending, stooping, or squatting, lie in a position in which your body does not bend very much. For example, avoid curling up on your side with your arms and knees near your chest (fetal position). If you have pain with activities such as standing for a long time or reaching with your arms, lie with your spine in a neutral position and bend your knees slightly. Try the following positions: Lying on your side with a pillow between your knees. Lying on your back with a pillow under your knees. Lifting  When lifting objects, keep your feet at least  shoulder-width apart and tighten your abdominal muscles. Bend your knees and hips and keep your spine neutral. It is important to lift using the strength of your legs, not your back. Do not lock your knees straight out. Always ask for help to lift heavy or awkward objects. This information is not intended to replace advice given to you by your health care provider. Make sure you discuss any questions you have with your health care provider. Document Revised: 07/13/2020 Document Reviewed: 07/13/2020 Elsevier Patient Education  [place]. Neck Exercises Ask your health care provider which exercises are safe for you. Do exercises exactly as told by your health care provider and adjust them as directed. It is normal to feel mild stretching, pulling, tightness, or discomfort as you do these exercises. Stop right away if you feel sudden pain or your pain gets worse. Do not begin these exercises until told by your health care provider. Neck exercises can be important for many reasons. They can improve strength and maintain flexibility in your neck, which will help your upper back and prevent neck pain. Stretching exercises Rotation neck stretching  Sit in a chair or stand up. Place your feet flat on the floor, shoulder-width apart. Slowly turn your head (rotate) to the right until a slight stretch is felt. Turn it all the way to the right so you can look over your right shoulder. Do not tilt or tip your head. Hold this position for 10-30 seconds. Slowly turn your head (rotate) to the left until a slight stretch is felt. Turn it all the way to the left so you can look over your left shoulder. Do not tilt or tip your head. Hold this position for 10-30 seconds. Repeat __________ times. Complete this exercise __________ times a day. Neck retraction  Sit in a sturdy chair or stand up. Look straight ahead. Do not bend your neck. Use your fingers to push your chin backward (retraction). Do not bend  your neck for this movement. Continue to face straight ahead. If you are doing the exercise properly, you will feel a slight sensation in your throat and a stretch at the back of your neck. Hold the stretch for 1-2 seconds. Repeat __________ times. Complete this exercise __________ times a day. Strengthening exercises Neck press  Lie on your back on a firm bed or on the floor with a pillow under your head. Use your neck muscles to push your head down on the pillow and straighten your spine. Hold the  position as well as you can. Keep your head facing up (in a neutral position) and your chin tucked. Slowly count to 5 while holding this position. Repeat __________ times. Complete this exercise __________ times a day. Isometrics These are exercises in which you strengthen the muscles in your neck while keeping your neck still (isometrics). Sit in a supportive chair and place your hand on your forehead. Keep your head and face facing straight ahead. Do not flex or extend your neck while doing isometrics. Push forward with your head and neck while pushing back with your hand. Hold for 10 seconds. Do the sequence again, this time putting your hand against the back of your head. Use your head and neck to push backward against the hand pressure. Finally, do the same exercise on either side of your head, pushing sideways against the pressure of your hand. Repeat __________ times. Complete this exercise __________ times a day. Prone head lifts  Lie face-down (prone position), resting on your elbows so that your chest and upper back are raised. Start with your head facing downward, near your chest. Position your chin either on or near your chest. Slowly lift your head upward. Lift until you are looking straight ahead. Then continue lifting your head as far back as you can comfortably stretch. Hold your head up for 5 seconds. Then slowly lower it to your starting position. Repeat __________ times.  Complete this exercise __________ times a day. Supine head lifts  Lie on your back (supine position), bending your knees to point to the ceiling and keeping your feet flat on the floor. Lift your head slowly off the floor, raising your chin toward your chest. Hold for 5 seconds. Repeat __________ times. Complete this exercise __________ times a day. Scapular retraction  Stand with your arms at your sides. Look straight ahead. Slowly pull both shoulders (scapulae) backward and downward (retraction) until you feel a stretch between your shoulder blades in your upper back. Hold for 10-30 seconds. Relax and repeat. Repeat __________ times. Complete this exercise __________ times a day. Contact a health care provider if: Your neck pain or discomfort gets worse when you do an exercise. Your neck pain or discomfort does not improve within 2 hours after you exercise. If you have any of these problems, stop exercising right away. Do not do the exercises again unless your health care provider says that you can. Get help right away if: You develop sudden, severe neck pain. If this happens, stop exercising right away. Do not do the exercises again unless your health care provider says that you can. This information is not intended to replace advice given to you by your health care provider. Make sure you discuss any questions you have with your health care provider. Document Revised: 10/20/2020 Document Reviewed: 10/20/2020 Elsevier Patient Education  2023 [employer].

## 2022-06-21 NOTE — Progress Notes (Signed)
Established patient visit   Patient: Tami Foster   DOB: July 10, 1980   42 y.o. Female  MRN: [id number] Visit Date: 06/22/2022   Chief Complaint  Patient presents with   Follow-up   Blood Pressure Check   Subjective    HPI  Follow up  -hypertension with mild tachycardia  --currently on metoprolol which manages this fairly well  -generalized anxiety --pristiq now at 50 mg daily  -takes alprazolam 1 mg at bedtime if needed  -c/o fatigue, lack of focus, not getting anything done. Increases anxiety  -weight gain is also concern -felt better when she was able to lose weight  -She denies chest pain, chest pressure, or shortness of breath. She denies headaches or visual disturbances. She denies abdominal pain, nausea, vomiting, or changes in bowel or bladder habits.     Medications: Outpatient Medications Prior to Visit  Medication Sig   desvenlafaxine (PRISTIQ) 50 MG 24 hr tablet TAKE 1 TABLET BY MOUTH EVERY DAY   metoprolol succinate (TOPROL-XL) 50 MG 24 hr tablet Take 1 tablet (50 mg total) by mouth daily. Take with or immediately following a meal.   [DISCONTINUED] ALPRAZolam (XANAX) 1 MG tablet Take 1 tablet (1 mg total) by mouth at bedtime as needed for anxiety.   [DISCONTINUED] azithromycin (ZITHROMAX) 250 MG tablet z-pack - take as directed for 5 days   [DISCONTINUED] diclofenac (VOLTAREN) 75 MG EC tablet Take 1 tablet (75 mg total) by mouth 2 (two) times daily as needed.   [DISCONTINUED] methocarbamol (ROBAXIN-750) 750 MG tablet Take 1 tablet (750 mg total) by mouth 2 (two) times daily as needed for muscle spasms.   [DISCONTINUED] predniSONE (STERAPRED UNI-PAK 21 TAB) 10 MG (21) TBPK tablet 6 day taper - take by mouth as directed for 6 days   No facility-administered medications prior to visit.    Review of Systems See HPI     Last CBC Lab Results  Component Value Date   WBC 9.0 02/08/2022   HGB 15.6 02/08/2022   HCT 44.9 02/08/2022   MCV 95 02/08/2022   MCH 32.9  02/08/2022   RDW 11.8 02/08/2022   PLT 208 [id number]   Last metabolic panel Lab Results  Component Value Date   GLUCOSE 100 (H) 02/08/2022   NA 139 02/08/2022   K 4.2 02/08/2022   CL 104 02/08/2022   CO2 19 (L) 02/08/2022   BUN 13 02/08/2022   CREATININE 0.60 02/08/2022   EGFR 117 02/08/2022   CALCIUM 9.1 02/08/2022   PROT 6.8 02/08/2022   ALBUMIN 4.5 02/08/2022   LABGLOB 2.3 02/08/2022   AGRATIO 2.0 02/08/2022   BILITOT 0.6 02/08/2022   ALKPHOS 94 02/08/2022   AST 15 02/08/2022   ALT 12 02/08/2022   ANIONGAP 8 05/04/2020   Last lipids Lab Results  Component Value Date   CHOL 208 (H) 09/30/2021   HDL 68 09/30/2021   LDLCALC 127 (H) 09/30/2021   TRIG 73 09/30/2021   CHOLHDL 3.1 09/30/2021   Last hemoglobin A1c Lab Results  Component Value Date   HGBA1C 5.4 02/08/2022   Last thyroid functions Lab Results  Component Value Date   TSH 0.729 02/08/2022   T3TOTAL 97.9 07/01/2013   Last vitamin D Lab Results  Component Value Date   VD25OH 31.1 02/08/2022       Objective     Today's Vitals   06/22/22 1011  BP: 116/81  Pulse: 89  SpO2: 96%  Weight: 163 lb 1.9 oz (74 kg)  Height: 5' 1.81" (  1.57 m)   Body mass index is 30.02 kg/m.  BP Readings from Last 3 Encounters:  06/22/22 116/81  03/22/22 116/80  02/08/22 111/75    Wt Readings from Last 3 Encounters:  06/22/22 163 lb 1.9 oz (74 kg)  03/22/22 163 lb (73.9 kg)  02/08/22 162 lb 6.4 oz (73.7 kg)    Physical Exam Vitals and nursing note reviewed.  Constitutional:      Appearance: Normal appearance. She is well-developed.  HENT:     Head: Normocephalic and atraumatic.  Eyes:     Pupils: Pupils are equal, round, and reactive to light.  Cardiovascular:     Rate and Rhythm: Normal rate and regular rhythm.     Pulses: Normal pulses.     Heart sounds: Normal heart sounds.  Pulmonary:     Effort: Pulmonary effort is normal.     Breath sounds: Normal breath sounds.  Abdominal:     Palpations:  Abdomen is soft.  Musculoskeletal:        General: Normal range of motion.     Cervical back: Normal range of motion and neck supple.  Lymphadenopathy:     Cervical: No cervical adenopathy.  Skin:    General: Skin is warm and dry.     Capillary Refill: Capillary refill takes less than 2 seconds.  Neurological:     General: No focal deficit present.     Mental Status: She is alert and oriented to person, place, and time.  Psychiatric:        Attention and Perception: Attention and perception normal.        Mood and Affect: Affect normal. Mood is anxious.        Speech: Speech normal.        Behavior: Behavior normal. Behavior is cooperative.        Thought Content: Thought content normal.        Cognition and Memory: Cognition and memory normal.        Judgment: Judgment normal.       Assessment & Plan    1. Paroxysmal tachycardia (HCC) Stable with current dose metoprolol.   2. Other fatigue Add wellbutrin XL 150 mg daily. Reassess in 6 weeks  - buPROPion (WELLBUTRIN XL) 150 MG 24 hr tablet; Take 1 tablet (150 mg total) by mouth daily.  Dispense: 30 tablet; Refill: 2  3. Generalized anxiety disorder Increase wellbutrin XL 150 mg daily. Continue pristiq 50 mg daily. May take alprazolam 1 mg at bedtime as needed. Reassess in 6 months.  - buPROPion (WELLBUTRIN XL) 150 MG 24 hr tablet; Take 1 tablet (150 mg total) by mouth daily.  Dispense: 30 tablet; Refill: 2 - ALPRAZolam (XANAX) 1 MG tablet; Take 1 tablet (1 mg total) by mouth at bedtime as needed for anxiety.  Dispense: 30 tablet; Refill: 2   Problem List Items Addressed This Visit       Cardiovascular and Mediastinum   Paroxysmal tachycardia (HCC) - Primary     Other   Other fatigue   Relevant Medications   buPROPion (WELLBUTRIN XL) 150 MG 24 hr tablet   Generalized anxiety disorder   Relevant Medications   buPROPion (WELLBUTRIN XL) 150 MG 24 hr tablet   ALPRAZolam (XANAX) 1 MG tablet     Return in about 6 weeks  (around 08/03/2022) for mood. added wellbutrin .         Ronnell Freshwater, NP  [facility] at [facility] [phone] (phone) [phone] (fax)   [facility]  

## 2022-06-22 ENCOUNTER — Ambulatory Visit (INDEPENDENT_AMBULATORY_CARE_PROVIDER_SITE_OTHER): Payer: Medicaid Other | Admitting: Nurse Practitioner

## 2022-06-22 ENCOUNTER — Encounter: Payer: Self-pay | Admitting: Nurse Practitioner

## 2022-06-22 VITALS — BP 116/81 | HR 89 | Ht 61.81 in | Wt 163.1 lb

## 2022-06-22 DIAGNOSIS — F411 Generalized anxiety disorder: Secondary | ICD-10-CM

## 2022-06-22 DIAGNOSIS — R5383 Other fatigue: Secondary | ICD-10-CM | POA: Diagnosis not present

## 2022-06-22 DIAGNOSIS — I479 Paroxysmal tachycardia, unspecified: Secondary | ICD-10-CM | POA: Diagnosis not present

## 2022-06-22 MED ORDER — BUPROPION HCL ER (XL) 150 MG PO TB24: 150.0000 mg | ORAL_TABLET | Freq: Every day | ORAL | 2 refills | Status: DC

## 2022-06-22 MED ORDER — ALPRAZOLAM 1 MG PO TABS: 1.0000 mg | ORAL_TABLET | Freq: Every evening | ORAL | 2 refills | Status: DC | PRN

## 2022-07-15 ENCOUNTER — Other Ambulatory Visit: Payer: Self-pay | Admitting: Nurse Practitioner

## 2022-07-15 DIAGNOSIS — R5383 Other fatigue: Secondary | ICD-10-CM

## 2022-07-15 DIAGNOSIS — F411 Generalized anxiety disorder: Secondary | ICD-10-CM

## 2022-08-03 ENCOUNTER — Telehealth (INDEPENDENT_AMBULATORY_CARE_PROVIDER_SITE_OTHER): Payer: Medicaid Other | Admitting: Nurse Practitioner

## 2022-08-03 ENCOUNTER — Encounter: Payer: Self-pay | Admitting: Nurse Practitioner

## 2022-08-03 VITALS — BP 137/97 | HR 96 | Ht 61.81 in | Wt 163.0 lb

## 2022-08-03 DIAGNOSIS — M1990 Unspecified osteoarthritis, unspecified site: Secondary | ICD-10-CM | POA: Insufficient documentation

## 2022-08-03 DIAGNOSIS — R5383 Other fatigue: Secondary | ICD-10-CM

## 2022-08-03 DIAGNOSIS — F411 Generalized anxiety disorder: Secondary | ICD-10-CM | POA: Diagnosis not present

## 2022-08-03 MED ORDER — BUPROPION HCL ER (XL) 300 MG PO TB24: 300.0000 mg | ORAL_TABLET | Freq: Every day | ORAL | 2 refills | Status: DC

## 2022-08-03 MED ORDER — CELECOXIB 200 MG PO CAPS: 200.0000 mg | ORAL_CAPSULE | Freq: Two times a day (BID) | ORAL | 2 refills | Status: DC | PRN

## 2022-08-03 NOTE — Progress Notes (Signed)
Virtual Visit via Telephone Note  I connected with Tami Foster on 08/03/22 at  9:30 AM EDT by telephone and verified that I am speaking with the correct person using two identifiers.  Location: Patient: home  Provider: [facility]     I discussed the limitations, risks, security and privacy concerns of performing an evaluation and management service by telephone and the availability of in person appointments. I also discussed with the patient that there may be a patient responsible charge related to this service. The patient expressed understanding and agreed to proceed.     Patient: Tami Foster   DOB: 01/23/1981   42 y.o. Female  MRN: [id number] Visit Date: 08/03/2022   No chief complaint on file.  Subjective    HPI  Follow up  Depression/anxiety -added wellbutrin XL 150 mg daily at most recent visit -noted improvement when first started, not so much improvement now. -has cut back on her smoking.  -continues Pristiq 50 mg daily  -feels fatigue -feels "bogged down."  -does feel increased personal stress    Generalized joint pain  -multiple joints bothering her.  -was given prescriptions for voltaren and robaxin at most recent visit to orthopedic -insurance will not cover voltaren. Has not been taking anything to help with pain   -She denies chest pain, chest pressure, or shortness of breath. She denies headaches or visual disturbances. She denies abdominal pain, nausea, vomiting, or changes in bowel or bladder habits.     Medications: Outpatient Medications Prior to Visit  Medication Sig   ALPRAZolam (XANAX) 1 MG tablet Take 1 tablet (1 mg total) by mouth at bedtime as needed for anxiety.   desvenlafaxine (PRISTIQ) 50 MG 24 hr tablet TAKE 1 TABLET BY MOUTH EVERY DAY   metoprolol succinate (TOPROL-XL) 50 MG 24 hr tablet Take 1 tablet (50 mg total) by mouth daily. Take with or immediately following a meal.   [DISCONTINUED] buPROPion  (WELLBUTRIN XL) 150 MG 24 hr tablet Take 1 tablet (150 mg total) by mouth daily.   No facility-administered medications prior to visit.    Review of Systems See HPI     Objective     Today's Vitals   08/03/22 0923  BP: (Abnormal) 137/97  Pulse: 96  Weight: 163 lb (73.9 kg)  Height: 5' 1.81" (1.57 m)   Body mass index is 30 kg/m.  BP Readings from Last 3 Encounters:  08/03/22 (Abnormal) 137/97  06/22/22 116/81  03/22/22 116/80    Wt Readings from Last 3 Encounters:  08/03/22 163 lb (73.9 kg)  06/22/22 163 lb 1.9 oz (74 kg)  03/22/22 163 lb (73.9 kg)    Physical Exam   The patient is alert and oriented. She is pleasant and answers all questions appropriately. Breathing is non-labored. She is in no acute distress at this time.      Assessment & Plan    Generalized anxiety disorder Assessment & Plan: Increase Wellbutrin XL to 300 mg daily. Continue Pristiq 50 mg daily. May continue to take alprazolam at bedtime as needed.   Orders: -     buPROPion HCl ER (XL); Take 1 tablet (300 mg total) by mouth daily.  Dispense: 30 tablet; Refill: 2  Other fatigue Assessment & Plan: Increase Wellbutrin XL to 300 mg every evening.   Orders: -     buPROPion HCl ER (XL); Take 1 tablet (300 mg total) by mouth daily.  Dispense: 30 tablet; Refill: 2  Chronic inflammatory  arthritis Assessment & Plan: Start celebrex 200 mg twice daily as needed for pain/inflammation. Encouraged her to schedule f/u appointment with orthopedic provider for further evaluation.   Orders: -     Celecoxib; Take 1 capsule (200 mg total) by mouth 2 (two) times daily as needed.  Dispense: 60 capsule; Refill: 2     Return in about 2 months (around 10/03/2022) for mood, blood pressure.      I discussed the assessment and treatment plan with the patient. The patient was provided an opportunity to ask questions and all were answered. The patient agreed with the plan and demonstrated an understanding of the  instructions.   The patient was advised to call back or seek an in-person evaluation if the symptoms worsen or if the condition fails to improve as anticipated.  I provided 15 minutes of non-face-to-face time during this encounter.   Ronnell Freshwater, NPEstablished patient visit   Ronnell Freshwater, NP  [facility] at [facility] [phone] (phone) [phone] (fax)  [place]

## 2022-08-03 NOTE — Assessment & Plan Note (Signed)
Increase Wellbutrin XL to 300 mg daily. Continue Pristiq 50 mg daily. May continue to take alprazolam at bedtime as needed.

## 2022-08-03 NOTE — Assessment & Plan Note (Signed)
Increase Wellbutrin XL to 300 mg every evening.

## 2022-08-03 NOTE — Assessment & Plan Note (Signed)
Start celebrex 200 mg twice daily as needed for pain/inflammation. Encouraged her to schedule f/u appointment with orthopedic provider for further evaluation.

## 2022-08-25 ENCOUNTER — Other Ambulatory Visit: Payer: Self-pay | Admitting: Nurse Practitioner

## 2022-08-25 DIAGNOSIS — F411 Generalized anxiety disorder: Secondary | ICD-10-CM

## 2022-08-25 DIAGNOSIS — R5383 Other fatigue: Secondary | ICD-10-CM

## 2022-09-19 ENCOUNTER — Other Ambulatory Visit: Payer: Self-pay | Admitting: Nurse Practitioner

## 2022-09-19 ENCOUNTER — Encounter: Payer: Self-pay | Admitting: Orthopaedic Surgery

## 2022-09-19 DIAGNOSIS — F411 Generalized anxiety disorder: Secondary | ICD-10-CM

## 2022-09-30 ENCOUNTER — Ambulatory Visit: Payer: Medicaid Other | Admitting: Orthopaedic Surgery

## 2022-10-06 ENCOUNTER — Ambulatory Visit (INDEPENDENT_AMBULATORY_CARE_PROVIDER_SITE_OTHER): Payer: Medicaid Other | Admitting: Nurse Practitioner

## 2022-10-06 ENCOUNTER — Encounter: Payer: Self-pay | Admitting: Nurse Practitioner

## 2022-10-06 VITALS — BP 131/76 | HR 82 | Ht 61.81 in | Wt 171.1 lb

## 2022-10-06 DIAGNOSIS — N926 Irregular menstruation, unspecified: Secondary | ICD-10-CM | POA: Diagnosis not present

## 2022-10-06 DIAGNOSIS — R635 Abnormal weight gain: Secondary | ICD-10-CM | POA: Diagnosis not present

## 2022-10-06 DIAGNOSIS — F411 Generalized anxiety disorder: Secondary | ICD-10-CM

## 2022-10-06 DIAGNOSIS — E282 Polycystic ovarian syndrome: Secondary | ICD-10-CM | POA: Diagnosis not present

## 2022-10-06 DIAGNOSIS — R5383 Other fatigue: Secondary | ICD-10-CM | POA: Diagnosis not present

## 2022-10-06 MED ORDER — TOPIRAMATE 25 MG PO TABS: 25.0000 mg | ORAL_TABLET | Freq: Every day | ORAL | 1 refills | Status: DC

## 2022-10-06 NOTE — Patient Instructions (Signed)
Supplement recommendations -  Maca - improves stress level, energy level, libido  B complex vitamin - improves energy and metabolism Ashwaganda - improves stress and energy Collagen -  improves joint health and inflammation  Tumeric - improves inflammation

## 2022-10-06 NOTE — Progress Notes (Signed)
Established patient visit   Patient: Tami Foster   DOB: 1981/02/13   42 y.o. Female  MRN: [id number] Visit Date: 10/06/2022   Chief Complaint  Patient presents with   Medical Management of Chronic Issues   Subjective    HPI  Follow up  -hypertension with mild tachycardia  --currently on metoprolol which manages this fairly well  -generalized anxiety --pristiq now at 50 mg daily  -takes alprazolam 1 mg at bedtime if needed  -c/o fatigue, lack of focus, not getting anything done. Increases anxiety  -weight gain is also concern -Had added Wellbutrin XL 150 mg at most recent visit to help with anxiety and help with weight management.  This is since been increased to 300 mg XL daily. -She has had weight gain of 8 pounds since her most recent visit. -feels fatigued  -She denies chest pain, chest pressure, or shortness of breath. She denies headaches or visual disturbances. She denies abdominal pain, nausea, vomiting, or changes in bowel or bladder habits.     Medications: Outpatient Medications Prior to Visit  Medication Sig   ALPRAZolam (XANAX) 1 MG tablet TAKE 1 TABLET BY MOUTH AT BEDTIME AS NEEDED FOR ANXIETY.   buPROPion (WELLBUTRIN XL) 300 MG 24 hr tablet Take 1 tablet (300 mg total) by mouth daily.   celecoxib (CELEBREX) 200 MG capsule Take 1 capsule (200 mg total) by mouth 2 (two) times daily as needed.   desvenlafaxine (PRISTIQ) 50 MG 24 hr tablet TAKE 1 TABLET BY MOUTH EVERY DAY   metoprolol succinate (TOPROL-XL) 50 MG 24 hr tablet Take 1 tablet (50 mg total) by mouth daily. Take with or immediately following a meal.   No facility-administered medications prior to visit.    Review of Systems See HPI    Last CBC Lab Results  Component Value Date   WBC 9.0 02/08/2022   HGB 15.6 02/08/2022   HCT 44.9 02/08/2022   MCV 95 02/08/2022   MCH 32.9 02/08/2022   RDW 11.8 02/08/2022   PLT 208 02/08/2022   Last metabolic panel Lab Results  Component Value Date    GLUCOSE 100 (H) 02/08/2022   NA 139 02/08/2022   K 4.2 02/08/2022   CL 104 02/08/2022   CO2 19 (L) 02/08/2022   BUN 13 02/08/2022   CREATININE 0.60 02/08/2022   EGFR 117 02/08/2022   CALCIUM 9.1 02/08/2022   PROT 6.8 02/08/2022   ALBUMIN 4.5 02/08/2022   LABGLOB 2.3 02/08/2022   AGRATIO 2.0 02/08/2022   BILITOT 0.6 02/08/2022   ALKPHOS 94 02/08/2022   AST 15 02/08/2022   ALT 12 02/08/2022   ANIONGAP 8 05/04/2020   Last lipids Lab Results  Component Value Date   CHOL 208 (H) 09/30/2021   HDL 68 09/30/2021   LDLCALC 127 (H) 09/30/2021   TRIG 73 09/30/2021   CHOLHDL 3.1 09/30/2021   Last hemoglobin A1c Lab Results  Component Value Date   HGBA1C 5.4 02/08/2022   Last thyroid functions Lab Results  Component Value Date   TSH 0.729 02/08/2022   T3TOTAL 97.9 07/01/2013   Last vitamin D Lab Results  Component Value Date   VD25OH 31.1 02/08/2022       Objective     Today's Vitals   10/06/22 1013  BP: 131/76  Pulse: 82  SpO2: 96%  Weight: 171 lb 1.9 oz (77.6 kg)  Height: 5' 1.81" (1.57 m)   Body mass index is 31.49 kg/m.  BP Readings from Last 3 Encounters:  10/06/22  131/76  08/03/22 (Abnormal) 137/97  06/22/22 116/81    Wt Readings from Last 3 Encounters:  10/06/22 171 lb 1.9 oz (77.6 kg)  08/03/22 163 lb (73.9 kg)  06/22/22 163 lb 1.9 oz (74 kg)    Physical Exam Vitals and nursing note reviewed.  Constitutional:      Appearance: Normal appearance. She is well-developed.  HENT:     Head: Normocephalic and atraumatic.     Nose: Nose normal.     Mouth/Throat:     Mouth: Mucous membranes are moist.     Pharynx: Oropharynx is clear.  Eyes:     Extraocular Movements: Extraocular movements intact.     Conjunctiva/sclera: Conjunctivae normal.     Pupils: Pupils are equal, round, and reactive to light.  Neck:     Vascular: No carotid bruit.  Cardiovascular:     Rate and Rhythm: Normal rate and regular rhythm.     Pulses: Normal pulses.     Heart  sounds: Normal heart sounds.  Pulmonary:     Effort: Pulmonary effort is normal.     Breath sounds: Normal breath sounds.  Abdominal:     Palpations: Abdomen is soft.  Musculoskeletal:        General: Normal range of motion.     Cervical back: Normal range of motion and neck supple.  Lymphadenopathy:     Cervical: No cervical adenopathy.  Skin:    General: Skin is warm and dry.     Capillary Refill: Capillary refill takes less than 2 seconds.  Neurological:     General: No focal deficit present.     Mental Status: She is alert and oriented to person, place, and time.  Psychiatric:        Attention and Perception: Attention and perception normal.        Mood and Affect: Affect normal. Mood is anxious.        Speech: Speech normal.        Behavior: Behavior normal. Behavior is cooperative.        Thought Content: Thought content normal.        Cognition and Memory: Cognition and memory normal.        Judgment: Judgment normal.     Results for orders placed or performed in visit on 10/06/22  Cortisol  Result Value Ref Range   Cortisol 6.6 6.2 - 19.4 ug/dL  Hormone Panel  Result Value Ref Range   TSH 1.2 uU/mL   T4 7.7 ug/dL   Free T-3 3.4 pg/mL   Triiodothyronine (T-3), Serum 89 ng/dL   Testosterone, Serum (Total) 84 (H) ng/dL   Free Testosterone, Serum 8.4 (H) pg/mL   Testosterone-% Free 1.0 %   Follicle Stimulating Hormone 6.2 mIU/mL   Progesterone, Serum 236 ng/dL   DHEA-Sulfate, LCMS 97 ug/dL   Sex Hormone Binding Globulin 57.9 nmol/L   Estrone Sulfate WILL FOLLOW    Estradiol, Serum, MS 114 pg/mL    Assessment & Plan    Other fatigue Assessment & Plan: Check labs for further evaluation.  Orders: -     Hormone Panel; Future -     Cortisol; Future  Abnormal weight gain Assessment & Plan: Check labs including thyroid panel for further evaluation.  Orders: -     Topiramate; Take 1 tablet (25 mg total) by mouth at bedtime.  Dispense: 30 tablet; Refill: 1 -      Hormone Panel; Future -     Cortisol; Future  Generalized anxiety disorder Assessment &  Plan: Improved and stable.  -Continue Wellbutrin 300 mg daily.   Continue Pristiq 50 mg daily.  -May continue to take alprazolam at bedtime as needed.   Orders: -     Cortisol; Future  Irregular periods/menstrual cycles Assessment & Plan: Check labs with reproductive hormones for further evaluation.  Orders: -     Hormone Panel; Future  PCOS (polycystic ovarian syndrome) Assessment & Plan: Check labs for further evaluation. -Consider referral to GYN if indicated.  Orders: -     Hormone Panel; Future -     Cortisol; Future     Return in about 4 weeks (around 11/03/2022) for routine - weight management.         Carlean Jews, NP  [facility] [phone] (phone) [phone] (fax)  [facility]

## 2022-10-07 LAB — HORMONE PANEL (T4,TSH,FSH,TESTT,SHBG,DHEA,ETC)

## 2022-10-11 ENCOUNTER — Ambulatory Visit (INDEPENDENT_AMBULATORY_CARE_PROVIDER_SITE_OTHER): Payer: Medicaid Other | Admitting: Orthopaedic Surgery

## 2022-10-11 ENCOUNTER — Other Ambulatory Visit (INDEPENDENT_AMBULATORY_CARE_PROVIDER_SITE_OTHER): Payer: Medicaid Other

## 2022-10-11 ENCOUNTER — Encounter: Payer: Self-pay | Admitting: Orthopaedic Surgery

## 2022-10-11 DIAGNOSIS — G8929 Other chronic pain: Secondary | ICD-10-CM | POA: Diagnosis not present

## 2022-10-11 DIAGNOSIS — M25522 Pain in left elbow: Secondary | ICD-10-CM

## 2022-10-11 DIAGNOSIS — M25512 Pain in left shoulder: Secondary | ICD-10-CM

## 2022-10-11 LAB — HORMONE PANEL (T4,TSH,FSH,TESTT,SHBG,DHEA,ETC): Sex Hormone Binding Globulin: 57.9 nmol/L

## 2022-10-11 NOTE — Progress Notes (Signed)
Office Visit Note   Patient: Tami Foster           Date of Birth: 04-19-1981           MRN: [id number] Visit Date: 10/11/2022              Requested by: Carlean Jews, NP [address] [place],  [place] [address] PCP: Carlean Jews, NP   Assessment & Plan: Visit Diagnoses:  1. Chronic left shoulder pain   2. Pain in left elbow     Plan: My impression of the shoulder pain is compensation for the elbow pain.  I do not think there is truly a structural problem there.  For the elbow treatment options were discussed again and she states that the pain is overall manageable send I do not recommend a cortisone injection.  We will give her new resistance bands so that she can continue to do home exercises for the elbow.  She will continue to use counterforce brace.  Follow-up as needed.  Follow-Up Instructions: No follow-ups on file.   Orders:  Orders Placed This Encounter  Procedures   XR Shoulder Left   No orders of the defined types were placed in this encounter.     Procedures: No procedures performed   Clinical Data: No additional findings.   Subjective: Chief Complaint  Patient presents with   Left Elbow - Pain   Left Shoulder - Pain    HPI Tami Foster returns today for follow-up on left lateral epicondylitis and new problem of left shoulder pain.  She has had to alter how she does daily activities because the elbow pain.  Cortisone injection helped for about 2 and half months.  This was done about 6 months ago. Review of Systems  Constitutional: Negative.   HENT: Negative.    Eyes: Negative.   Respiratory: Negative.    Cardiovascular: Negative.   Endocrine: Negative.   Musculoskeletal: Negative.   Neurological: Negative.   Hematological: Negative.   Psychiatric/Behavioral: Negative.    All other systems reviewed and are negative.    Objective: Vital Signs: LMP 09/18/2022   Physical Exam Vitals and nursing note reviewed.   Constitutional:      Appearance: She is well-developed.  HENT:     Head: Normocephalic and atraumatic.  Pulmonary:     Effort: Pulmonary effort is normal.  Abdominal:     Palpations: Abdomen is soft.  Musculoskeletal:     Cervical back: Neck supple.  Skin:    General: Skin is warm.     Capillary Refill: Capillary refill takes less than 2 seconds.  Neurological:     Mental Status: She is alert and oriented to person, place, and time.  Psychiatric:        Behavior: Behavior normal.        Thought Content: Thought content normal.        Judgment: Judgment normal.     Ortho Exam Examination left shoulder is unremarkable. Examination of left elbow is unchanged. Specialty Comments:  No specialty comments available.  Imaging: XR Shoulder Left  Result Date: 10/11/2022 No acute or structural abnormalities    PMFS History: Patient Active Problem List   Diagnosis Date Noted   Chronic inflammatory arthritis 08/03/2022   Paroxysmal tachycardia (HCC) 03/22/2022   Menorrhagia with irregular cycle 02/27/2022   Grief reaction 11/23/2021   Vitamin D deficiency 10/24/2021   Dyslipidemia, goal LDL below 100 10/24/2021   Palpitations 09/30/2021   Alopecia 09/30/2021  Panic attack 09/30/2021   Generalized anxiety disorder 09/30/2021   History of gestational diabetes 09/30/2021   S/P tubal ligation 02/24/2021   S/P cesarean section 12/09/2020   Delivery by cesarean section for breech presentation    History of preterm delivery 11/13/2020   H/O LEEP 06/11/2020   Sinus tachycardia 05/28/2020   Adjustment disorder with mixed anxiety and depressed mood 10/08/2017   Obesity, Class I, BMI 30-34.9 10/08/2017   Other fatigue 10/08/2017   History of female hirsutism 11/11/2015   ASCUS favor benign 11/11/2015   Hirsutism 08/05/2014   Oligomenorrhea 08/05/2014   PCOS (polycystic ovarian syndrome) 08/05/2014   Abnormal weight gain 08/05/2014   Smoker 08/05/2014   Anxiety 05/06/2013    Past Medical History:  Diagnosis Date   Abnormal Pap smear    Anxiety    Depression    Family history of adverse reaction to anesthesia    sister difficult to wake   GERD (gastroesophageal reflux disease)    Gestational diabetes    Hypertension    h/o prior to pregnancy-pt has now delivered and bp is under control-Pt will ask Dr Velora Mediate ob/gyn if she needs to restart this   Spinal headache    after epidural with section on 12-03-20-had blood patch and ha resolved   Tachycardia     Family History  Problem Relation Age of Onset   Cancer Mother        bladder   Hypertension Mother    Emphysema Father    Hypertension Father    Cancer Father 12       Colon   Diabetes Paternal Grandmother    Other Neg Hx     Past Surgical History:  Procedure Laterality Date   CESAREAN SECTION N/A 12/03/2020   Procedure: CESAREAN SECTION;  Surgeon: Nadara Mustard, MD;  Location: [facility] ORS;  Service: Obstetrics;  Laterality: N/A;   LAPAROSCOPIC BILATERAL SALPINGECTOMY Bilateral 02/11/2021   Procedure: LAPAROSCOPIC BILATERAL SALPINGECTOMY;  Surgeon: Nadara Mustard, MD;  Location: [facility] ORS;  Service: Gynecology;  Laterality: Bilateral;   LEEP     PERIURETHRAL ABSCESS DRAINAGE     abscess removed from tonsils. Tonsils not removed   WISDOM TOOTH EXTRACTION     Social History   Occupational History   Not on file  Tobacco Use   Smoking status: Every Day    Packs/day: 0.50    Years: 14.00    Additional pack years: 0.00    Total pack years: 7.00    Types: Cigarettes   Smokeless tobacco: Never  Vaping Use   Vaping Use: Former   Substances: Nicotine, Flavoring  Substance and Sexual Activity   Alcohol use: Yes    Comment: occ   Drug use: No   Sexual activity: Yes    Partners: Male    Birth control/protection: None    Comment: 1st intercourse- 19, partners- 6

## 2022-10-12 LAB — HORMONE PANEL (T4,TSH,FSH,TESTT,SHBG,DHEA,ETC): Triiodothyronine (T-3), Serum: 89 ng/dL

## 2022-10-13 LAB — HORMONE PANEL (T4,TSH,FSH,TESTT,SHBG,DHEA,ETC): TSH: 1.2 uU/mL

## 2022-10-14 LAB — HORMONE PANEL (T4,TSH,FSH,TESTT,SHBG,DHEA,ETC)
DHEA-Sulfate, LCMS: 97 ug/dL
Progesterone, Serum: 236 ng/dL
Testosterone-% Free: 1 %

## 2022-10-19 ENCOUNTER — Encounter: Payer: Self-pay | Admitting: Nurse Practitioner

## 2022-10-23 DIAGNOSIS — N926 Irregular menstruation, unspecified: Secondary | ICD-10-CM | POA: Insufficient documentation

## 2022-10-23 NOTE — Assessment & Plan Note (Signed)
Check labs for further evaluation.  

## 2022-10-23 NOTE — Assessment & Plan Note (Signed)
>>  ASSESSMENT AND PLAN FOR IRREGULAR PERIODS/MENSTRUAL CYCLES WRITTEN ON 10/23/2022  5:16 PM BY BOSCIA, HEATHER E, NP  Check labs with reproductive hormones for further evaluation.

## 2022-10-23 NOTE — Assessment & Plan Note (Signed)
Improved and stable.  -Continue Wellbutrin 300 mg daily.   Continue Pristiq 50 mg daily.  -May continue to take alprazolam at bedtime as needed.

## 2022-10-23 NOTE — Assessment & Plan Note (Signed)
Check labs with reproductive hormones for further evaluation.

## 2022-10-23 NOTE — Assessment & Plan Note (Signed)
>>  ASSESSMENT AND PLAN FOR ABNORMAL WEIGHT GAIN WRITTEN ON 10/23/2022  5:15 PM BY BOSCIA, HEATHER E, NP  Check labs including thyroid panel for further evaluation.

## 2022-10-23 NOTE — Assessment & Plan Note (Signed)
Check labs for further evaluation. -Consider referral to GYN if indicated.

## 2022-10-23 NOTE — Assessment & Plan Note (Signed)
Check labs including thyroid panel for further evaluation.  

## 2022-10-24 LAB — CORTISOL: Cortisol: 6.6 ug/dL (ref 6.2–19.4)

## 2022-10-24 LAB — HORMONE PANEL (T4,TSH,FSH,TESTT,SHBG,DHEA,ETC)
Free T-3: 3.4 pg/mL
T4: 7.7 ug/dL

## 2022-10-25 ENCOUNTER — Ambulatory Visit (INDEPENDENT_AMBULATORY_CARE_PROVIDER_SITE_OTHER): Payer: Medicaid Other | Admitting: Nurse Practitioner

## 2022-10-25 ENCOUNTER — Encounter: Payer: Self-pay | Admitting: Nurse Practitioner

## 2022-10-25 VITALS — BP 111/76 | HR 97 | Ht 61.81 in | Wt 167.1 lb

## 2022-10-25 DIAGNOSIS — R0981 Nasal congestion: Secondary | ICD-10-CM

## 2022-10-25 DIAGNOSIS — R635 Abnormal weight gain: Secondary | ICD-10-CM | POA: Diagnosis not present

## 2022-10-25 DIAGNOSIS — E282 Polycystic ovarian syndrome: Secondary | ICD-10-CM

## 2022-10-25 MED ORDER — FLUTICASONE PROPIONATE 50 MCG/ACT NA SUSP
2.0000 | Freq: Every day | NASAL | 5 refills | Status: DC
Start: 2022-10-25 — End: 2023-03-22

## 2022-10-25 MED ORDER — TOPIRAMATE 25 MG PO TABS
50.0000 mg | ORAL_TABLET | Freq: Every day | ORAL | 2 refills | Status: DC
Start: 2022-10-25 — End: 2022-12-26

## 2022-10-25 NOTE — Assessment & Plan Note (Signed)
Improved with 4 pound weight loss since last visit.  -increase topamax to 50 mg every evening.  -Continue with low calorie, high-protein diet. Incorporate exercise into daily activities.   -consider increasing topamax to 50 mg twice daily if tolerating well and making progress with weight loss

## 2022-10-25 NOTE — Assessment & Plan Note (Signed)
Reviewed hormone panel  -elevated testosterone levels with other hormones normal.  -supports PCOS. Will continue to monitor

## 2022-10-25 NOTE — Assessment & Plan Note (Signed)
>>  ASSESSMENT AND PLAN FOR ABNORMAL WEIGHT GAIN WRITTEN ON 10/25/2022 10:05 AM BY BOSCIA, HEATHER E, NP  Improved with 4 pound weight loss since last visit.  -increase topamax to 50 mg every evening.  -Continue with low calorie, high-protein diet. Incorporate exercise into daily activities.   -consider increasing topamax to 50 mg twice daily if tolerating well and making progress with weight loss

## 2022-10-25 NOTE — Assessment & Plan Note (Signed)
Add flonase nasal spray.  -ues 1 to 2 sprays in both nostrils daily as needed.

## 2022-10-25 NOTE — Progress Notes (Signed)
Established patient visit   Patient: Tami Foster   DOB: 22-Feb-1981   42 y.o. Female  MRN: [id number] Visit Date: 10/25/2022   Chief Complaint  Patient presents with   Medical Management of Chronic Issues   Subjective    HPI  Follow up  Weight management -added topamax at most recent visit  -initial weight at start was 171 pounds  -today's weight 6/182024 - 167  -weight loss since last vist - 4 pounds  -no negative side effects  -hormone panel and cortisol levels checked at last visit -serum testosterone levels elevated which is consistent with PCOS  -cortisol and other hormone levels were normal.  Nasal congestion.  -states that clear mucus has been "pouring" out of her right nostril when turning her head upside down, ie. Putting towel on her head after shower, tying shoes, bending down to pick up her son.  -no headache, fever, chills, or other evidence of infection.  -does have patch placed in epidural region due to CSF leak after epidural during child birth. Concern that fluid may be leaking CSF.    Medications: Outpatient Medications Prior to Visit  Medication Sig   ALPRAZolam (XANAX) 1 MG tablet TAKE 1 TABLET BY MOUTH AT BEDTIME AS NEEDED FOR ANXIETY.   buPROPion (WELLBUTRIN XL) 300 MG 24 hr tablet Take 1 tablet (300 mg total) by mouth daily.   celecoxib (CELEBREX) 200 MG capsule Take 1 capsule (200 mg total) by mouth 2 (two) times daily as needed.   desvenlafaxine (PRISTIQ) 50 MG 24 hr tablet TAKE 1 TABLET BY MOUTH EVERY DAY   metoprolol succinate (TOPROL-XL) 50 MG 24 hr tablet Take 1 tablet (50 mg total) by mouth daily. Take with or immediately following a meal.   [DISCONTINUED] topiramate (TOPAMAX) 25 MG tablet Take 1 tablet (25 mg total) by mouth at bedtime.   No facility-administered medications prior to visit.    Review of Systems See HPI     Last CBC Lab Results  Component Value Date   WBC 9.0 02/08/2022   HGB 15.6 02/08/2022   HCT 44.9 02/08/2022    MCV 95 02/08/2022   MCH 32.9 02/08/2022   RDW 11.8 02/08/2022   PLT 208 02/08/2022   Last metabolic panel Lab Results  Component Value Date   GLUCOSE 100 (H) 02/08/2022   NA 139 02/08/2022   K 4.2 02/08/2022   CL 104 02/08/2022   CO2 19 (L) 02/08/2022   BUN 13 02/08/2022   CREATININE 0.60 02/08/2022   EGFR 117 02/08/2022   CALCIUM 9.1 02/08/2022   PROT 6.8 02/08/2022   ALBUMIN 4.5 02/08/2022   LABGLOB 2.3 02/08/2022   AGRATIO 2.0 02/08/2022   BILITOT 0.6 02/08/2022   ALKPHOS 94 02/08/2022   AST 15 02/08/2022   ALT 12 02/08/2022   ANIONGAP 8 05/04/2020   Last lipids Lab Results  Component Value Date   CHOL 208 (H) 09/30/2021   HDL 68 09/30/2021   LDLCALC 127 (H) 09/30/2021   TRIG 73 09/30/2021   CHOLHDL 3.1 09/30/2021   Last hemoglobin A1c Lab Results  Component Value Date   HGBA1C 5.4 02/08/2022   Last thyroid functions Lab Results  Component Value Date   TSH 0.729 02/08/2022   T3TOTAL 97.9 07/01/2013   Last vitamin D Lab Results  Component Value Date   VD25OH 31.1 02/08/2022        Objective     Today's Vitals   10/25/22 0913  BP: 111/76  Pulse: 97  SpO2: 98%  Weight: 167 lb 1.9 oz (75.8 kg)  Height: 5' 1.81" (1.57 m)   Body mass index is 30.75 kg/m.  BP Readings from Last 3 Encounters:  10/25/22 111/76  10/06/22 131/76  08/03/22 (Abnormal) 137/97    Wt Readings from Last 3 Encounters:  10/25/22 167 lb 1.9 oz (75.8 kg)  10/06/22 171 lb 1.9 oz (77.6 kg)  08/03/22 163 lb (73.9 kg)    Physical Exam Vitals and nursing note reviewed.  Constitutional:      Appearance: Normal appearance. She is well-developed.  HENT:     Head: Normocephalic and atraumatic.     Right Ear: Swelling present. Tympanic membrane is erythematous and bulging.     Left Ear: Hearing, tympanic membrane, ear canal and external ear normal.     Nose: Congestion present.     Right Turbinates: Enlarged and swollen.     Left Turbinates: Swollen.     Mouth/Throat:      Mouth: Mucous membranes are moist.     Pharynx: Oropharynx is clear.  Eyes:     Extraocular Movements: Extraocular movements intact.     Conjunctiva/sclera: Conjunctivae normal.     Pupils: Pupils are equal, round, and reactive to light.  Neck:     Vascular: No carotid bruit.  Cardiovascular:     Rate and Rhythm: Normal rate and regular rhythm.     Pulses: Normal pulses.     Heart sounds: Normal heart sounds.  Pulmonary:     Effort: Pulmonary effort is normal.     Breath sounds: Normal breath sounds.  Abdominal:     Palpations: Abdomen is soft.  Musculoskeletal:        General: Normal range of motion.     Cervical back: Normal range of motion and neck supple.  Skin:    General: Skin is warm and dry.     Capillary Refill: Capillary refill takes less than 2 seconds.  Neurological:     General: No focal deficit present.     Mental Status: She is alert and oriented to person, place, and time.  Psychiatric:        Mood and Affect: Mood normal.        Behavior: Behavior normal.        Thought Content: Thought content normal.        Judgment: Judgment normal.      Assessment & Plan    Nasal congestion Assessment & Plan: Add flonase nasal spray.  -ues 1 to 2 sprays in both nostrils daily as needed.   Orders: -     Fluticasone Propionate; Place 2 sprays into both nostrils daily.  Dispense: 16 g; Refill: 5  Abnormal weight gain Assessment & Plan: Improved with 4 pound weight loss since last visit.  -increase topamax to 50 mg every evening.  -Continue with low calorie, high-protein diet. Incorporate exercise into daily activities.   -consider increasing topamax to 50 mg twice daily if tolerating well and making progress with weight loss   Orders: -     Topiramate; Take 2 tablets (50 mg total) by mouth at bedtime.  Dispense: 60 tablet; Refill: 2  PCOS (polycystic ovarian syndrome) Assessment & Plan: Reviewed hormone panel  -elevated testosterone levels with other  hormones normal.  -supports PCOS. Will continue to monitor       Return in about 2 months (around 12/25/2022) for routine - weight management. increased topamax to 50 mg every evening .         Kathlynn Grate  Junius Creamer, NP  [facility] [phone] (phone) [phone] (fax)  [place]

## 2022-11-21 ENCOUNTER — Encounter: Payer: Self-pay | Admitting: Emergency Medicine

## 2022-11-21 ENCOUNTER — Other Ambulatory Visit: Payer: Self-pay

## 2022-11-21 ENCOUNTER — Emergency Department: Payer: Medicaid Other

## 2022-11-21 DIAGNOSIS — I10 Essential (primary) hypertension: Secondary | ICD-10-CM | POA: Diagnosis not present

## 2022-11-21 DIAGNOSIS — R110 Nausea: Secondary | ICD-10-CM | POA: Diagnosis not present

## 2022-11-21 DIAGNOSIS — R112 Nausea with vomiting, unspecified: Secondary | ICD-10-CM | POA: Insufficient documentation

## 2022-11-21 DIAGNOSIS — Z20822 Contact with and (suspected) exposure to covid-19: Secondary | ICD-10-CM | POA: Diagnosis not present

## 2022-11-21 DIAGNOSIS — R519 Headache, unspecified: Secondary | ICD-10-CM | POA: Diagnosis not present

## 2022-11-21 DIAGNOSIS — F419 Anxiety disorder, unspecified: Secondary | ICD-10-CM | POA: Diagnosis not present

## 2022-11-21 NOTE — ED Triage Notes (Signed)
Patient ambulatory to triage with steady gait, without difficulty or distress noted; pt reports for last week having intermittent HA's accomp by nausea, blurred vision and "fullness in ears"; denies c/o at present but was sent by UC for evaluation and CT; st pain occurs with "straining and positional changes"

## 2022-11-22 ENCOUNTER — Emergency Department
Admission: EM | Admit: 2022-11-22 | Discharge: 2022-11-22 | Disposition: A | Discharge status: Home / Self Care | Payer: Medicaid Other | Attending: Emergency Medicine | Admitting: Emergency Medicine

## 2022-11-22 DIAGNOSIS — R519 Headache, unspecified: Secondary | ICD-10-CM

## 2022-11-22 MED ORDER — BUTALBITAL-APAP-CAFFEINE 50-325-40 MG PO TABS: 1.0000 | ORAL_TABLET | Freq: Three times a day (TID) | ORAL | 0 refills | Status: DC | PRN

## 2022-11-22 MED ORDER — BUTALBITAL-APAP-CAFFEINE 50-325-40 MG PO TABS
2.0000 | ORAL_TABLET | ORAL | Status: AC
  Administered 2022-11-22: 2 via ORAL
  Filled 2022-11-22: qty 2

## 2022-11-22 NOTE — ED Provider Notes (Signed)
[facility] Provider Note    Event Date/Time   First MD Initiated Contact with Patient 11/22/22 0405     (approximate)   History   Headache   HPI Tami Foster is a 42 y.o. female who presents for evaluation of about 2 weeks of headache.  She has been to see her primary care doctor and an urgent care.  She had some concern because 2 years ago she needed a blood patch after an epidural and she did not know if she could be having complications as a result of that.  She has no neck pain or stiffness and no fever.  However she feels like her brain is "sloshing around".  Change in position such as standing up or bending over causes her head to hurt more and it is all throughout her head and feels like a pressure.  She also feels like there might be some pressure behind her eyes, and her sinuses, and in her ears.  She was told at her primary care provider that she had some fluid behind her ears.  She said that she feels pressure like when your ears are about to pop when she goes on an airplane.  No numbness or weakness.  Some nausea and vomiting over the last 2 weeks but not consistently.  No chest pain or shortness of breath.  No focal neurological deficits, difficulty with word finding, difficulty with ambulation, etc.     Physical Exam   Triage Vital Signs: ED Triage Vitals  Encounter Vitals Group     BP 11/21/22 2331 (!) 132/93     Systolic BP Percentile --      Diastolic BP Percentile --      Pulse Rate 11/21/22 2331 86     Resp 11/21/22 2331 20     Temp 11/21/22 2331 98.7 F (37.1 C)     Temp Source 11/21/22 2331 Oral     SpO2 11/21/22 2331 95 %     Weight 11/21/22 2332 76.2 kg (168 lb)     Height 11/21/22 2332 1.575 m (5\' 2" )     Head Circumference --      Peak Flow --      Pain Score 11/21/22 2332 0     Pain Loc --      Pain Education --      Exclude from Growth Chart --     Most recent vital signs: Vitals:   11/21/22 2331 11/22/22 0443   BP: (!) 132/93 (!) 128/98  Pulse: 86 64  Resp: 20 16  Temp: 98.7 F (37.1 C)   SpO2: 95% 99%    General: Awake, no obvious distress. Eyes:  Pupils are equal CV:  Good peripheral perfusion.  Resp:  Normal effort. Speaking easily and comfortably, no accessory muscle usage nor intercostal retractions.   Abd:  No distention.  Other:  Ambulatory without difficulty, no gross focal neurological deficits.   ED Results / Procedures / Treatments   Labs (all labs ordered are listed, but only abnormal results are displayed) Labs Reviewed - No data to display   RADIOLOGY I viewed and interpreted the patient's CT of the head.  No evidence of acute abnormality such as bleed or neoplasm.   PROCEDURES:  Critical Care performed: No  Procedures    IMPRESSION / MDM / ASSESSMENT AND PLAN / ED COURSE  I reviewed the triage vital signs and the nursing notes.  Differential diagnosis includes, but is not limited to, intracranial hemorrhage, meningitis/encephalitis, previous head trauma, cavernous venous thrombosis, tension headache, temporal arteritis, migraine or migraine equivalent, idiopathic intracranial hypertension, and non-specific headache.   Patient's presentation is most consistent with acute presentation with potential threat to life or bodily function.  Labs/studies ordered: Head CT  Interventions/Medications given:  Medications  butalbital-acetaminophen-caffeine (FIORICET) 50-325-40 MG per tablet 2 tablet (has no administration in time range)    (Note:  hospital course my include additional interventions and/or labs/studies not listed above.)   Stable vitals, reassuring physical exam, reassuring head CT.  Symptoms most consistent with sinus headache.  No evidence of systemic illness or concerning neurological findings.  Discussed extensively with the patient and she would like to try Fioricet and outpatient follow-up.  I think this is  reasonable.  No indication for lumbar puncture or additional evaluation such as lab work.  Patient is stable for discharge.         FINAL CLINICAL IMPRESSION(S) / ED DIAGNOSES   Final diagnoses:  Sinus headache     Rx / DC Orders   ED Discharge Orders          Ordered    butalbital-acetaminophen-caffeine (FIORICET) 50-325-40 MG tablet  Every 8 hours PRN        11/22/22 0500             Note:  This document was prepared using Dragon voice recognition software and may include unintentional dictation errors.   Loleta Rose, MD 11/22/22 [phone]

## 2022-11-22 NOTE — Discharge Instructions (Signed)

## 2022-12-12 ENCOUNTER — Encounter: Payer: Self-pay | Admitting: Family Medicine

## 2022-12-12 DIAGNOSIS — G43909 Migraine, unspecified, not intractable, without status migrainosus: Secondary | ICD-10-CM

## 2022-12-12 DIAGNOSIS — B9689 Other specified bacterial agents as the cause of diseases classified elsewhere: Secondary | ICD-10-CM

## 2022-12-12 MED ORDER — AMOXICILLIN-POT CLAVULANATE 875-125 MG PO TABS
1.0000 | ORAL_TABLET | Freq: Two times a day (BID) | ORAL | 0 refills | Status: AC
Start: 2022-12-12 — End: 2022-12-19

## 2022-12-12 MED ORDER — SUMATRIPTAN SUCCINATE 50 MG PO TABS
50.0000 mg | ORAL_TABLET | Freq: Once | ORAL | 0 refills | Status: DC
Start: 2022-12-12 — End: 2022-12-26

## 2022-12-12 NOTE — Addendum Note (Signed)
Addended by: Saralyn Pilar on: 12/12/2022 04:57 PM   Modules accepted: Orders

## 2022-12-19 ENCOUNTER — Other Ambulatory Visit: Payer: Self-pay | Admitting: Nurse Practitioner

## 2022-12-19 DIAGNOSIS — F411 Generalized anxiety disorder: Secondary | ICD-10-CM

## 2022-12-21 ENCOUNTER — Other Ambulatory Visit: Payer: Self-pay | Admitting: Family Medicine

## 2022-12-21 MED ORDER — FLUCONAZOLE 150 MG PO TABS: 150.0000 mg | ORAL_TABLET | Freq: Once | ORAL | 1 refills | Status: AC

## 2022-12-26 ENCOUNTER — Ambulatory Visit (INDEPENDENT_AMBULATORY_CARE_PROVIDER_SITE_OTHER): Payer: Medicaid Other | Admitting: Family Medicine

## 2022-12-26 ENCOUNTER — Encounter: Payer: Self-pay | Admitting: Family Medicine

## 2022-12-26 VITALS — BP 145/91 | HR 89 | Resp 18 | Ht 62.0 in | Wt 171.0 lb

## 2022-12-26 DIAGNOSIS — G43909 Migraine, unspecified, not intractable, without status migrainosus: Secondary | ICD-10-CM | POA: Diagnosis not present

## 2022-12-26 DIAGNOSIS — F411 Generalized anxiety disorder: Secondary | ICD-10-CM | POA: Diagnosis not present

## 2022-12-26 DIAGNOSIS — E66811 Obesity, class 1: Secondary | ICD-10-CM

## 2022-12-26 DIAGNOSIS — E282 Polycystic ovarian syndrome: Secondary | ICD-10-CM

## 2022-12-26 DIAGNOSIS — E669 Obesity, unspecified: Secondary | ICD-10-CM | POA: Diagnosis not present

## 2022-12-26 DIAGNOSIS — G4489 Other headache syndrome: Secondary | ICD-10-CM | POA: Diagnosis not present

## 2022-12-26 DIAGNOSIS — R5383 Other fatigue: Secondary | ICD-10-CM

## 2022-12-26 MED ORDER — BUPROPION HCL ER (XL) 300 MG PO TB24
300.0000 mg | ORAL_TABLET | Freq: Every day | ORAL | 2 refills | Status: DC
Start: 2022-12-26 — End: 2023-03-22

## 2022-12-26 MED ORDER — TOPIRAMATE 25 MG PO TABS: 50.0000 mg | ORAL_TABLET | Freq: Every day | ORAL | 2 refills | Status: DC

## 2022-12-26 MED ORDER — SUMATRIPTAN SUCCINATE 50 MG PO TABS
50.0000 mg | ORAL_TABLET | Freq: Once | ORAL | 0 refills | Status: DC
Start: 2022-12-26 — End: 2023-03-22

## 2022-12-26 MED ORDER — METFORMIN HCL 500 MG PO TABS
500.0000 mg | ORAL_TABLET | Freq: Every day | ORAL | 1 refills | Status: DC
Start: 2022-12-26 — End: 2023-06-22

## 2022-12-26 MED ORDER — DESVENLAFAXINE SUCCINATE ER 100 MG PO TB24
100.0000 mg | ORAL_TABLET | Freq: Every day | ORAL | 3 refills | Status: DC
Start: 2022-12-26 — End: 2023-03-22

## 2022-12-26 NOTE — Assessment & Plan Note (Signed)
PHQ-9 increased to 15 from 7.  Discussed increasing Pristiq, patient agreeable.  Start Pristiq 100 mg daily, continue Wellbutrin 300 mg daily and alprazolam as needed for breakthrough anxiety.  Will continue to monitor.

## 2022-12-26 NOTE — Progress Notes (Signed)
Established Patient Office Visit  Subjective   Patient ID: Tami Foster, female    DOB: 1980/07/28  Age: 42 y.o. MRN: [id number]  Chief Complaint  Patient presents with   Weight Management Screening   Anxiety    HPI Tami Foster is a 42 y.o. female presenting today for follow up of weight management, mood.  She is also concerned with ear fullness/nasal congestion and drainage.  CT was reassuring when she went to the emergency room and was diagnosed with sinus headache.  She did start antibiotics that I prescribed but developed a yeast infection.  She felt that this did improve the nasal congestion some.  She continues to have headaches off and on, and she at times experiences copious amounts of clear nasal drainage when bending over. Weight management: Weight has increased 4 lbs since last visit. They have been following a low calorie diet prioritizing protein.  She is currently taking topiramate 50 mg daily and Wellbutrin 300 mg daily.  Mood: Patient is here to follow up for anxiety and depression, currently managing with Pristiq and Wellbutrin. Taking medication without side effects, reports excellent compliance with treatment. Denies mood changes or SI/HI. She feels mood is worse since last visit.  She has noticed worsening anxiety, irritability, difficulty concentrating, decreased energy.     12/26/2022   11:10 AM 10/25/2022    9:17 AM 10/06/2022   10:15 AM  Depression screen PHQ 2/9  Decreased Interest 1 1 1   Down, Depressed, Hopeless 1 1 1   PHQ - 2 Score 2 2 2   Altered sleeping 1 1 1   Tired, decreased energy 3 2 3   Change in appetite 0 1 0  Feeling bad or failure about yourself  0 1 2  Trouble concentrating 1 1 2   Moving slowly or fidgety/restless 0 0 0  Suicidal thoughts 0 0 0  PHQ-9 Score 7 8 10   Difficult doing work/chores Somewhat difficult Somewhat difficult Not difficult at all       12/26/2022   11:10 AM 08/03/2022    9:29 AM 06/22/2022   10:13 AM 03/22/2022     9:52 AM  GAD 7 : Generalized Anxiety Score  Nervous, Anxious, on Edge 2 1 1 2   Control/stop worrying 2 1 1 2   Worry too much - different things 2 1 1 1   Trouble relaxing 2 1 1 1   Restless 2 1 1 1   Easily annoyed or irritable 3 1 1 1   Afraid - awful might happen 2 1 1 2   Total GAD 7 Score 15 7 7 10   Anxiety Difficulty Somewhat difficult      ROS Negative unless otherwise noted in HPI   Objective:     BP (!) 145/91 (BP Location: Left Arm, Patient Position: Sitting, Cuff Size: Normal)   Pulse 89   Resp 18   Ht 5\' 2"  (1.575 m)   Wt 171 lb (77.6 kg)   SpO2 98%   BMI 31.28 kg/m   Physical Exam Constitutional:      General: She is not in acute distress.    Appearance: Normal appearance. She is not ill-appearing.  HENT:     Head: Normocephalic and atraumatic.     Nose: Nose normal. No congestion or rhinorrhea.     Mouth/Throat:     Mouth: Mucous membranes are moist.     Pharynx: Oropharynx is clear. No oropharyngeal exudate or posterior oropharyngeal erythema.  Eyes:     General:  Right eye: No discharge.        Left eye: No discharge.     Conjunctiva/sclera: Conjunctivae normal.     Pupils: Pupils are equal, round, and reactive to light.  Cardiovascular:     Rate and Rhythm: Normal rate and regular rhythm.     Heart sounds: No murmur heard.    No friction rub. No gallop.  Pulmonary:     Effort: Pulmonary effort is normal. No respiratory distress.     Breath sounds: Normal breath sounds. No wheezing, rhonchi or rales.  Skin:    General: Skin is warm and dry.  Neurological:     Mental Status: She is alert and oriented to person, place, and time.      Assessment & Plan:  Obesity, Class I, BMI 30-34.9 Assessment & Plan: Patient tolerating increased dose of topiramate 50 mg nightly well but is frustrated with lack of progress on the scale.  We discussed that PCOS is likely also contributing to difficulties with weight loss.  Patient is agreeable to adding  metformin for improved blood sugar control.  Start metformin 500 mg once daily with largest meal, can increase at next appointment if tolerating well.  Continue with low calorie, high-protein diet.  At next appointment, we will also further discuss specific fluid, protein, fiber goals.  Orders: -     metFORMIN HCl; Take 1 tablet (500 mg total) by mouth daily. Take with largest meal of the day.  Dispense: 90 tablet; Refill: 1 -     Topiramate; Take 2 tablets (50 mg total) by mouth at bedtime.  Dispense: 60 tablet; Refill: 2  PCOS (polycystic ovarian syndrome) -     metFORMIN HCl; Take 1 tablet (500 mg total) by mouth daily. Take with largest meal of the day.  Dispense: 90 tablet; Refill: 1  Generalized anxiety disorder Assessment & Plan: PHQ-9 increased to 15 from 7.  Discussed increasing Pristiq, patient agreeable.  Start Pristiq 100 mg daily, continue Wellbutrin 300 mg daily and alprazolam as needed for breakthrough anxiety.  Will continue to monitor.  Orders: -     Desvenlafaxine Succinate ER; Take 1 tablet (100 mg total) by mouth daily.  Dispense: 90 tablet; Refill: 3 -     buPROPion HCl ER (XL); Take 1 tablet (300 mg total) by mouth daily.  Dispense: 30 tablet; Refill: 2  Other fatigue -     buPROPion HCl ER (XL); Take 1 tablet (300 mg total) by mouth daily.  Dispense: 30 tablet; Refill: 2  Acute migraine -     SUMAtriptan Succinate; Take 1 tablet (50 mg total) by mouth once for 1 dose. May repeat in 2 hours if headache persists or recurs.  Dispense: 1 tablet; Refill: 0  Other headache syndrome -     Ambulatory referral to Neurology  Starting with referral to neurology for further workup of ongoing headache.  Patient would prefer some reassurance with neurology evaluation given history of dural puncture during epidural in 2022.  In the meantime, recommended trial of daily allergy medication to relieve sinus pressure, ear fullness, nasal drainage.  Patient verbalized understanding and is  agreeable to this plan.  Return in about 2 months (around 02/25/2023) for follow-up for anxiety and weight management.    Melida Quitter, PA

## 2022-12-26 NOTE — Patient Instructions (Signed)
Neurology should be calling in the next few weeks to get you scheduled. In the meantime, try a daily allergy medicine to see if it helps with the nasal drainage and headaches!

## 2022-12-26 NOTE — Assessment & Plan Note (Signed)
Patient tolerating increased dose of topiramate 50 mg nightly well but is frustrated with lack of progress on the scale.  We discussed that PCOS is likely also contributing to difficulties with weight loss.  Patient is agreeable to adding metformin for improved blood sugar control.  Start metformin 500 mg once daily with largest meal, can increase at next appointment if tolerating well.  Continue with low calorie, high-protein diet.  At next appointment, we will also further discuss specific fluid, protein, fiber goals.

## 2023-01-17 ENCOUNTER — Other Ambulatory Visit: Payer: Self-pay | Admitting: Family Medicine

## 2023-01-17 ENCOUNTER — Encounter: Payer: Self-pay | Admitting: Family Medicine

## 2023-01-17 ENCOUNTER — Other Ambulatory Visit: Payer: Self-pay | Admitting: Nurse Practitioner

## 2023-01-17 DIAGNOSIS — R002 Palpitations: Secondary | ICD-10-CM

## 2023-01-17 DIAGNOSIS — R000 Tachycardia, unspecified: Secondary | ICD-10-CM

## 2023-01-17 MED ORDER — METOPROLOL SUCCINATE ER 50 MG PO TB24
50.0000 mg | ORAL_TABLET | Freq: Every day | ORAL | 3 refills | Status: DC
Start: 2023-01-17 — End: 2024-03-29

## 2023-01-19 ENCOUNTER — Ambulatory Visit: Payer: Medicaid Other | Admitting: Family Medicine

## 2023-02-27 ENCOUNTER — Encounter: Payer: Self-pay | Admitting: Family Medicine

## 2023-02-27 ENCOUNTER — Telehealth (INDEPENDENT_AMBULATORY_CARE_PROVIDER_SITE_OTHER): Payer: Medicaid Other | Admitting: Family Medicine

## 2023-02-27 VITALS — Ht 62.0 in | Wt 171.0 lb

## 2023-02-27 DIAGNOSIS — E66811 Obesity, class 1: Secondary | ICD-10-CM

## 2023-02-27 DIAGNOSIS — F5104 Psychophysiologic insomnia: Secondary | ICD-10-CM

## 2023-02-27 DIAGNOSIS — F321 Major depressive disorder, single episode, moderate: Secondary | ICD-10-CM | POA: Diagnosis not present

## 2023-02-27 DIAGNOSIS — F411 Generalized anxiety disorder: Secondary | ICD-10-CM

## 2023-02-27 MED ORDER — QUETIAPINE FUMARATE 50 MG PO TABS: 50.0000 mg | ORAL_TABLET | Freq: Every day | ORAL | 2 refills | Status: DC

## 2023-02-27 NOTE — Assessment & Plan Note (Signed)
Topiramate has not been effective, patient is agreeable to tapering off of medication.  MyChart message sent with instructions to taper off of topiramate: 25 mg for 10 days, then 12.5 mg daily for 10 days, then discontinue.  Agreeable to increasing metformin to 1000 mg (2 tablets) daily with dinner.  We discussed that this will be particularly helpful for the insulin resistance that is a part of PCOS.  Continue low calorie, high-protein diet.  Hopeful that improvement in mood will also be helpful for weight management goals.

## 2023-02-27 NOTE — Progress Notes (Signed)
Virtual Visit via Video Note  I connected with Tami Foster on 02/27/23 at 10:50 AM EDT by a video enabled telemedicine application and verified that I am speaking with the correct person using two identifiers.  Patient Location: Home Provider Location: Office/clinic  I discussed the limitations, risks, security, and privacy concerns of performing an evaluation and management service by video and the availability of in person appointments. I also discussed with the patient that there may be a patient responsible charge related to this service. The patient expressed understanding and agreed to proceed.    Subjective   Patient ID: Tami Foster, female    DOB: July 12, 1980  Age: 42 y.o. MRN: [id number]  Chief Complaint  Patient presents with   Anxiety    HPI VERONCIA Foster is a 42 y.o. female presenting today for follow up of mood, weight management Weight management: Weight has remained stable since last visit. They have been following a low calorie diet prioritizing protein.  She is currently taking topiramate 50 mg daily, Wellbutrin 300 mg daily, and metformin 500 mg every evening which was started at her last appointment.  She has not noticed any changes in her weight when measuring at home.  Denies side effects from metformin.  She does say that she has not felt that topiramate has made any difference since starting it. Mood: Patient is here to follow up for depression and anxiety, currently managing with desvenlafaxine 100 mg daily which was increased at her last appointment, Wellbutrin 300 mg daily. Taking medication without side effects, reports excellent compliance with treatment. Denies mood changes or Foster/HI. She feels mood is  about the same  since last visit.  She has.  Where she feels really up or really down, noting that she has more periods of feeling down.  Every once in a while, she will have a day or 2 of having high energy and high motivation.  She describes it as  "almost manic".     02/27/2023   10:42 AM 12/26/2022   11:10 AM 10/25/2022    9:17 AM  Depression screen PHQ 2/9  Decreased Interest 1 1 1   Down, Depressed, Hopeless 1 1 1   PHQ - 2 Score 2 2 2   Altered sleeping 1 1 1   Tired, decreased energy 1 3 2   Change in appetite 0 0 1  Feeling bad or failure about yourself  0 0 1  Trouble concentrating 1 1 1   Moving slowly or fidgety/restless 0 0 0  Suicidal thoughts 0 0 0  PHQ-9 Score 5 7 8   Difficult doing work/chores Somewhat difficult Somewhat difficult Somewhat difficult       02/27/2023   10:44 AM 12/26/2022   11:10 AM 08/03/2022    9:29 AM 06/22/2022   10:13 AM  GAD 7 : Generalized Anxiety Score  Nervous, Anxious, on Edge 3 2 1 1   Control/stop worrying 1 2 1 1   Worry too much - different things 3 2 1 1   Trouble relaxing 1 2 1 1   Restless 1 2 1 1   Easily annoyed or irritable 1 3 1 1   Afraid - awful might happen 3 2 1 1   Total GAD 7 Score 13 15 7 7   Anxiety Difficulty Somewhat difficult Somewhat difficult      ROS Negative unless otherwise noted in HPI  Outpatient Medications Prior to Visit  Medication Sig   ALPRAZolam (XANAX) 1 MG tablet TAKE 1 TABLET BY MOUTH AT BEDTIME AS NEEDED FOR ANXIETY  buPROPion (WELLBUTRIN XL) 300 MG 24 hr tablet Take 1 tablet (300 mg total) by mouth daily.   butalbital-acetaminophen-caffeine (FIORICET) 50-325-40 MG tablet Take 1-2 tablets by mouth every 8 (eight) hours as needed for headache.   celecoxib (CELEBREX) 200 MG capsule Take 1 capsule (200 mg total) by mouth 2 (two) times daily as needed.   desvenlafaxine (PRISTIQ) 100 MG 24 hr tablet Take 1 tablet (100 mg total) by mouth daily.   fluticasone (FLONASE) 50 MCG/ACT nasal spray Place 2 sprays into both nostrils daily.   metFORMIN (GLUCOPHAGE) 500 MG tablet Take 1 tablet (500 mg total) by mouth daily. Take with largest meal of the day.   metoprolol succinate (TOPROL-XL) 50 MG 24 hr tablet Take 1 tablet (50 mg total) by mouth daily. Take with  or immediately following a meal.   topiramate (TOPAMAX) 25 MG tablet Take 2 tablets (50 mg total) by mouth at bedtime.   SUMAtriptan (IMITREX) 50 MG tablet Take 1 tablet (50 mg total) by mouth once for 1 dose. May repeat in 2 hours if headache persists or recurs.   No facility-administered medications prior to visit.     Objective:    Ht 5\' 2"  (1.575 m)   Wt 171 lb (77.6 kg)   BMI 31.28 kg/m   Physical Exam General: Speaking clearly in complete sentences without any shortness of breath.  Alert and oriented x3.  Normal judgment. No apparent acute distress.   Assessment & Plan:  Obesity, Class I, BMI 30-34.9 Assessment & Plan: Topiramate has not been effective, patient is agreeable to tapering off of medication.  MyChart message sent with instructions to taper off of topiramate: 25 mg for 10 days, then 12.5 mg daily for 10 days, then discontinue.  Agreeable to increasing metformin to 1000 mg (2 tablets) daily with dinner.  We discussed that this will be particularly helpful for the insulin resistance that is a part of PCOS.  Continue low calorie, high-protein diet.  Hopeful that improvement in mood will also be helpful for weight management goals.   Generalized anxiety disorder Assessment & Plan: PHQ-9 score 5, GAD-7 score 13.  Minimal improvement with increase of Pristiq.  Given periods of ups and downs in addition to trouble with racing thoughts making it difficult to fall asleep, discussed starting quetiapine to help regulate serotonin levels and improve sleep.  Patient is agreeable to quetiapine for 1 month, at follow-up appointment can discuss potentially decreasing or discontinuing Pristiq versus Wellbutrin.  Will continue to monitor.  Orders: -     QUEtiapine Fumarate; Take 1 tablet (50 mg total) by mouth at bedtime.  Dispense: 30 tablet; Refill: 2  Current moderate episode of major depressive disorder without prior episode (HCC) -     QUEtiapine Fumarate; Take 1 tablet (50 mg  total) by mouth at bedtime.  Dispense: 30 tablet; Refill: 2  Psychophysiologic insomnia -     QUEtiapine Fumarate; Take 1 tablet (50 mg total) by mouth at bedtime.  Dispense: 30 tablet; Refill: 2    Return in about 4 weeks (around 03/27/2023) for follow-up for mood and sleep, starting Seroquel.   I discussed the assessment and treatment plan with the patient. The patient was provided an opportunity to ask questions, and all were answered. The patient agreed with the plan and demonstrated an understanding of the instructions.   The patient was advised to call back or seek an in-person evaluation if the symptoms worsen or if the condition fails to improve as anticipated.  The above assessment and management plan was discussed with the patient. The patient verbalized understanding of and has agreed to the management plan.   Melida Quitter, PA

## 2023-02-27 NOTE — Assessment & Plan Note (Signed)
PHQ-9 score 5, GAD-7 score 13.  Minimal improvement with increase of Pristiq.  Given periods of ups and downs in addition to trouble with racing thoughts making it difficult to fall asleep, discussed starting quetiapine to help regulate serotonin levels and improve sleep.  Patient is agreeable to quetiapine for 1 month, at follow-up appointment can discuss potentially decreasing or discontinuing Pristiq versus Wellbutrin.  Will continue to monitor.

## 2023-03-22 ENCOUNTER — Ambulatory Visit (INDEPENDENT_AMBULATORY_CARE_PROVIDER_SITE_OTHER): Payer: Medicaid Other | Admitting: Family Medicine

## 2023-03-22 ENCOUNTER — Encounter: Payer: Self-pay | Admitting: Family Medicine

## 2023-03-22 ENCOUNTER — Other Ambulatory Visit: Payer: Self-pay | Admitting: Family Medicine

## 2023-03-22 VITALS — BP 119/85 | HR 85 | Resp 18 | Ht 62.0 in | Wt 171.0 lb

## 2023-03-22 DIAGNOSIS — F321 Major depressive disorder, single episode, moderate: Secondary | ICD-10-CM

## 2023-03-22 DIAGNOSIS — F5104 Psychophysiologic insomnia: Secondary | ICD-10-CM | POA: Diagnosis not present

## 2023-03-22 DIAGNOSIS — F411 Generalized anxiety disorder: Secondary | ICD-10-CM | POA: Diagnosis not present

## 2023-03-22 NOTE — Assessment & Plan Note (Signed)
PHQ-9 score 4, GAD-7 score 4.  Continue quetiapine 50 mg daily.  Will continue to monitor.

## 2023-03-22 NOTE — Assessment & Plan Note (Signed)
Sleep has significantly improved.  Continue quetiapine 50 mg daily at bedtime.  Will continue to monitor.

## 2023-03-22 NOTE — Progress Notes (Signed)
Established Patient Office Visit  Subjective   Patient ID: Tami Foster, female    DOB: 1981/03/19  Age: 42 y.o. MRN: [id number]  Chief Complaint  Patient presents with   Anxiety   Depression   Insomnia    HPI Tami Foster is a 42 y.o. female presenting today for follow up of sleep, mood. Insomnia: She struggles with sleep induction due to racing thoughts. She is taking quetiapine nightly, denies side effects.  Sleep has improved significantly, she is able to fall asleep immediately. Mood: Patient is here to follow up for depression and anxiety, currently prescribed Pristiq, Wellbutrin, and quetiapine which was added at last appointment.  She has found that quetiapine has been so helpful for sleep and mood that she tapered herself off of Pristiq and Wellbutrin.  She continues to take quetiapine at bedtime.  Taking medication without side effects, reports excellent compliance with treatment. Denies mood changes or SI/HI. She feels mood is improved significantly since last visit.     03/22/2023    4:08 PM 02/27/2023   10:42 AM 12/26/2022   11:10 AM  Depression screen PHQ 2/9  Decreased Interest 1 1 1   Down, Depressed, Hopeless 1 1 1   PHQ - 2 Score 2 2 2   Altered sleeping 0 1 1  Tired, decreased energy 0 1 3  Change in appetite 1 0 0  Feeling bad or failure about yourself  0 0 0  Trouble concentrating 1 1 1   Moving slowly or fidgety/restless 0 0 0  Suicidal thoughts 0 0 0  PHQ-9 Score 4 5 7   Difficult doing work/chores Somewhat difficult Somewhat difficult Somewhat difficult       03/22/2023    4:08 PM 02/27/2023   10:44 AM 12/26/2022   11:10 AM 08/03/2022    9:29 AM  GAD 7 : Generalized Anxiety Score  Nervous, Anxious, on Edge 1 3 2 1   Control/stop worrying 1 1 2 1   Worry too much - different things 1 3 2 1   Trouble relaxing 0 1 2 1   Restless 0 1 2 1   Easily annoyed or irritable 0 1 3 1   Afraid - awful might happen 1 3 2 1   Total GAD 7 Score 4 13 15 7    Anxiety Difficulty Somewhat difficult Somewhat difficult Somewhat difficult     Outpatient Medications Prior to Visit  Medication Sig   ALPRAZolam (XANAX) 1 MG tablet TAKE 1 TABLET BY MOUTH AT BEDTIME AS NEEDED FOR ANXIETY   metFORMIN (GLUCOPHAGE) 500 MG tablet Take 1 tablet (500 mg total) by mouth daily. Take with largest meal of the day.   metoprolol succinate (TOPROL-XL) 50 MG 24 hr tablet Take 1 tablet (50 mg total) by mouth daily. Take with or immediately following a meal.   QUEtiapine (SEROQUEL) 50 MG tablet Take 1 tablet (50 mg total) by mouth at bedtime.   [DISCONTINUED] buPROPion (WELLBUTRIN XL) 300 MG 24 hr tablet Take 1 tablet (300 mg total) by mouth daily.   [DISCONTINUED] butalbital-acetaminophen-caffeine (FIORICET) 50-325-40 MG tablet Take 1-2 tablets by mouth every 8 (eight) hours as needed for headache.   [DISCONTINUED] celecoxib (CELEBREX) 200 MG capsule Take 1 capsule (200 mg total) by mouth 2 (two) times daily as needed.   [DISCONTINUED] desvenlafaxine (PRISTIQ) 100 MG 24 hr tablet Take 1 tablet (100 mg total) by mouth daily.   [DISCONTINUED] fluticasone (FLONASE) 50 MCG/ACT nasal spray Place 2 sprays into both nostrils daily.   [DISCONTINUED] topiramate (TOPAMAX) 25 MG tablet Take  2 tablets (50 mg total) by mouth at bedtime.   [DISCONTINUED] SUMAtriptan (IMITREX) 50 MG tablet Take 1 tablet (50 mg total) by mouth once for 1 dose. May repeat in 2 hours if headache persists or recurs.   No facility-administered medications prior to visit.    ROS Negative unless otherwise noted in HPI   Objective:     BP 119/85 (BP Location: Left Arm, Patient Position: Sitting, Cuff Size: Normal)   Pulse 85   Resp 18   Ht 5\' 2"  (1.575 m)   Wt 171 lb (77.6 kg)   SpO2 95%   BMI 31.28 kg/m   Physical Exam Constitutional:      General: She is not in acute distress.    Appearance: Normal appearance.  HENT:     Head: Normocephalic and atraumatic.  Cardiovascular:     Rate and  Rhythm: Normal rate and regular rhythm.     Heart sounds: No murmur heard.    No friction rub. No gallop.  Pulmonary:     Effort: Pulmonary effort is normal. No respiratory distress.     Breath sounds: No wheezing, rhonchi or rales.  Skin:    General: Skin is warm and dry.  Neurological:     Mental Status: She is alert and oriented to person, place, and time.     Assessment & Plan:  Generalized anxiety disorder Assessment & Plan: PHQ-9 score 4, GAD-7 score 4.  Continue quetiapine 50 mg daily.  Will continue to monitor.   Current moderate episode of major depressive disorder without prior episode (HCC) Assessment & Plan: PHQ-9 score 4, GAD-7 score 4.  Continue quetiapine 50 mg daily.  Will continue to monitor.     Psychophysiologic insomnia Assessment & Plan: Sleep has significantly improved.  Continue quetiapine 50 mg daily at bedtime.  Will continue to monitor.   Given efficacy of quetiapine and description at previous visit of "almost manic" episodes that do sound similar to hypomania, some symptoms may overlap with bipolar 1.  Return in about 3 months (around 06/22/2023).    Melida Quitter, PA

## 2023-03-23 MED ORDER — WEGOVY 0.25 MG/0.5ML ~~LOC~~ SOAJ: 0.2500 mg | SUBCUTANEOUS | 0 refills | Status: DC

## 2023-03-23 NOTE — Addendum Note (Signed)
Addended by: Saralyn Pilar on: 03/23/2023 02:06 PM   Modules accepted: Orders

## 2023-03-26 ENCOUNTER — Other Ambulatory Visit: Payer: Self-pay | Admitting: Family Medicine

## 2023-03-26 DIAGNOSIS — F411 Generalized anxiety disorder: Secondary | ICD-10-CM

## 2023-04-10 DIAGNOSIS — U071 COVID-19: Secondary | ICD-10-CM | POA: Diagnosis not present

## 2023-04-28 ENCOUNTER — Other Ambulatory Visit: Payer: Self-pay | Admitting: Family Medicine

## 2023-04-28 DIAGNOSIS — F411 Generalized anxiety disorder: Secondary | ICD-10-CM

## 2023-05-15 ENCOUNTER — Other Ambulatory Visit: Payer: Self-pay | Admitting: Family Medicine

## 2023-05-15 DIAGNOSIS — E66811 Obesity, class 1: Secondary | ICD-10-CM

## 2023-05-15 MED ORDER — WEGOVY 0.25 MG/0.5ML ~~LOC~~ SOAJ: 0.2500 mg | SUBCUTANEOUS | 0 refills | Status: DC

## 2023-05-16 ENCOUNTER — Encounter: Payer: Self-pay | Admitting: Family Medicine

## 2023-05-16 DIAGNOSIS — E66811 Obesity, class 1: Secondary | ICD-10-CM

## 2023-05-16 MED ORDER — WEGOVY 0.5 MG/0.5ML ~~LOC~~ SOAJ: 0.5000 mg | SUBCUTANEOUS | 1 refills | Status: DC

## 2023-05-29 ENCOUNTER — Other Ambulatory Visit: Payer: Self-pay | Admitting: Family Medicine

## 2023-05-29 DIAGNOSIS — F411 Generalized anxiety disorder: Secondary | ICD-10-CM

## 2023-06-15 ENCOUNTER — Encounter: Payer: Self-pay | Admitting: Family Medicine

## 2023-06-15 MED ORDER — WEGOVY 1 MG/0.5ML ~~LOC~~ SOAJ: 1.0000 mg | SUBCUTANEOUS | 1 refills | Status: DC

## 2023-06-15 NOTE — Addendum Note (Signed)
 Addended by: Maryclare Smoke on: 06/15/2023 04:21 PM   Modules accepted: Orders

## 2023-06-22 ENCOUNTER — Ambulatory Visit (INDEPENDENT_AMBULATORY_CARE_PROVIDER_SITE_OTHER): Payer: Medicaid Other | Admitting: Family Medicine

## 2023-06-22 ENCOUNTER — Encounter: Payer: Self-pay | Admitting: Family Medicine

## 2023-06-22 VITALS — BP 110/78 | HR 94 | Ht 62.0 in | Wt 169.4 lb

## 2023-06-22 DIAGNOSIS — E66811 Obesity, class 1: Secondary | ICD-10-CM | POA: Diagnosis not present

## 2023-06-22 DIAGNOSIS — F321 Major depressive disorder, single episode, moderate: Secondary | ICD-10-CM

## 2023-06-22 DIAGNOSIS — R5383 Other fatigue: Secondary | ICD-10-CM | POA: Diagnosis not present

## 2023-06-22 DIAGNOSIS — F411 Generalized anxiety disorder: Secondary | ICD-10-CM | POA: Diagnosis not present

## 2023-06-22 DIAGNOSIS — F5104 Psychophysiologic insomnia: Secondary | ICD-10-CM

## 2023-06-22 DIAGNOSIS — B351 Tinea unguium: Secondary | ICD-10-CM | POA: Diagnosis not present

## 2023-06-22 DIAGNOSIS — L600 Ingrowing nail: Secondary | ICD-10-CM

## 2023-06-22 MED ORDER — ALPRAZOLAM 1 MG PO TABS: 0.5000 mg | ORAL_TABLET | ORAL | 0 refills | Status: DC | PRN

## 2023-06-22 MED ORDER — TERBINAFINE HCL 250 MG PO TABS: 250.0000 mg | ORAL_TABLET | Freq: Every day | ORAL | 0 refills | Status: AC

## 2023-06-22 MED ORDER — WEGOVY 1.7 MG/0.75ML ~~LOC~~ SOAJ: 1.7000 mg | SUBCUTANEOUS | 2 refills | Status: DC

## 2023-06-22 NOTE — Assessment & Plan Note (Signed)
Continue quetiapine 50 mg daily at bedtime.  Patient has struggled with sleep since adolescence, endorses struggling with sleep induction and sleep maintenance.  This causes fatigue during the day as well.  She has never had a sleep study done, ordering home sleep study.  Will continue to monitor.

## 2023-06-22 NOTE — Assessment & Plan Note (Signed)
For now, continue quetiapine 50 mg daily.  Discussed that it would be reasonable to restart Pristiq and/or Wellbutrin for mood, irritability, and smoking cessation.  Patient would like to wait for right now.  Will continue to monitor.

## 2023-06-22 NOTE — Assessment & Plan Note (Signed)
Tolerating Wegovy well.  Increase to Wegovy 1.7 mg next month.  Continue low calorie, high-protein diet.  Will continue to monitor.

## 2023-06-22 NOTE — Assessment & Plan Note (Signed)
Onychomycosis has been refractory to over-the-counter remedies.  Initiate terbinafine 250 mg daily.  Discussed that this can take several months to alleviate the issue, patient verbalized understanding.  Also referring to podiatry for management of beginnings of ingrown great toenails bilaterally.  Recheck liver function at follow-up.

## 2023-06-22 NOTE — Progress Notes (Signed)
Established Patient Office Visit  Subjective   Patient ID: Tami Foster, female    DOB: [birth year]  Age: 43 y.o. MRN: [id number]  Chief Complaint  Patient presents with   Medical Management of Chronic Issues    HPI Tami Foster is a 42 y.o. female presenting today for follow up of mood.  She also notes that over the past few months her feet have been very itchy and dry.  Some of her toenails have also turned white.  This has not occurred since her pregnancy.  She has tried over-the-counter medicines without improvement. Mood: Patient is here to follow up for depression and anxiety, currently managing with quetiapine for depression/anxiety/insomnia. Taking medication without side effects, reports excellent compliance with treatment. Denies mood changes or SI/HI. She feels mood is worse since last visit.  She feels that the quetiapine is not working as well for sleep as it initially did, and she has been much more irritable.  She also notes that she is currently working on smoking cessation which may also be contributing to her symptoms.     06/22/2023   11:02 AM 03/22/2023    4:08 PM 02/27/2023   10:42 AM  Depression screen PHQ 2/9  Decreased Interest 1 1 1   Down, Depressed, Hopeless 1 1 1   PHQ - 2 Score 2 2 2   Altered sleeping 2 0 1  Tired, decreased energy 2 0 1  Change in appetite 0 1 0  Feeling bad or failure about yourself  0 0 0  Trouble concentrating 1 1 1   Moving slowly or fidgety/restless 0 0 0  Suicidal thoughts 0 0 0  PHQ-9 Score 7 4 5   Difficult doing work/chores Somewhat difficult Somewhat difficult Somewhat difficult       03/22/2023    4:08 PM 02/27/2023   10:44 AM 12/26/2022   11:10 AM 08/03/2022    9:29 AM  GAD 7 : Generalized Anxiety Score  Nervous, Anxious, on Edge 1 3 2 1   Control/stop worrying 1 1 2 1   Worry too much - different things 1 3 2 1   Trouble relaxing 0 1 2 1   Restless 0 1 2 1   Easily annoyed or irritable 0 1 3 1   Afraid - awful  might happen 1 3 2 1   Total GAD 7 Score 4 13 15 7   Anxiety Difficulty Somewhat difficult Somewhat difficult Somewhat difficult      Outpatient Medications Prior to Visit  Medication Sig   metoprolol succinate (TOPROL-XL) 50 MG 24 hr tablet Take 1 tablet (50 mg total) by mouth daily. Take with or immediately following a meal.   QUEtiapine (SEROQUEL) 50 MG tablet Take 1 tablet (50 mg total) by mouth at bedtime.   [DISCONTINUED] ALPRAZolam (XANAX) 1 MG tablet TAKE 0.5-1 TABLET BY MOUTH DAILY AS NEEDED FOR ANXIETY (FOR BREAKTHROUGH SEVERE ANXIETY).   [DISCONTINUED] metFORMIN (GLUCOPHAGE) 500 MG tablet Take 1 tablet (500 mg total) by mouth daily. Take with largest meal of the day.   [DISCONTINUED] Semaglutide-Weight Management (WEGOVY) 1 MG/0.5ML SOAJ Inject 1 mg into the skin once a week.   No facility-administered medications prior to visit.    ROS Negative unless otherwise noted in HPI   Objective:     BP 110/78   Pulse 94   Ht 5\' 2"  (1.575 m)   Wt 169 lb 6.4 oz (76.8 kg)   SpO2 99%   BMI 30.98 kg/m   Physical Exam Constitutional:      General: She  is not in acute distress.    Appearance: Normal appearance.  HENT:     Head: Normocephalic and atraumatic.  Pulmonary:     Effort: Pulmonary effort is normal. No respiratory distress.  Musculoskeletal:     Cervical back: Normal range of motion.  Skin:    Comments: Thickened white toenails bilaterally.  Great toenails curving inward and starting to irritate skin/be tender to palpation.  Red, dry skin in between toes.  Neurological:     General: No focal deficit present.     Mental Status: She is alert and oriented to person, place, and time. Mental status is at baseline.  Psychiatric:        Mood and Affect: Mood normal.        Thought Content: Thought content normal.        Judgment: Judgment normal.      Assessment & Plan:  Psychophysiologic insomnia Assessment & Plan: Continue quetiapine 50 mg daily at bedtime.   Patient has struggled with sleep since adolescence, endorses struggling with sleep induction and sleep maintenance.  This causes fatigue during the day as well.  She has never had a sleep study done, ordering home sleep study.  Will continue to monitor.   Generalized anxiety disorder Assessment & Plan: For now, continue quetiapine 50 mg daily.  Discussed that it is reasonable to restart Pristiq if needed for better control of anxiety and depression.  Patient would like to wait at the moment.  Orders: -     ALPRAZolam; Take 0.5-1 tablets (0.5-1 mg total) by mouth as needed for anxiety (SEVERE BREAKTHROUGH ANXIETY).  Dispense: 15 tablet; Refill: 0  Onychomycosis of great toe Assessment & Plan: Onychomycosis has been refractory to over-the-counter remedies.  Initiate terbinafine 250 mg daily.  Discussed that this can take several months to alleviate the issue, patient verbalized understanding.  Also referring to podiatry for management of beginnings of ingrown great toenails bilaterally.  Recheck liver function at follow-up.  Orders: -     Terbinafine HCl; Take 1 tablet (250 mg total) by mouth daily.  Dispense: 72 tablet; Refill: 0 -     Ambulatory referral to Podiatry  Ingrown toenail of both feet -     Ambulatory referral to Podiatry  Obesity, Class I, BMI 30-34.9 Assessment & Plan: Tolerating Wegovy well.  Increase to Wegovy 1.7 mg next month.  Continue low calorie, high-protein diet.  Will continue to monitor.  Orders: -     Wegovy; Inject 1.7 mg into the skin once a week.  Dispense: 3 mL; Refill: 2 -     Home sleep test; Future  Fatigue, unspecified type -     Home sleep test; Future  Current moderate episode of major depressive disorder without prior episode [facility]) Assessment & Plan: For now, continue quetiapine 50 mg daily.  Discussed that it would be reasonable to restart Pristiq and/or Wellbutrin for mood, irritability, and smoking cessation.  Patient would like to wait for  right now.  Will continue to monitor.       Return in about 3 months (around 09/19/2023) for follow-up for mood, sleep, onychomycosis, sleep study.    Melida Quitter, PA

## 2023-06-22 NOTE — Assessment & Plan Note (Signed)
For now, continue quetiapine 50 mg daily.  Discussed that it is reasonable to restart Pristiq if needed for better control of anxiety and depression.  Patient would like to wait at the moment.

## 2023-06-29 ENCOUNTER — Ambulatory Visit: Payer: Medicaid Other | Admitting: Podiatry

## 2023-07-05 ENCOUNTER — Ambulatory Visit (INDEPENDENT_AMBULATORY_CARE_PROVIDER_SITE_OTHER): Payer: Medicaid Other | Admitting: Podiatry

## 2023-07-05 ENCOUNTER — Encounter: Payer: Self-pay | Admitting: Podiatry

## 2023-07-05 DIAGNOSIS — M79674 Pain in right toe(s): Secondary | ICD-10-CM | POA: Diagnosis not present

## 2023-07-05 DIAGNOSIS — B351 Tinea unguium: Secondary | ICD-10-CM | POA: Diagnosis not present

## 2023-07-05 DIAGNOSIS — M79675 Pain in left toe(s): Secondary | ICD-10-CM | POA: Diagnosis not present

## 2023-07-05 DIAGNOSIS — B353 Tinea pedis: Secondary | ICD-10-CM

## 2023-07-05 MED ORDER — CLOTRIMAZOLE-BETAMETHASONE 1-0.05 % EX CREA: 1.0000 | TOPICAL_CREAM | Freq: Every day | CUTANEOUS | 1 refills | Status: AC

## 2023-07-05 MED ORDER — CLOTRIMAZOLE-BETAMETHASONE 1-0.05 % EX CREA: 1.0000 | TOPICAL_CREAM | Freq: Every day | CUTANEOUS | 0 refills | Status: DC

## 2023-07-05 NOTE — Progress Notes (Signed)
   Chief Complaint  Patient presents with   Ingrown Toenail    Rm#7 Bilateral big toe nails ongoing for several years.    Subjective: 43 y.o. female presenting today as a new patient for evaluation of thick discoloration of the toenails bilateral.  She also has occasional itching between the toes.  Her PCP actually prescribed her oral Lamisil but she has not begun taking it yet she presents for further treatment evaluation  Past Medical History:  Diagnosis Date   Abnormal Pap smear    Anxiety    Depression    Family history of adverse reaction to anesthesia    sister difficult to wake   GERD (gastroesophageal reflux disease)    Gestational diabetes    Hypertension    h/o prior to pregnancy-pt has now delivered and bp is under control-Pt will ask Dr Velora Mediate ob/gyn if she needs to restart this   Spinal headache    after epidural with section on 12-03-20-had blood patch and ha resolved   Tachycardia     Past Surgical History:  Procedure Laterality Date   CESAREAN SECTION N/A 12/03/2020   Procedure: CESAREAN SECTION;  Surgeon: Nadara Mustard, MD;  Location: [facility] ORS;  Service: Obstetrics;  Laterality: N/A;   LAPAROSCOPIC BILATERAL SALPINGECTOMY Bilateral 02/11/2021   Procedure: LAPAROSCOPIC BILATERAL SALPINGECTOMY;  Surgeon: Nadara Mustard, MD;  Location: [facility] ORS;  Service: Gynecology;  Laterality: Bilateral;   LEEP     PERIURETHRAL ABSCESS DRAINAGE     abscess removed from tonsils. Tonsils not removed   WISDOM TOOTH EXTRACTION      No Known Allergies   B/L toes 07/05/2023  Objective: Physical Exam General: The patient is alert and oriented x3 in no acute distress.  Dermatology: Hyperkeratotic, discolored, thickened, onychodystrophy noted. Skin is warm, dry and supple bilateral lower extremities. Negative for open lesions or macerations. Interdigital maceration also noted between the fourth and fifth digits bilateral  Vascular: Palpable pedal pulses bilaterally.  No edema or erythema noted. Capillary refill within normal limits.  Neurological: Grossly intact via light touch  Musculoskeletal Exam: No pedal deformity noted  Assessment: #1 Onychomycosis of toenails #2 Tinea pedis b/l   Plan of Care:  -Patient was evaluated. -Today we discussed different treatment options including oral, topical, and laser antifungal treatment modalities.  We discussed their efficacies and side effects.  Patient opts for oral antifungal treatment modality -Patient already has a prescription for Lamisil 250 mg #90 daily from her PCP.  She has not began taking it.  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 Felecia Shelling, DPM    [address] [place], [place] [address]                Office [phone]  Fax [phone]

## 2023-07-14 ENCOUNTER — Other Ambulatory Visit: Payer: Self-pay | Admitting: Family Medicine

## 2023-07-14 DIAGNOSIS — F411 Generalized anxiety disorder: Secondary | ICD-10-CM

## 2023-07-17 ENCOUNTER — Other Ambulatory Visit: Payer: Self-pay | Admitting: Family Medicine

## 2023-07-17 DIAGNOSIS — F411 Generalized anxiety disorder: Secondary | ICD-10-CM

## 2023-07-17 MED ORDER — ALPRAZOLAM 1 MG PO TABS: 0.5000 mg | ORAL_TABLET | ORAL | 0 refills | Status: DC | PRN

## 2023-08-10 ENCOUNTER — Encounter: Payer: Self-pay | Admitting: Family Medicine

## 2023-08-10 ENCOUNTER — Other Ambulatory Visit: Payer: Self-pay | Admitting: Family Medicine

## 2023-08-10 DIAGNOSIS — F411 Generalized anxiety disorder: Secondary | ICD-10-CM

## 2023-08-10 MED ORDER — ALPRAZOLAM 1 MG PO TABS: 0.5000 mg | ORAL_TABLET | ORAL | 0 refills | Status: DC | PRN

## 2023-08-11 ENCOUNTER — Other Ambulatory Visit: Payer: Self-pay | Admitting: Family Medicine

## 2023-08-11 MED ORDER — SEMAGLUTIDE-WEIGHT MANAGEMENT 2.4 MG/0.75ML ~~LOC~~ SOAJ: 2.4000 mg | SUBCUTANEOUS | 2 refills | Status: DC

## 2023-08-11 NOTE — Telephone Encounter (Signed)
 I can send in the next highest dose now

## 2023-09-11 ENCOUNTER — Other Ambulatory Visit: Payer: Self-pay | Admitting: Family Medicine

## 2023-09-11 DIAGNOSIS — F411 Generalized anxiety disorder: Secondary | ICD-10-CM

## 2023-09-11 MED ORDER — ALPRAZOLAM 1 MG PO TABS: 0.5000 mg | ORAL_TABLET | ORAL | 0 refills | Status: DC | PRN

## 2023-09-13 ENCOUNTER — Ambulatory Visit (HOSPITAL_BASED_OUTPATIENT_CLINIC_OR_DEPARTMENT_OTHER): Attending: Family Medicine | Admitting: Internal Medicine

## 2023-09-13 VITALS — Ht 62.0 in | Wt 169.0 lb

## 2023-09-13 DIAGNOSIS — E66811 Obesity, class 1: Secondary | ICD-10-CM | POA: Diagnosis not present

## 2023-09-13 DIAGNOSIS — R0683 Snoring: Secondary | ICD-10-CM | POA: Diagnosis not present

## 2023-09-13 DIAGNOSIS — R5383 Other fatigue: Secondary | ICD-10-CM | POA: Insufficient documentation

## 2023-09-19 ENCOUNTER — Ambulatory Visit (INDEPENDENT_AMBULATORY_CARE_PROVIDER_SITE_OTHER): Admitting: Family Medicine

## 2023-09-19 ENCOUNTER — Encounter: Payer: Self-pay | Admitting: Family Medicine

## 2023-09-19 VITALS — BP 117/78 | HR 114 | Ht 62.0 in | Wt 159.5 lb

## 2023-09-19 DIAGNOSIS — B351 Tinea unguium: Secondary | ICD-10-CM | POA: Diagnosis not present

## 2023-09-19 DIAGNOSIS — E785 Hyperlipidemia, unspecified: Secondary | ICD-10-CM

## 2023-09-19 DIAGNOSIS — R7301 Impaired fasting glucose: Secondary | ICD-10-CM | POA: Diagnosis not present

## 2023-09-19 DIAGNOSIS — E66811 Obesity, class 1: Secondary | ICD-10-CM

## 2023-09-19 DIAGNOSIS — R5383 Other fatigue: Secondary | ICD-10-CM | POA: Diagnosis not present

## 2023-09-19 MED ORDER — SEMAGLUTIDE-WEIGHT MANAGEMENT 2.4 MG/0.75ML ~~LOC~~ SOAJ: 2.4000 mg | SUBCUTANEOUS | 1 refills | Status: DC

## 2023-09-19 MED ORDER — ONDANSETRON 4 MG PO TBDP: 4.0000 mg | ORAL_TABLET | Freq: Three times a day (TID) | ORAL | 0 refills | Status: AC | PRN

## 2023-09-19 NOTE — Patient Instructions (Addendum)
 It was nice to see you today,  We addressed the following topics today: -I would recommend trying melatonin starting at 1 to 2 mg increasing up to 4 or 5 mg at most in addition to your Seroquel  and alprazolam  as needed. - I am sending in Zofran  for you to use as needed for nausea. - We are getting some blood test and will let you know the results and get them - I am sending in a 52-month supply of your Wegovy  that you can pick up when it is due again - I would recommend a calorie counting app, such as lose it or my fitness pal, and restricting your calories to less than 2000 cal/day and exercising by walking 30 minutes a day 5 days a week - I would also recommend looking into using your watch and phone to monitor your sleep patterns to get a better idea of what is causing your sleep issues. - For the concerns of skin pain/some burning sensation, I would try using over-the-counter creams starting with a menthol  containing cream and then something like Sarna, followed by hydrocortisone  1% if the previous 2 do not help.  Have a great day,  Etha Henle, MD

## 2023-09-19 NOTE — Assessment & Plan Note (Signed)
 Weight improved since starting wegovy .  On max dose currently.  Minimal side effects.  Complaining of sensitive skin on arms, possibly d/t medication.  No rash.  Continue current dose. Agreed to calorie counting, high protein diet, exercises 30 min/day 4-5 days perweek with mod intensity aerobic exercise.

## 2023-09-19 NOTE — Progress Notes (Signed)
 Established Patient Office Visit  Subjective   Patient ID: Tami Foster, female    DOB: 1981/02/03  Age: 43 y.o. MRN: [id number]  Chief Complaint  Patient presents with   Medication Management    HPI  Subjective - Follow up for sleep, onychomychosis, and Wegovy  - Terbinafine  for onychomycosis: reports minimal improvement, experiencing nausea with medication, sometimes skips doses due to nausea, takes at night to minimize symptoms - Applied antifungal cream for tinea pedis which was effective - Seeing podiatrist on 10/04/2023, podiatrist recommended bloodwork before next visit - Wegovy  2.4mg : reports weight loss from 174 lbs to current 159 lbs - Reports skin feels "sunburned" sensation since starting 1.7mg  dose of Wegovy , worse first few days after injection, improves toward end of week - Sleep issues: difficulty falling asleep, broken sleep throughout night, frequent waking - Completed home sleep study last week, results pending - discussed diet and exercise, specifically calorie counting, walking 93min/day x 5 days a week.    Medications: terbinafine  for toenail fungus (experiencing nausea), Wegovy  2.4mg  (experiencing skin sensitivity), metoprolol  for tachycardia, seroquel  for sleep/mood, alprazolam  0.5mg  (takes half tablet as needed for sleep), tried Ambien  in past with negative experience, tried melatonin with limited effectiveness.  PMH: tachycardia (since 2014), PCOS, irregular periods (regular since second child), hypertension (intermittent).  PSH: Tubal ligation.  Social Hx: Two children (11 years and 35 years old).  ROS: Positive for nausea with terbinafine , skin sensitivity with Wegovy , sleep disturbance, anxiety. Negative for itching.  The 10-year ASCVD risk score (Arnett DK, et al., 2019) is: 1.9%  Health Maintenance Due  Topic Date Due   Hepatitis C Screening  Never done   Pneumococcal Vaccine 62-87 Years old (1 of 2 - PCV) Never done   COVID-19 Vaccine (1 -  2024-25 season) Never done      Objective:     BP 117/78 (BP Location: Left Arm, Patient Position: Sitting)   Pulse (!) 114   Ht 5\' 2"  (1.575 m)   Wt 159 lb 8 oz (72.3 kg)   LMP 08/27/2023   SpO2 98%   BMI 29.17 kg/m    Physical Exam General: Alert, oriented Pulmonary: No respiratory distress Psych: Pleasant affect   No results found for any visits on 09/19/23.      Assessment & Plan:   Onychomycosis of great toe Assessment & Plan: Checking cmp today.  Has f/u with podiatry in two weeks. Continue terbinafine   Orders: -     Comprehensive metabolic panel with GFR  Dyslipidemia, goal LDL below 100 -     Lipid panel  Obesity, Class I, BMI 30-34.9 Assessment & Plan: Weight improved since starting wegovy .  On max dose currently.  Minimal side effects.  Complaining of sensitive skin on arms, possibly d/t medication.  No rash.  Continue current dose. Agreed to calorie counting, high protein diet, exercises 30 min/day 4-5 days perweek with mod intensity aerobic exercise.   Orders: -     Lipid panel -     Hemoglobin A1c  Other fatigue Assessment & Plan: Completed sleep study last week. Result not available yet.    Other orders -     Ondansetron ; Take 1 tablet (4 mg total) by mouth every 8 (eight) hours as needed for nausea or vomiting.  Dispense: 20 tablet; Refill: 0 -     Semaglutide -Weight Management; Inject 2.4 mg into the skin once a week.  Dispense: 9 mL; Refill: 1     No follow-ups on file.  Laneta Pintos, MD

## 2023-09-19 NOTE — Assessment & Plan Note (Addendum)
 Checking cmp today.  Has f/u with podiatry in two weeks. Continue terbinafine 

## 2023-09-19 NOTE — Assessment & Plan Note (Signed)
 Completed sleep study last week. Result not available yet.

## 2023-09-20 ENCOUNTER — Ambulatory Visit: Payer: Self-pay | Admitting: Family Medicine

## 2023-09-20 LAB — COMPREHENSIVE METABOLIC PANEL WITH GFR
ALT: 27 IU/L (ref 0–32)
AST: 19 IU/L (ref 0–40)
Albumin: 4.2 g/dL (ref 3.9–4.9)
Alkaline Phosphatase: 89 IU/L (ref 44–121)
BUN/Creatinine Ratio: 15 (ref 9–23)
BUN: 11 mg/dL (ref 6–24)
Bilirubin Total: 0.3 mg/dL (ref 0.0–1.2)
CO2: 19 mmol/L — ABNORMAL LOW (ref 20–29)
Calcium: 9.2 mg/dL (ref 8.7–10.2)
Chloride: 104 mmol/L (ref 96–106)
Creatinine, Ser: 0.72 mg/dL (ref 0.57–1.00)
Globulin, Total: 2.6 g/dL (ref 1.5–4.5)
Glucose: 91 mg/dL (ref 70–99)
Potassium: 4.3 mmol/L (ref 3.5–5.2)
Sodium: 140 mmol/L (ref 134–144)
Total Protein: 6.8 g/dL (ref 6.0–8.5)
eGFR: 108 mL/min/{1.73_m2} (ref >59)

## 2023-09-20 LAB — LIPID PANEL
Chol/HDL Ratio: 3.1 ratio (ref 0.0–4.4)
Cholesterol, Total: 157 mg/dL (ref 100–199)
HDL: 50 mg/dL (ref >39)
LDL Chol Calc (NIH): 89 mg/dL (ref 0–99)
Triglycerides: 95 mg/dL (ref 0–149)
VLDL Cholesterol Cal: 18 mg/dL (ref 5–40)

## 2023-09-20 LAB — HEMOGLOBIN A1C
Est. average glucose Bld gHb Est-mCnc: 103 mg/dL
Hgb A1c MFr Bld: 5.2 % (ref 4.8–5.6)

## 2023-09-24 DIAGNOSIS — E66811 Obesity, class 1: Secondary | ICD-10-CM | POA: Diagnosis not present

## 2023-09-24 NOTE — Procedures (Signed)
 [facility] [address] [place], [place] [address] Tel: [phone]   Fax: [phone]  Home Sleep Test Interpretation  Patient Name: Tami Foster, Tami Foster Date: 09/13/2023  Date of Birth: 04/10/1981 Study Type: HST  Age: 43 year MRN #: [id number]  Sex: Female Interpreting Physician: [employer] [id number]  Height: 5\' 2"  Referring Physician: Noreene Bearded  Weight: 169.0 lbs Recording Tech: Alvah Auerbach RRT RPSGT RST  BMI: 31.1 Scoring Tech: Alvah Auerbach RRT RPSGT RST  ESS: 5 Neck Size: 14.5   Indications for Polysomnography The patient is a 43 year-old Female who is 5\' 2"  and weighs 169.0 lbs. Her BMI equals 31.1.  A home sleep apnea test was performed to evaluate for -.OSA  Medication  No Data.   Polysomnogram Data A home sleep test recorded the standard physiologic parameters including EKG, nasal and oral airflow.  Respiratory parameters of chest and abdominal movements were recorded with Respiratory Inductance Plethysmography belts.  Oxygen saturation was recorded by pulse oximetry.   Study Architecture The total recording time of the polysomnogram was 376.9 minutes.  The total monitoring time was 377.5 minutes.  Time spent in Supine position was - minutes.   Respiratory Events The study revealed a presence of 2 obstructive, - central, and - mixed apneas resulting in an Apnea index of 0.3 events per hour.  There were 12 hypopneas (>=3% desaturation and/or arousal) resulting in an Apnea\Hypopnea Index (AHI >=3% desaturation and/or arousal) of 2.2 events per hour.  There were 9 hypopneas (>=4% desaturation) resulting in an Apnea\Hypopnea Index (AHI >=4% desaturation) of 1.7 events per hour.  There were - Respiratory Effort Related Arousals resulting in a RERA index of - events per hour. The Respiratory Disturbance Index is 2.2 events per hour.  The snore index was - events per hour.  Mean oxygen saturation was 94.7%.  The  lowest oxygen saturation during monitoring time was 71.0%.  Time spent <=88% oxygen saturation was 0.6 minutes (0.2%).  Cardiac Summary The average pulse rate was 92.3 bpm.  The minimum pulse rate was 75.0 bpm while the maximum pulse rate was 119.0 bpm.  Cardiac rhythm was normal  Comment: Occasional apneas and hypopneas, within normal limits, AHI (3%) 2.2/hr. Snoring with oxygen desaturation to a nadir of 71%, mean 94.7%.  Diagnosis: Normal study  Recommendations: None   This study was personally reviewed and electronically signed by: Dr. Faustina Hood Accredited Board Certified in Sleep Medicine Date/Time: 09/24/23   12:11   Study Overview  Recording Time: 551.7 min. Monitoring Time: 377.5 min.  Analysis Start:  10:51:46 PM Supine Time: - min.  Analysis Stop:  05:08:42 AM     Study Summary   Count Index Longest Event Duration  Apneas & Hypopneas: 14 2.2  Apneas: 18.2 sec.     Hypopneas: 50.8 sec.  RERAs: - - - sec.  Desaturations: 14 2.2 45.6 sec.  Snores: - - - sec.    Minimum Oxygen Saturation: 71.0%    Respiratory Summary   Total Duration Supine Non-Supine   Count Index Average Longest Count Index Count Index  Obstructive Apnea 2 0.3 15.8 18.2 - - 2 0.3   Mixed Apnea - - - - - - - -   Central Apnea - - - - - - - -   Total Apneas 2 0.3 15.8 18.2 - - 2 0.3            Hypopneas 3% 12 1.9 N.A. N.A. - -  12 1.9   Apneas & Hyp. 3% 14 2.2 N.A. N.A. - - 14 2.2            Hypopneas 4% 9 1.4 N.A. N.A. - - 9 1.4  Apneas & Hyp. 4% 11 1.7 N.A. N.A. - - 11 1.7             RERAs - - - - - - - -  RDI 14 2.2 N.A. N.A. - - 14 2.2   Oxygen Saturation Summary   Total Supine Non-Supine  Average SpO2 94.7% - 94.7%  Minimum SpO2 71.0% - 71.0%   Maximum SpO2 100.0% - 100.0%   Oxygen Saturation Distribution  Range (%) Time in range (min) Time in range (%)  90.0 - 100.0 358.6 99.2%  80.0 - 90.0 2.6 0.7%  70.0 - 80.0 0.3 0.1%  60.0 - 70.0 - -  50.0 - 60.0 - -  0.0 - 50.0  - -  Time Spent <=88% SpO2  Range (%) Time in range (min) Time in range (%)  0.0 - 88.0 0.6 0.2%  Cardiac Summary   Total Supine Non-Supine  Average Pulse Rate (BPM) 92.3 - 92.3  Minimum Pulse Rate (BPM) 75.0 - 75.0  Maximum Pulse Rate (BPM) 119.0 - 119.0                      Technologist Comments  -                        Jolee Naval, Biomedical engineer of Sleep Medicine  ELECTRONICALLY SIGNED ON:  09/24/2023, 12:07 PM [facility] PH: [phone]   FX: [phone] ACCREDITED BY THE AMERICAN ACADEMY OF SLEEP MEDICINE

## 2023-10-04 ENCOUNTER — Ambulatory Visit (INDEPENDENT_AMBULATORY_CARE_PROVIDER_SITE_OTHER): Payer: Medicaid Other | Admitting: Podiatry

## 2023-10-04 DIAGNOSIS — M79674 Pain in right toe(s): Secondary | ICD-10-CM

## 2023-10-04 DIAGNOSIS — B351 Tinea unguium: Secondary | ICD-10-CM | POA: Diagnosis not present

## 2023-10-04 DIAGNOSIS — M79675 Pain in left toe(s): Secondary | ICD-10-CM

## 2023-10-04 NOTE — Progress Notes (Signed)
   Chief Complaint  Patient presents with   Nail Problem    Patient is here to follow-up on onychomycosis of toenails on both feet.    Subjective: 43 y.o. female presenting today for follow-up evaluation of toenail fungus bilateral.  She has now completed 90 days of the oral Lamisil .  She has noticed some improvement at the base of the nail plate.  Recent CMP 09/19/2023 hepatic function WNL.  No new complaints  Past Medical History:  Diagnosis Date   Abnormal Pap smear    Anxiety    Depression    Family history of adverse reaction to anesthesia    sister difficult to wake   GERD (gastroesophageal reflux disease)    Gestational diabetes    Hypertension    h/o prior to pregnancy-pt has now delivered and bp is under control-Pt will ask Dr Atha Lazar ob/gyn if she needs to restart this   Spinal headache    after epidural with section on 12-03-20-had blood patch and ha resolved   Tachycardia     Past Surgical History:  Procedure Laterality Date   CESAREAN SECTION N/A 12/03/2020   Procedure: CESAREAN SECTION;  Surgeon: Alben Alma, MD;  Location: [facility] ORS;  Service: Obstetrics;  Laterality: N/A;   LAPAROSCOPIC BILATERAL SALPINGECTOMY Bilateral 02/11/2021   Procedure: LAPAROSCOPIC BILATERAL SALPINGECTOMY;  Surgeon: Alben Alma, MD;  Location: [facility] ORS;  Service: Gynecology;  Laterality: Bilateral;   LEEP     PERIURETHRAL ABSCESS DRAINAGE     abscess removed from tonsils. Tonsils not removed   WISDOM TOOTH EXTRACTION      No Known Allergies   B/L toes 07/05/2023  Objective: Physical Exam General: The patient is alert and oriented x3 in no acute distress.  Dermatology: Improvement at the base of the nail plate.  Remaining nail hyperkeratotic, discolored, thickened, onychodystrophy noted. Skin is warm, dry and supple bilateral lower extremities. Negative for open lesions or macerations. Interdigital maceration also noted between the fourth and fifth digits  bilateral  Vascular: Palpable pedal pulses bilaterally. No edema or erythema noted. Capillary refill within normal limits.  Neurological: Grossly intact via light touch  Musculoskeletal Exam: No pedal deformity noted  Assessment: #1 Onychomycosis of toenails #2 Tinea pedis b/l   Plan of Care:  -Patient was evaluated.  Mechanical debridement of the bilateral great toenails was performed today using a nail nipper without incident or bleeding.  Sleeping with a rotary bur is courtesy for the patient. - Tolerating the oral Lamisil  well.  Refill prescription for Lamisil  250 mg #90 daily -Continue prescribed Lotrisone  cream PRN -Return to clinic 6 months  *Currently not working. She is an Dance movement psychotherapist though   Dot Gazella, DPM [facility]  Dr. Dot Gazella, DPM    [address] [place], [place] [address]                Office [phone]  Fax [phone]

## 2023-10-05 ENCOUNTER — Other Ambulatory Visit: Payer: Self-pay | Admitting: Family Medicine

## 2023-10-05 DIAGNOSIS — B351 Tinea unguium: Secondary | ICD-10-CM

## 2023-10-20 ENCOUNTER — Other Ambulatory Visit: Payer: Self-pay | Admitting: Family Medicine

## 2023-10-20 DIAGNOSIS — F411 Generalized anxiety disorder: Secondary | ICD-10-CM

## 2023-11-02 ENCOUNTER — Encounter: Payer: Self-pay | Admitting: Family Medicine

## 2023-11-15 ENCOUNTER — Other Ambulatory Visit: Payer: Self-pay | Admitting: Family Medicine

## 2023-11-15 DIAGNOSIS — F411 Generalized anxiety disorder: Secondary | ICD-10-CM

## 2023-11-15 DIAGNOSIS — F5104 Psychophysiologic insomnia: Secondary | ICD-10-CM

## 2023-11-15 DIAGNOSIS — F321 Major depressive disorder, single episode, moderate: Secondary | ICD-10-CM

## 2023-11-27 ENCOUNTER — Other Ambulatory Visit: Payer: Self-pay | Admitting: Family Medicine

## 2023-11-27 DIAGNOSIS — F411 Generalized anxiety disorder: Secondary | ICD-10-CM

## 2023-12-07 ENCOUNTER — Other Ambulatory Visit: Payer: Self-pay | Admitting: Family Medicine

## 2023-12-07 DIAGNOSIS — F5104 Psychophysiologic insomnia: Secondary | ICD-10-CM

## 2023-12-07 DIAGNOSIS — F411 Generalized anxiety disorder: Secondary | ICD-10-CM

## 2023-12-07 DIAGNOSIS — F321 Major depressive disorder, single episode, moderate: Secondary | ICD-10-CM

## 2023-12-20 ENCOUNTER — Encounter: Payer: Self-pay | Admitting: Family Medicine

## 2023-12-20 ENCOUNTER — Ambulatory Visit (INDEPENDENT_AMBULATORY_CARE_PROVIDER_SITE_OTHER): Admitting: Family Medicine

## 2023-12-20 VITALS — BP 127/89 | HR 95 | Ht 62.0 in | Wt 157.0 lb

## 2023-12-20 DIAGNOSIS — M2550 Pain in unspecified joint: Secondary | ICD-10-CM | POA: Diagnosis not present

## 2023-12-20 DIAGNOSIS — F17200 Nicotine dependence, unspecified, uncomplicated: Secondary | ICD-10-CM | POA: Diagnosis not present

## 2023-12-20 DIAGNOSIS — I10 Essential (primary) hypertension: Secondary | ICD-10-CM | POA: Diagnosis not present

## 2023-12-20 DIAGNOSIS — N951 Menopausal and female climacteric states: Secondary | ICD-10-CM | POA: Diagnosis not present

## 2023-12-20 DIAGNOSIS — B351 Tinea unguium: Secondary | ICD-10-CM

## 2023-12-20 DIAGNOSIS — M1990 Unspecified osteoarthritis, unspecified site: Secondary | ICD-10-CM

## 2023-12-20 MED ORDER — NICOTINE POLACRILEX 4 MG MT GUM: 4.0000 mg | CHEWING_GUM | OROMUCOSAL | 0 refills | Status: DC | PRN

## 2023-12-20 MED ORDER — COMBIPATCH 0.05-0.14 MG/DAY TD PTTW: 1.0000 | MEDICATED_PATCH | TRANSDERMAL | 12 refills | Status: AC

## 2023-12-20 MED ORDER — NICOTINE 21 MG/24HR TD PT24: 21.0000 mg | MEDICATED_PATCH | Freq: Every day | TRANSDERMAL | 1 refills | Status: DC

## 2023-12-20 NOTE — Patient Instructions (Signed)
 It was nice to see you today,  We addressed the following topics today: -I have sent in a estrogen/progesterone  patch to use weekly.  This should help with your perimenopausal symptoms - I am also getting some lab test to look at other causes of your joint pain. - I am sending in prescriptions for both the nicotine  patch and nicotine  gum.  You can use both.  Put the nicotine  patch on (opposite arm of your Climara ) and every day.  You can use the gum as needed for times when you would typically reach for a cigarette if you are still craving 1. - You can call 1 800 quit NOW to get a month supply of nicotine  replacement for flying.  Have a great day,  Rolan Slain, MD

## 2023-12-20 NOTE — Progress Notes (Signed)
 Established Patient Office Visit  Subjective   Patient ID: Tami Foster, female    DOB: 01-10-1981  Age: 43 y.o. MRN: [id number]  Chief Complaint  Patient presents with   Medical Management of Chronic Issues    HPI  Subjective - New onset of frequent night sweats and increased moodiness, concerning for perimenopausal symptoms. Reports waking up drenched in sweat. Denies this is new but it is more frequent, similar to when pregnant. Last menstrual period was on 11/23/2023. Periods have been regular monthly since tubal ligation 3 years ago, but were previously irregular. Describes periods as having cramping for a week prior, with one very heavy day followed by 1-2 very light days. - New onset of diffuse joint pain, affecting hands, elbows, ankles, and hips. Pain is worse when lying down. Joints feel stiff. Denies tenderness to touch. No current treatment. - Reports a desire to quit smoking. Has been smoking more recently due to stress. Is concerned about the habit of smoking, not just the nicotine  addiction. Husband uses a vape, but does not want to switch to vaping. - onychomycosis is improving. Stopped taking Lamisil  two weeks ago, with one month of the prescription remaining, as instructed by podiatrist to be off of it for three months before the November follow-up appointment. - Previous issues with Wegovy  prescription refill have been resolved.  Medications Currently taking metoprolol  50 mg daily, originally for tachycardia. Previously tried venlafaxine , duloxetine , paroxetine , and sertraline  (around 2015) for mood. Recently completed a course of Lamisil  for onychomycosis. Currently using Wegovy . Discussed options for perimenopausal symptoms including hormonal therapy (estrogen/progesterone ) and non-hormonal options (SSRIs, gabapentin, Veozah). Discussed smoking cessation aids including nicotine  patch and nicotine  gum/lozenge. Discussed magnesium  complex for symptoms.  PMH, PSH, FH,  Social Hx PMH: History of PCOS diagnosed as a teenager. Historically irregular menses. Headaches, controlled with Advil . History of arthritis in back and neck. High blood pressure. PSH: Tubal ligation 3 years ago. History of multiple LEEP procedures. History of dural puncture post-epidural requiring a blood patch. FH: Sister with endometriosis. Mother with bladder cancer. Father with colon cancer. No known family history of breast, ovarian, or endometrial cancer. Social Hx: Smokes cigarettes, increased use recently due to stress. Has four children.  ROS Constitutional: Positive for night sweats. Psychiatric: Positive for increased moodiness/irritability. Neurological: Positive for headaches. Denies migraines. Musculoskeletal: Positive for diffuse joint pain and stiffness in hands, elbows, ankles, and hips. Endocrine: LMP 11/23/2023. Periods are regular but with prolonged cramping and variable flow.   The 10-year ASCVD risk score (Arnett DK, et al., 2019) is: 2.3%  Health Maintenance Due  Topic Date Due   Hepatitis C Screening  Never done   Pneumococcal Vaccine (1 of 2 - PCV) Never done   Hepatitis B Vaccines 19-59 Average Risk (1 of 3 - 19+ 3-dose series) Never done   HPV VACCINES (1 - 3-dose SCDM series) Never done   COVID-19 Vaccine (1 - 2024-25 season) Never done   INFLUENZA VACCINE  12/08/2023      Objective:     BP 127/89   Pulse 95   Ht 5' 2 (1.575 m)   Wt 157 lb (71.2 kg)   LMP 11/23/2023   SpO2 99%   BMI 28.72 kg/m    Physical Exam Gen: alert, oriented Cv: rrr Pulm: lctab Msk: no swelling or tenderness of the joints of the hands or elbows      Assessment & Plan:   Perimenopausal vasomotor symptoms Assessment & Plan: Presents with new onset  of frequent night sweats and moodiness concerning for perimenopause. Denies contraindications to hormonal therapy such as personal history of blood clots or family history of breast/ovarian cancer. Discussed hormonal  vs. non-hormonal treatment options. Prefers a non-daily option. - Prescribe combination estrogen/progesterone  transdermal patch. Sent to [facility]. Counseled on risks including blood clots and the increased risk with smoking. - Discussed magnesium  complex as a supplement; counseled that it may help with sleep and cramping and is unlikely to affect blood pressure at standard doses. Advised about potential for loose stools with high doses.   Arthralgia, unspecified joint -     C-reactive protein -     ANA w/Reflex if Positive -     Sedimentation rate -     Rheumatoid factor  Smoker Assessment & Plan: Reports increased smoking due to stress and expresses a desire to quit. Understands the habits associated with smoking are a major barrier. Counseled on increased risk of VTE with combined estrogen therapy and smoking. - Prescribe nicotine  replacement therapy (NRT). - Plan for combination therapy with nicotine  patch (21 mg) and nicotine  gum or lozenge (2 or 4 mg) for breakthrough cravings. - Provided education on proper use of nicotine  gum (chew and park method). - Provided 1-800-QUIT-NOW resource. - Will consider Chantix in the future if NRT is not effective.   Primary hypertension Assessment & Plan: History of hypertension, treated with metoprolol  50 mg daily. Reports blood pressure is high. BP elevated in office. Home BP monitor is broken. - repeat bp was improved, in the normal range.  Continue to monitor   Onychomycosis of great toe Assessment & Plan: Toenails are improving with treatment. Following up with podiatry in November. - Continue plan per podiatry.   Chronic inflammatory arthritis Assessment & Plan: New onset of joint pain and stiffness in multiple joints. Not tender on examination. - Will order labs to evaluate for inflammatory arthritis, including rheumatoid factor.   Other orders -     Nicotine ; Place 1 patch (21 mg total) onto the skin daily.  Dispense: 28  patch; Refill: 1 -     Nicotine  Polacrilex; Take 1 each (4 mg total) by mouth as needed for smoking cessation.  Dispense: 100 tablet; Refill: 0 -     CombiPatch ; Place 1 patch onto the skin 2 (two) times a week.  Dispense: 8 patch; Refill: 12     Return in about 4 weeks (around 01/17/2024) for vasomotor symptoms, smoking.    Toribio MARLA Slain, MD

## 2023-12-21 LAB — ANA W/REFLEX IF POSITIVE: Anti Nuclear Antibody (ANA): NEGATIVE

## 2023-12-21 LAB — SEDIMENTATION RATE: Sed Rate: 14 mm/h (ref 0–32)

## 2023-12-21 LAB — C-REACTIVE PROTEIN: CRP: <1 mg/L (ref 0–10)

## 2023-12-21 LAB — RHEUMATOID FACTOR: Rheumatoid fact SerPl-aCnc: <10 [IU]/mL (ref <14.0)

## 2023-12-22 ENCOUNTER — Ambulatory Visit: Payer: Self-pay | Admitting: Family Medicine

## 2023-12-22 DIAGNOSIS — I10 Essential (primary) hypertension: Secondary | ICD-10-CM | POA: Insufficient documentation

## 2023-12-22 DIAGNOSIS — N951 Menopausal and female climacteric states: Secondary | ICD-10-CM | POA: Insufficient documentation

## 2023-12-22 NOTE — Assessment & Plan Note (Signed)
 History of hypertension, treated with metoprolol  50 mg daily. Reports blood pressure is high. BP elevated in office. Home BP monitor is broken. - repeat bp was improved, in the normal range.  Continue to monitor

## 2023-12-22 NOTE — Assessment & Plan Note (Signed)
 Toenails are improving with treatment. Following up with podiatry in November. - Continue plan per podiatry.

## 2023-12-22 NOTE — Assessment & Plan Note (Signed)
 Reports increased smoking due to stress and expresses a desire to quit. Understands the habits associated with smoking are a major barrier. Counseled on increased risk of VTE with combined estrogen therapy and smoking. - Prescribe nicotine  replacement therapy (NRT). - Plan for combination therapy with nicotine  patch (21 mg) and nicotine  gum or lozenge (2 or 4 mg) for breakthrough cravings. - Provided education on proper use of nicotine  gum (chew and park method). - Provided 1-800-QUIT-NOW resource. - Will consider Chantix in the future if NRT is not effective.

## 2023-12-22 NOTE — Assessment & Plan Note (Signed)
 Presents with new onset of frequent night sweats and moodiness concerning for perimenopause. Denies contraindications to hormonal therapy such as personal history of blood clots or family history of breast/ovarian cancer. Discussed hormonal vs. non-hormonal treatment options. Prefers a non-daily option. - Prescribe combination estrogen/progesterone  transdermal patch. Sent to [facility]. Counseled on risks including blood clots and the increased risk with smoking. - Discussed magnesium  complex as a supplement; counseled that it may help with sleep and cramping and is unlikely to affect blood pressure at standard doses. Advised about potential for loose stools with high doses.

## 2023-12-22 NOTE — Assessment & Plan Note (Signed)
 New onset of joint pain and stiffness in multiple joints. Not tender on examination. - Will order labs to evaluate for inflammatory arthritis, including rheumatoid factor.

## 2024-01-23 ENCOUNTER — Encounter: Payer: Self-pay | Admitting: Family Medicine

## 2024-01-23 ENCOUNTER — Ambulatory Visit (INDEPENDENT_AMBULATORY_CARE_PROVIDER_SITE_OTHER): Admitting: Family Medicine

## 2024-01-23 VITALS — BP 119/84 | HR 93 | Ht 62.0 in | Wt 158.4 lb

## 2024-01-23 DIAGNOSIS — E66811 Obesity, class 1: Secondary | ICD-10-CM | POA: Diagnosis not present

## 2024-01-23 DIAGNOSIS — F17200 Nicotine dependence, unspecified, uncomplicated: Secondary | ICD-10-CM

## 2024-01-23 DIAGNOSIS — N951 Menopausal and female climacteric states: Secondary | ICD-10-CM | POA: Diagnosis not present

## 2024-01-23 DIAGNOSIS — M19042 Primary osteoarthritis, left hand: Secondary | ICD-10-CM

## 2024-01-23 DIAGNOSIS — M19041 Primary osteoarthritis, right hand: Secondary | ICD-10-CM | POA: Diagnosis not present

## 2024-01-23 NOTE — Assessment & Plan Note (Signed)
-   Continues to smoke but has reduced intake with the use of nicotine  gum. Having difficulty with proper use of the gum, leading to side effects. Expressed concern about starting Chantix. - Continue nicotine  replacement therapy. Discussed potential benefit of nicotine  lozenges as an alternative to gum. Has refills available for nicotine  patches and gum.

## 2024-01-23 NOTE — Assessment & Plan Note (Signed)
-   Reports persistent joint pain, primarily in hands. Hip pain has improved. Lab workup for inflammatory arthritis was negative, making osteoarthritis the most likely diagnosis. - Discussed over-the-counter topical treatments. Recommended trying Voltaren  gel and other topical analgesics like lidocaine  or menthol -based products. - Discussed that hand X-rays could be done to further evaluate but are not strictly necessary at this time as it would likely confirm osteoarthritis.

## 2024-01-23 NOTE — Assessment & Plan Note (Signed)
-   Presents for follow-up on CombiPatch  initiation. Reports improvement in vasomotor symptoms including night sweats and increased energy levels. Has experienced one episode of breakthrough bleeding. History of irregular menses. - Continue CombiPatch . Advised on alternative application sites to improve adhesion, including more lateral abdominal areas or potentially thigh/buttock, though abdomen is preferred. Advised can use an overlay dressing like Tegaderm for better adhesion.

## 2024-01-23 NOTE — Patient Instructions (Signed)
 It was nice to see you today,  We addressed the following topics today: -Your testing for inflammatory arthritis was negative.  You likely have osteoarthritis.  You can use over-the-counter topical medications such as Voltaren  gel, menthol , lidocaine  containing creams as needed. - Continue to try to reduce your smoking using nicotine .   Have a great day,  Rolan Slain, MD

## 2024-01-23 NOTE — Assessment & Plan Note (Signed)
-   Taking Wegovy . Reports weight plateau since May, stable around 158 lbs. Acknowledges potential increase in caloric intake due to smoking cessation efforts. - Continue Wegovy . - Discussed that if plateau persists, may consider switching to Zepbound, pending insurance coverage.

## 2024-01-23 NOTE — Progress Notes (Unsigned)
 Established Patient Office Visit  Subjective   Patient ID: Tami Foster, female    DOB: Jun 21, 1980  Age: 43 y.o. MRN: [id number]  Chief Complaint  Patient presents with   Medical Management of Chronic Issues    HPI  Subjective - Follow-up from 1 month ago. Reports no new issues.  - Menstrual irregularity/Breakthrough bleeding: Reports two menstrual periods since starting the CombiPatch , one on the 15th and one on the 28th of last month. Describes the second period as dark and clumpy, consistent with old blood. Denies increased heaviness or pain. Reports history of irregular periods since age 48, sometimes going 6 months to a year without a cycle. Periods became regular monthly after tubal ligation. Prior to starting the patch, periods were becoming shorter and heavier for one day, with significant cramping for a week prior. The patch has improved night sweats and reports having more energy.  - Tobacco use: Continues to smoke but has cut back. Using nicotine  gum but not the patch yet. Reports chewing the gum too fast, which causes a tingling sensation and nausea. Considers the lozenge might be a better option. Declines Chantix due to concerns about side effects (night terrors, nausea) experienced by a family member.  - Joint pain: Reports persistent pain in hands. Hip pain has resolved since starting hormone therapy.  - Weight management: Feels they are at a plateau with Wegovy . Weight has been stable around 158 lbs since May. Attributes this to possibly eating more while trying to quit smoking.  Medications On CombiPatch , reports issue with patch adhesion at the application site on the abdomen due to clothing and skin folds. Currently using nicotine  gum for smoking cessation. Taking Wegovy  for weight management.  PMH, PSH, FH, Social Hx PMHx: Osteoarthritis in the neck and lower spine confirmed by previous x-rays. Lifelong irregular menses. PSH: Tubal ligation. FH: History of  cancer on maternal side. No confirmed family history of inflammatory arthritis. Social Hx: Smokes tobacco, currently attempting to quit. 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  ROS Constitutional: Reports improved energy, decreased night sweats. Denies weight loss. Musculoskeletal: Reports joint pain in hands, improved hip pain.      The 10-year ASCVD risk score (Arnett DK, et al., 2019) is: 2%  Health Maintenance Due  Topic Date Due   Hepatitis C Screening  Never done   Pneumococcal Vaccine (1 of 2 - PCV) Never done   Hepatitis B Vaccines 19-59 Average Risk (1 of 3 - 19+ 3-dose series) Never done   HPV VACCINES (1 - 3-dose SCDM series) Never done   Mammogram  Never done   Influenza Vaccine  Never done   COVID-19 Vaccine (1 - 2024-25 season) Never done      Objective:     BP 119/84   Pulse 93   Ht 5' 2 (1.575 m)   Wt 158 lb 6.4 oz (71.8 kg)   SpO2 100%   BMI 28.97 kg/m  {Vitals History (Optional):23777}  Physical Exam Gen: alert, oriented Pulm: no resp distress Psych: pleasant affect   No results found for any visits on 01/23/24.      Assessment & Plan:   Obesity, Class I, BMI 30-34.9 Assessment & Plan: - Taking Wegovy . Reports weight plateau since May, stable around 158 lbs. Acknowledges potential increase in caloric intake due to smoking cessation efforts. - Continue Wegovy . - Discussed that if plateau persists, may consider switching to Zepbound, pending insurance coverage.   Primary osteoarthritis of both hands Assessment &  Plan: - Reports persistent joint pain, primarily in hands. Hip pain has improved. Lab workup for inflammatory arthritis was negative, making osteoarthritis the most likely diagnosis. - Discussed over-the-counter topical treatments. Recommended trying Voltaren  gel and other topical analgesics like lidocaine  or menthol -based products. - Discussed that hand X-rays could be done to further evaluate but are not strictly  necessary at this time as it would likely confirm osteoarthritis.   Smoker Assessment & Plan: - Continues to smoke but has reduced intake with the use of nicotine  gum. Having difficulty with proper use of the gum, leading to side effects. Expressed concern about starting Chantix. - Continue nicotine  replacement therapy. Discussed potential benefit of nicotine  lozenges as an alternative to gum. Has refills available for nicotine  patches and gum.   Perimenopausal vasomotor symptoms Assessment & Plan: - Presents for follow-up on CombiPatch  initiation. Reports improvement in vasomotor symptoms including night sweats and increased energy levels. Has experienced breakthrough bleeding, which is a common side effect. History of irregular menses. - Continue CombiPatch . Advised on alternative application sites to improve adhesion, including more lateral abdominal areas or potentially thigh/buttock, though abdomen is preferred. Advised can use an overlay dressing like Tegaderm for better adhesion.      Return in about 3 months (around 04/23/2024) for weight, smoking.    Tami Tami Slain, MD

## 2024-02-05 ENCOUNTER — Other Ambulatory Visit: Payer: Self-pay | Admitting: Family Medicine

## 2024-02-05 DIAGNOSIS — F411 Generalized anxiety disorder: Secondary | ICD-10-CM

## 2024-02-23 ENCOUNTER — Encounter: Payer: Self-pay | Admitting: Family Medicine

## 2024-03-12 DIAGNOSIS — R059 Cough, unspecified: Secondary | ICD-10-CM | POA: Diagnosis not present

## 2024-03-12 DIAGNOSIS — J029 Acute pharyngitis, unspecified: Secondary | ICD-10-CM | POA: Diagnosis not present

## 2024-03-12 DIAGNOSIS — J069 Acute upper respiratory infection, unspecified: Secondary | ICD-10-CM | POA: Diagnosis not present

## 2024-03-28 ENCOUNTER — Other Ambulatory Visit: Payer: Self-pay | Admitting: Family Medicine

## 2024-03-28 DIAGNOSIS — R000 Tachycardia, unspecified: Secondary | ICD-10-CM

## 2024-03-28 DIAGNOSIS — R002 Palpitations: Secondary | ICD-10-CM

## 2024-03-29 ENCOUNTER — Other Ambulatory Visit (HOSPITAL_COMMUNITY): Payer: Self-pay

## 2024-03-29 ENCOUNTER — Telehealth: Payer: Self-pay | Admitting: Pharmacy Technician

## 2024-03-29 NOTE — Telephone Encounter (Signed)
 Pharmacy Patient Advocate Encounter  Received notification from [facility] MEDICAID that Prior Authorization for QUEtiapine  Fumarate 50MG  tablets has been APPROVED from 03/29/2024 to 03/29/2025. Ran test claim, Copay is $4.00. This test claim was processed through [facility]- copay amounts may vary at other pharmacies due to pharmacy/plan contracts, or as the patient moves through the different stages of their insurance plan.   PA #/Case ID/Reference #: [id number]

## 2024-03-29 NOTE — Telephone Encounter (Signed)
 Pharmacy Patient Advocate Encounter   Received notification from Onbase that prior authorization for QUEtiapine  Fumarate 50MG  tablets is required/requested.   Insurance verification completed.   The patient is insured through [facility] MEDICAID.   Per test claim: PA required; PA submitted to above mentioned insurance via Latent Key/confirmation #/EOC [facility] Status is pending

## 2024-04-01 ENCOUNTER — Encounter: Payer: Self-pay | Admitting: Family Medicine

## 2024-04-02 ENCOUNTER — Ambulatory Visit: Payer: Self-pay

## 2024-04-02 ENCOUNTER — Ambulatory Visit (INDEPENDENT_AMBULATORY_CARE_PROVIDER_SITE_OTHER): Admitting: Family Medicine

## 2024-04-02 ENCOUNTER — Encounter: Payer: Self-pay | Admitting: Family Medicine

## 2024-04-02 VITALS — BP 116/79 | HR 85 | Ht 62.0 in | Wt 168.1 lb

## 2024-04-02 DIAGNOSIS — R21 Rash and other nonspecific skin eruption: Secondary | ICD-10-CM | POA: Insufficient documentation

## 2024-04-02 NOTE — Telephone Encounter (Signed)
 Appt scheduled today in the 2:50pm slot

## 2024-04-02 NOTE — Assessment & Plan Note (Signed)
 Rash and Arthralgia, likely post-viral or reactive vs. allergic reaction. - The presentation of rash and joint pain began Sunday. Rash has improved significantly after a single dose of antihistamines. Joint pain and swelling persist. A possible trigger is new food exposure (fish sticks). An alternative etiology is a reactive or post-viral process following an upper respiratory infection 3 weeks ago. An autoimmune process is less likely given recent negative ANA and the acute, self-resolving nature of the rash. - Continue to monitor symptoms. - If rash returns or symptoms worsen, will consider a course of oral steroids. - For joint pain and swelling, recommend a trial of naproxen  (Aleve ). - Will re-evaluate at the upcoming follow-up appointment in approximately 3 weeks. If symptoms persist, will consider repeating autoimmune labs and obtaining joint X-rays.

## 2024-04-02 NOTE — Patient Instructions (Signed)
 It was nice to see you today,  We addressed the following topics today: - For pain and swelling, you can take an over-the-counter anti-inflammatory like naproxen  (Aleve ) for up to two weeks. If you are still having significant joint pain after two weeks, please let me know. - If the rash comes back, or if the swelling and pain get significantly worse, contact the office as we may need to prescribe a course of steroids. - Please go to the emergency department if you develop any difficulty breathing, difficulty swallowing, or severe swelling of an entire limb.  Have a great day,  Rolan Slain, MD

## 2024-04-02 NOTE — Telephone Encounter (Signed)
 Copied from CRM #[id number]. Topic: Clinical - Red Word Triage >> Apr 02, 2024 10:30 AM Larissa RAMAN wrote: [facility] that prompted transfer to Nurse Triage: pain in joints. rash -arm, stomach, face Reason for Disposition . Mild widespread rash  (Exception: Heat rash lasting 3 days or less.)  Answer Assessment - Initial Assessment Questions 1. APPEARANCE of RASH: What does the rash look like? (e.g., blisters, dry flaky skin, red spots, redness, sores)     Rash is going away, light blotchness; slight itching, denies openings, drainage 2. SIZE: How big are the spots? (e.g., tip of pen, eraser, coin; inches, centimeters)     Tip of pen arms, torso raised; looked like hives but not raised 3. LOCATION: Where is the rash located?     Generalized joint pain and rash; took bendardyl Swelling in hands and knees, denies redness 4. COLOR: What color is the rash? (Note: It is difficult to assess rash color in people with darker-colored skin. When this situation occurs, simply ask the caller to describe what they see.)     redness 5. ONSET: When did the rash begin?     Sunday 6. FEVER: Do you have a fever? If Yes, ask: What is your temperature, how was it measured, and when did it start?     Denies fever, chills, n/v 7. ITCHING: Does the rash itch? If Yes, ask: How bad is the itch? (Scale 1-10; or mild, moderate, severe)     mild 8. CAUSE: What do you think is causing the rash?     unsure 10. OTHER SYMPTOMS: Do you have any other symptoms? (e.g., dizziness, headache, sore throat, joint pain)       HA, joint pain, dizziness, cough Denies sore throat,  [facility] 03/12/24; negative for covid, flu, strep. Given prednisone  and not sure if medication caused it, stopped meds did not finish due to swelling.  Protocols used: Rash or Redness - [facility]

## 2024-04-02 NOTE — Progress Notes (Unsigned)
   Acute Office Visit  Subjective:     Patient ID: Tami Foster, female    DOB: [birth year], 43 y.o.   MRN: [id number]  Chief Complaint  Patient presents with   Rash    HPI Patient is in today for   Subjective - Rash and arthralgia. Reports onset of rash on Sunday, 03/31/2024. Rash involved both arms, legs, stomach, and back. Denies rash on the face. Reports associated joint swelling and pain, described as puffy. Affects hands, knees, and elbows. Hand pain is significant, with difficulty moving fingers. Reports associated generalized aching and night sweats. Rash was mildly pruritic. Symptoms were worse in the morning. - Possible trigger was fish sticks eaten on Saturday, which is a new food. Does not typically eat fish but tolerates shrimp. No other new exposures identified. - Took Zyrtec and Benadryl  on Sunday evening with some improvement in the rash the next day. Took ibuprofen  for pain. - Had an upper respiratory infection approximately 3 weeks ago. Was seen at an urgent care and diagnosed with a probable bacterial infection due to a white pocket in the throat, though tests for COVID, flu, and strep were negative. Was treated with a steroid shot, another unspecified injection, doxycycline , and a 7-day course of prednisone . Did not complete the full course of doxycycline  and prednisone  due to experiencing swelling. The swelling resolved after stopping the medications.  Medications Current medications include metoprolol , Seroquel , clonazepam , and estrogen patch. Wegovy  has been discontinued. Has other PRN medications not used in the past two weeks.  PMH, PSH, FH, Social Hx PMHx: Upper respiratory infection 3 weeks prior. Negative ANA and RA workup 3 months ago.  ROS Pertinent Positives: Headache (on and off), rash, joint pain, joint swelling, night sweats. Pertinent Negatives: No fever, nausea, vomiting, or vision changes.   ROS      Objective:    BP 116/79   Pulse 85    Ht 5' 2 (1.575 m)   Wt 168 lb 1.9 oz (76.3 kg)   SpO2 99%   BMI 30.75 kg/m    Physical Exam General: Appears well. MSK: Swelling noted in finger joints. Rings appear tight. Pain with hand squeeze. Full range of motion of fingers is limited by stiffness and pain. Pain on movement of knees and ankles. SKIN: No active rash visible on exam. Photos from Sunday reviewed showing a diffuse erythematous rash on arms, legs, stomach, and back.  No results found for any visits on 04/02/24.      Assessment & Plan:   Rash Assessment & Plan: Rash and Arthralgia, likely post-viral or reactive vs. allergic reaction. - The presentation of rash and joint pain began Sunday. Rash has improved significantly after a single dose of antihistamines. Joint pain and swelling persist. A possible trigger is new food exposure (fish sticks). An alternative etiology is a reactive or post-viral process following an upper respiratory infection 3 weeks ago. An autoimmune process is less likely given recent negative ANA and the acute, self-resolving nature of the rash. - Continue to monitor symptoms. - If rash returns or symptoms worsen, will consider a course of oral steroids. - For joint pain and swelling, recommend a trial of naproxen  (Aleve ). - Will re-evaluate at the upcoming follow-up appointment in approximately 3 weeks. If symptoms persist, will consider repeating autoimmune labs and obtaining joint X-rays.      Return for on file.  Toribio MARLA Slain, MD

## 2024-04-02 NOTE — Telephone Encounter (Signed)
 Called [facility], spoke with April. April states transfer over, call transferred.

## 2024-04-03 ENCOUNTER — Ambulatory Visit: Admitting: Podiatry

## 2024-04-15 ENCOUNTER — Other Ambulatory Visit: Payer: Self-pay

## 2024-04-15 ENCOUNTER — Other Ambulatory Visit: Payer: Self-pay | Admitting: Family Medicine

## 2024-04-15 DIAGNOSIS — F411 Generalized anxiety disorder: Secondary | ICD-10-CM

## 2024-04-15 MED ORDER — ALPRAZOLAM 1 MG PO TABS: ORAL_TABLET | ORAL | 1 refills | Status: AC

## 2024-04-23 ENCOUNTER — Ambulatory Visit: Admitting: Family Medicine

## 2024-04-24 ENCOUNTER — Encounter: Payer: Self-pay | Admitting: Podiatry

## 2024-04-24 ENCOUNTER — Ambulatory Visit (INDEPENDENT_AMBULATORY_CARE_PROVIDER_SITE_OTHER): Admitting: Podiatry

## 2024-04-24 VITALS — Ht 62.0 in | Wt 168.1 lb

## 2024-04-24 DIAGNOSIS — B351 Tinea unguium: Secondary | ICD-10-CM | POA: Diagnosis not present

## 2024-04-24 DIAGNOSIS — M79675 Pain in left toe(s): Secondary | ICD-10-CM | POA: Diagnosis not present

## 2024-04-24 DIAGNOSIS — M79674 Pain in right toe(s): Secondary | ICD-10-CM | POA: Diagnosis not present

## 2024-04-24 MED ORDER — TERBINAFINE HCL 250 MG PO TABS: 250.0000 mg | ORAL_TABLET | Freq: Every day | ORAL | 1 refills | Status: AC

## 2024-04-24 NOTE — Progress Notes (Signed)
" ° °  Chief Complaint  Patient presents with   Nail Problem    Pt is here to f/u on bilateral toenails due to toenail fungus, she is not taking the medication anymore, see's some changes in the right side, was seeing some to the left foot but starting to look how it did when it began.    Subjective: 43 y.o. female presenting today for follow-up evaluation of toenail fungus bilateral.  Overall improvement  Past Medical History:  Diagnosis Date   Abnormal Pap smear    Anxiety    Depression    Family history of adverse reaction to anesthesia    sister difficult to wake   GERD (gastroesophageal reflux disease)    Gestational diabetes    Hypertension    h/o prior to pregnancy-pt has now delivered and bp is under control-Pt will ask Dr Lamar Lesches ob/gyn if she needs to restart this   Spinal headache    after epidural with section on 12-03-20-had blood patch and ha resolved   Tachycardia     Past Surgical History:  Procedure Laterality Date   CESAREAN SECTION N/A 12/03/2020   Procedure: CESAREAN SECTION;  Surgeon: Lesches Lamar SQUIBB, MD;  Location: [facility] ORS;  Service: Obstetrics;  Laterality: N/A;   LAPAROSCOPIC BILATERAL SALPINGECTOMY Bilateral 02/11/2021   Procedure: LAPAROSCOPIC BILATERAL SALPINGECTOMY;  Surgeon: Lesches Lamar SQUIBB, MD;  Location: [facility] ORS;  Service: Gynecology;  Laterality: Bilateral;   LEEP     PERIURETHRAL ABSCESS DRAINAGE     abscess removed from tonsils. Tonsils not removed   WISDOM TOOTH EXTRACTION      No Known Allergies   B/L toes 07/05/2023  Objective: Physical Exam General: The patient is alert and oriented x3 in no acute distress.  Dermatology: Improvement at the base of the nail plate.  Remaining nail hyperkeratotic, discolored, thickened, onychodystrophy noted. Skin is warm, dry and supple bilateral lower extremities. Negative for open lesions or macerations. Interdigital maceration also noted between the fourth and fifth digits bilateral  Vascular:  Palpable pedal pulses bilaterally. No edema or erythema noted. Capillary refill within normal limits.  Neurological: Grossly intact via light touch  Musculoskeletal Exam: No pedal deformity noted  Assessment: #1 Onychomycosis of toenails #2 Tinea pedis b/l   Plan of Care:  -Patient was evaluated.  Mechanical debridement of the bilateral great toenails was performed today using a nail nipper without incident or bleeding.  Sleeping with a rotary bur is courtesy for the patient. - Tolerating the oral Lamisil  well.  Refill prescription for Lamisil  250 mg #90 daily.  She has an appointment coming up with her PCP.  Blood work at that time.  If hepatic function is abnormal discontinue -Continue prescribed Lotrisone  cream PRN -Return to clinic 6 months  *Currently not working. She is an Dance Movement Psychotherapist though   Thresa EMERSON Sar, DPM [facility]  Dr. Thresa EMERSON Sar, DPM    [address] [place], [place] [address]                Office [phone]  Fax [phone]      "

## 2024-05-16 ENCOUNTER — Encounter: Payer: Self-pay | Admitting: Family Medicine

## 2024-05-16 ENCOUNTER — Other Ambulatory Visit: Payer: Self-pay | Admitting: Family Medicine

## 2024-05-22 ENCOUNTER — Ambulatory Visit (INDEPENDENT_AMBULATORY_CARE_PROVIDER_SITE_OTHER): Admitting: Family Medicine

## 2024-05-22 ENCOUNTER — Encounter: Payer: Self-pay | Admitting: Family Medicine

## 2024-05-22 VITALS — BP 154/94 | HR 96 | Ht 62.0 in | Wt 180.8 lb

## 2024-05-22 DIAGNOSIS — E66811 Obesity, class 1: Secondary | ICD-10-CM | POA: Diagnosis not present

## 2024-05-22 DIAGNOSIS — B351 Tinea unguium: Secondary | ICD-10-CM | POA: Diagnosis not present

## 2024-05-22 MED ORDER — WEGOVY 0.25 MG/0.5ML ~~LOC~~ SOAJ: 0.2500 mg | SUBCUTANEOUS | 0 refills | Status: AC

## 2024-05-22 NOTE — Progress Notes (Signed)
" ° °  Established Patient Office Visit  Subjective   Patient ID: Tami Foster, female    DOB: 29-Jul-1980  Age: 44 y.o. MRN: [id number]  Chief Complaint  Patient presents with   Medical Management of Chronic Issues     History of Present Illness   Tami Foster is a 44 year old female who presents with concerns about weight gain and medication management.  She reports a 12-pound weight gain over the past month with daily fluctuations up to three pounds, without clear increase in caloric intake. She is upset about the weight change.  She previously had gestational diabetes and attended nutrition counseling. She now usually eats one late meal daily, often due to her husband's work schedule. Meals are mainly home-cooked chicken and pork. She rarely eats sweets but has occasional cravings.  She walks and plays with her children for activity but has reduced this due to feeling winded and concern about busy roads. She is trying to quit smoking and finds this difficult.  She is taking terbinafine  on a three-month course and needs liver function testing for monitoring. She recently had insurance coverage reinstated, which had limited her access to medications.          The 10-year ASCVD risk score (Arnett DK, et al., 2019) is: 3%  Health Maintenance Due  Topic Date Due   Hepatitis C Screening  Never done   Pneumococcal Vaccine (1 of 2 - PCV) Never done   Hepatitis B Vaccines 19-59 Average Risk (1 of 3 - 19+ 3-dose series) Never done   HPV VACCINES (1 - Risk 3-dose SCDM series) Never done   Mammogram  Never done   COVID-19 Vaccine (1 - 2025-26 season) Never done      Objective:     BP (!) 143/99   Pulse 96   Ht 5' 2 (1.575 m)   Wt 180 lb 12.8 oz (82 kg)   SpO2 98%   BMI 33.07 kg/m    Physical Exam   MEASUREMENTS: Weight- 180.     Gen: alert, oriented Pulm: no respiratory distress Psych: pleasant affect   No results found for any visits on 05/22/24.       Assessment & Plan:   Onychomycosis of great toe Assessment & Plan: Requires liver function monitoring. - Continue terbinafine  for three months. - Schedule liver function test.  Orders: -     Comprehensive metabolic panel with GFR; Future  Obesity, Class I, BMI 30-34.9 Assessment & Plan: Weight gain likely due to discontinuation of weight loss medication. Discussed caloric intake, expenditure, and weight fluctuations. Explored intermittent fasting and dietary options. Addressed insurance coverage for weight loss programs. - Resent prescription for weight loss medication and initiated prior authorization. - Discussed intermittent fasting as a weight loss strategy. - Provided information on [facility] for online weight management support. - Advised on portion control and caloric intake monitoring.   Other orders -     Wegovy ; Inject 0.25 mg into the skin once a week.  Dispense: 2 mL; Refill: 0       Return in about 4 months (around 09/19/2024) for weight.    Toribio MARLA Slain, MD  "

## 2024-05-22 NOTE — Patient Instructions (Signed)
" °  VISIT SUMMARY: During your visit, we discussed your recent weight gain and medication management. We explored dietary options and strategies for weight loss, and addressed the need for liver function monitoring due to your current medication.  YOUR PLAN: OBESITY: Your recent weight gain is likely due to stopping your weight loss medication. We discussed various dietary options and strategies to help manage your weight. -Resent prescription for weight loss medication and initiated prior authorization. -Discussed intermittent fasting as a weight loss strategy. -Provided information on [facility] for online weight management support. -Advised on portion control and caloric intake monitoring.  ONYCHOMYCOSIS OF GREAT TOE: You are currently taking terbinafine  for your toenail infection, which requires liver function monitoring. -Continue taking terbinafine  for three months. -Schedule a liver function test.    "

## 2024-05-22 NOTE — Assessment & Plan Note (Signed)
 Requires liver function monitoring. - Continue terbinafine  for three months. - Schedule liver function test.

## 2024-05-22 NOTE — Assessment & Plan Note (Signed)
 Weight gain likely due to discontinuation of weight loss medication. Discussed caloric intake, expenditure, and weight fluctuations. Explored intermittent fasting and dietary options. Addressed insurance coverage for weight loss programs. - Resent prescription for weight loss medication and initiated prior authorization. - Discussed intermittent fasting as a weight loss strategy. - Provided information on [facility] for online weight management support. - Advised on portion control and caloric intake monitoring.

## 2024-05-27 ENCOUNTER — Other Ambulatory Visit

## 2024-05-28 ENCOUNTER — Other Ambulatory Visit (HOSPITAL_COMMUNITY): Payer: Self-pay

## 2024-05-28 ENCOUNTER — Other Ambulatory Visit

## 2024-05-28 ENCOUNTER — Ambulatory Visit: Payer: Self-pay

## 2024-05-28 LAB — COMPREHENSIVE METABOLIC PANEL WITH GFR
ALT: 21 IU/L (ref 0–32)
AST: 19 IU/L (ref 0–40)
Albumin: 3.8 g/dL — ABNORMAL LOW (ref 3.9–4.9)
Alkaline Phosphatase: 94 IU/L (ref 41–116)
BUN/Creatinine Ratio: 25 — ABNORMAL HIGH (ref 9–23)
BUN: 15 mg/dL (ref 6–24)
Bilirubin Total: 0.3 mg/dL (ref 0.0–1.2)
CO2: 20 mmol/L (ref 20–29)
Calcium: 9.2 mg/dL (ref 8.7–10.2)
Chloride: 104 mmol/L (ref 96–106)
Creatinine, Ser: 0.6 mg/dL (ref 0.57–1.00)
Globulin, Total: 2.6 g/dL (ref 1.5–4.5)
Glucose: 105 mg/dL — ABNORMAL HIGH (ref 70–99)
Potassium: 4.2 mmol/L (ref 3.5–5.2)
Sodium: 138 mmol/L (ref 134–144)
Total Protein: 6.4 g/dL (ref 6.0–8.5)
eGFR: 116 mL/min/1.73

## 2024-06-06 ENCOUNTER — Encounter: Payer: Self-pay | Admitting: Family Medicine

## 2024-06-11 ENCOUNTER — Telehealth: Payer: Self-pay

## 2024-06-11 ENCOUNTER — Other Ambulatory Visit (HOSPITAL_COMMUNITY): Payer: Self-pay

## 2024-06-11 NOTE — Telephone Encounter (Signed)
 Pharmacy Patient Advocate Encounter   Received notification from CoverMyMeds that prior authorization for Wegovy  0.25mg /0.71ml is required/requested.   Insurance verification completed.   The patient is insured through [facility] MEDICAID.   Per test claim: PA required; PA submitted to above mentioned insurance via Latent Key/confirmation #/EOC [facility] Status is pending

## 2024-06-12 NOTE — Telephone Encounter (Signed)
 Pharmacy Patient Advocate Encounter  Received notification from [employer] that Prior Authorization for Wegovy  0.25mg /0.34ml has been DENIED.  See denial reason below. No denial letter attached in CMM. Will attach denial letter to Media tab once received.   PA #/Case ID/Reference #: [id number]

## 2024-09-19 ENCOUNTER — Ambulatory Visit: Admitting: Family Medicine

## 2024-10-23 ENCOUNTER — Ambulatory Visit: Admitting: Podiatry
